# Patient Record
Sex: Male | Born: 1940
Health system: Southern US, Community
[De-identification: ages and names within clinical notes are randomized; demographics above are authoritative.]

## PROBLEM LIST (undated history)

## (undated) DIAGNOSIS — G4730 Sleep apnea, unspecified: Secondary | ICD-10-CM

## (undated) DIAGNOSIS — I509 Heart failure, unspecified: Secondary | ICD-10-CM

## (undated) DIAGNOSIS — I10 Essential (primary) hypertension: Secondary | ICD-10-CM

## (undated) DIAGNOSIS — M1990 Unspecified osteoarthritis, unspecified site: Secondary | ICD-10-CM

## (undated) DIAGNOSIS — Z5189 Encounter for other specified aftercare: Secondary | ICD-10-CM

## (undated) DIAGNOSIS — I5032 Chronic diastolic (congestive) heart failure: Secondary | ICD-10-CM

## (undated) DIAGNOSIS — I517 Cardiomegaly: Secondary | ICD-10-CM

## (undated) DIAGNOSIS — Z860100 Personal history of colon polyps, unspecified: Secondary | ICD-10-CM

## (undated) DIAGNOSIS — Z9989 Dependence on other enabling machines and devices: Secondary | ICD-10-CM

## (undated) DIAGNOSIS — K649 Unspecified hemorrhoids: Secondary | ICD-10-CM

## (undated) DIAGNOSIS — M2619 Other specified anomalies of jaw-cranial base relationship: Secondary | ICD-10-CM

## (undated) DIAGNOSIS — K219 Gastro-esophageal reflux disease without esophagitis: Secondary | ICD-10-CM

## (undated) DIAGNOSIS — N529 Male erectile dysfunction, unspecified: Secondary | ICD-10-CM

## (undated) DIAGNOSIS — M169 Osteoarthritis of hip, unspecified: Secondary | ICD-10-CM

## (undated) DIAGNOSIS — G4733 Obstructive sleep apnea (adult) (pediatric): Secondary | ICD-10-CM

## (undated) HISTORY — DX: Obstructive sleep apnea (adult) (pediatric): G47.33

## (undated) HISTORY — DX: Cardiomegaly: I51.7

## (undated) HISTORY — PX: COLONOSCOPY: SHX174

## (undated) HISTORY — DX: Chronic diastolic (congestive) heart failure: I50.32

## (undated) HISTORY — DX: Heart failure, unspecified: I50.9

## (undated) HISTORY — DX: Morbid (severe) obesity due to excess calories: E66.01

## (undated) HISTORY — DX: Encounter for other specified aftercare: Z51.89

## (undated) HISTORY — DX: Male erectile dysfunction, unspecified: N52.9

## (undated) HISTORY — DX: Dependence on other enabling machines and devices: Z99.89

## (undated) HISTORY — PX: JOINT REPLACEMENT: SHX530

## (undated) HISTORY — PX: KNEE ARTHROSCOPY: SUR90

## (undated) HISTORY — DX: Unspecified hemorrhoids: K64.9

## (undated) HISTORY — DX: Other specified anomalies of jaw-cranial base relationship: M26.19

## (undated) HISTORY — DX: Gastro-esophageal reflux disease without esophagitis: K21.9

## (undated) HISTORY — DX: Osteoarthritis of hip, unspecified: M16.9

---

## 1999-03-03 ENCOUNTER — Encounter: Admission: RE | Admit: 1999-03-03 | Discharge: 1999-03-03 | Payer: Self-pay | Admitting: Family Medicine

## 1999-03-03 ENCOUNTER — Ambulatory Visit (HOSPITAL_COMMUNITY): Admission: RE | Admit: 1999-03-03 | Discharge: 1999-03-03 | Payer: Self-pay | Admitting: Family Medicine

## 1999-03-11 ENCOUNTER — Encounter: Admission: RE | Admit: 1999-03-11 | Discharge: 1999-03-11 | Payer: Self-pay | Admitting: *Deleted

## 1999-03-25 ENCOUNTER — Ambulatory Visit (HOSPITAL_COMMUNITY): Admission: RE | Admit: 1999-03-25 | Discharge: 1999-03-25 | Payer: Self-pay | Admitting: *Deleted

## 1999-03-25 ENCOUNTER — Encounter: Admission: RE | Admit: 1999-03-25 | Discharge: 1999-03-25 | Payer: Self-pay | Admitting: Family Medicine

## 1999-06-02 ENCOUNTER — Encounter: Admission: RE | Admit: 1999-06-02 | Discharge: 1999-06-02 | Payer: Self-pay | Admitting: Family Medicine

## 2000-03-13 ENCOUNTER — Encounter: Payer: Self-pay | Admitting: Emergency Medicine

## 2000-03-13 ENCOUNTER — Emergency Department (HOSPITAL_COMMUNITY): Admission: EM | Admit: 2000-03-13 | Discharge: 2000-03-13 | Payer: Self-pay | Admitting: Emergency Medicine

## 2000-03-22 ENCOUNTER — Encounter: Payer: Self-pay | Admitting: Specialist

## 2000-03-22 ENCOUNTER — Ambulatory Visit (HOSPITAL_COMMUNITY): Admission: RE | Admit: 2000-03-22 | Discharge: 2000-03-22 | Payer: Self-pay | Admitting: Specialist

## 2000-06-16 ENCOUNTER — Encounter: Admission: RE | Admit: 2000-06-16 | Discharge: 2000-06-16 | Payer: Self-pay | Admitting: Family Medicine

## 2000-09-29 ENCOUNTER — Encounter: Admission: RE | Admit: 2000-09-29 | Discharge: 2000-09-29 | Payer: Self-pay | Admitting: Family Medicine

## 2000-10-19 ENCOUNTER — Ambulatory Visit (HOSPITAL_COMMUNITY): Admission: RE | Admit: 2000-10-19 | Discharge: 2000-10-19 | Payer: Self-pay | Admitting: *Deleted

## 2000-11-01 ENCOUNTER — Encounter: Admission: RE | Admit: 2000-11-01 | Discharge: 2000-11-01 | Payer: Self-pay | Admitting: Family Medicine

## 2000-11-16 ENCOUNTER — Encounter: Admission: RE | Admit: 2000-11-16 | Discharge: 2000-11-16 | Payer: Self-pay | Admitting: Family Medicine

## 2000-11-22 ENCOUNTER — Encounter: Admission: RE | Admit: 2000-11-22 | Discharge: 2000-11-22 | Payer: Self-pay | Admitting: Family Medicine

## 2000-12-14 ENCOUNTER — Encounter: Admission: RE | Admit: 2000-12-14 | Discharge: 2000-12-14 | Payer: Self-pay | Admitting: Family Medicine

## 2001-04-12 ENCOUNTER — Encounter: Admission: RE | Admit: 2001-04-12 | Discharge: 2001-04-12 | Payer: Self-pay | Admitting: Family Medicine

## 2003-05-08 ENCOUNTER — Encounter (INDEPENDENT_AMBULATORY_CARE_PROVIDER_SITE_OTHER): Payer: Self-pay | Admitting: Specialist

## 2003-05-08 ENCOUNTER — Ambulatory Visit (HOSPITAL_COMMUNITY): Admission: RE | Admit: 2003-05-08 | Discharge: 2003-05-08 | Payer: Self-pay | Admitting: Gastroenterology

## 2006-10-13 DIAGNOSIS — G4730 Sleep apnea, unspecified: Secondary | ICD-10-CM | POA: Insufficient documentation

## 2006-10-13 DIAGNOSIS — N529 Male erectile dysfunction, unspecified: Secondary | ICD-10-CM

## 2006-10-13 DIAGNOSIS — I5032 Chronic diastolic (congestive) heart failure: Secondary | ICD-10-CM | POA: Insufficient documentation

## 2006-10-13 DIAGNOSIS — I517 Cardiomegaly: Secondary | ICD-10-CM | POA: Insufficient documentation

## 2006-10-13 DIAGNOSIS — I10 Essential (primary) hypertension: Secondary | ICD-10-CM | POA: Insufficient documentation

## 2006-10-13 DIAGNOSIS — K219 Gastro-esophageal reflux disease without esophagitis: Secondary | ICD-10-CM

## 2006-10-13 DIAGNOSIS — E669 Obesity, unspecified: Secondary | ICD-10-CM | POA: Insufficient documentation

## 2006-10-13 DIAGNOSIS — K649 Unspecified hemorrhoids: Secondary | ICD-10-CM

## 2006-10-13 HISTORY — DX: Unspecified hemorrhoids: K64.9

## 2006-10-13 HISTORY — DX: Male erectile dysfunction, unspecified: N52.9

## 2006-10-13 HISTORY — DX: Cardiomegaly: I51.7

## 2006-10-13 HISTORY — DX: Chronic diastolic (congestive) heart failure: I50.32

## 2006-10-13 HISTORY — DX: Gastro-esophageal reflux disease without esophagitis: K21.9

## 2010-05-06 ENCOUNTER — Encounter: Admission: RE | Admit: 2010-05-06 | Discharge: 2010-05-06 | Payer: Self-pay | Admitting: Internal Medicine

## 2011-01-01 NOTE — Op Note (Signed)
NAME:  Kevin Weiss, SPADAFORE                       ACCOUNT NO.:  [id number]   MEDICAL RECORD NO.:  [id number]                   PATIENT TYPE:  AMB   LOCATION:  ENDO                                 FACILITY:  [facility]   PHYSICIAN:  Anselmo Rod, M.D.               DATE OF BIRTH:  [birth year]   DATE OF PROCEDURE:  05/08/2003  DATE OF DISCHARGE:                                 OPERATIVE REPORT   PROCEDURE PERFORMED:  Colonoscopy with snare polypectomy x1.   ENDOSCOPIST:  Anselmo Rod, M.D.   INSTRUMENT USED:  Olympus video colonoscope.   INDICATION FOR PROCEDURE:  One episode of rectal bleeding and a history of  chronic constipation in a 70 year old African-American male with a history  of hypertension.  Rule out colonic polyps, masses, etc.   PREPROCEDURE PREPARATION:  Informed consent was procured from the patient.  The patient was fasted for eight hours prior to the procedure and prepped  with a bottle of magnesium citrate and a gallon of GoLYTELY the night prior  to the procedure.   PREPROCEDURE PHYSICAL:  VITAL SIGNS:  The patient had stable vital signs.  NECK:  Supple.  CHEST:  Clear to auscultation.  S1, S2 regular.  ABDOMEN:  Soft with normal bowel sounds.   DESCRIPTION OF PROCEDURE:  The patient was placed in the left lateral  decubitus position and sedated with 75 mg of Demerol and 7.5 mg of Versed  intravenously.  Once the patient was adequately sedate and maintained on low-  flow oxygen and continuous cardiac monitoring, the Olympus video colonoscope  was advanced from the rectum to the cecum without difficulty.  The  appendiceal orifice and the ileocecal valve were clearly visualized and  photographed.  The terminal ileum appeared normal.  A small sessile polyp  was snared from 60 cm.  Retroflexion in the rectum revealed a small internal  hemorrhoid.  No other abnormalities were noted.  There was no evidence of  diverticulosis.  The patient tolerated the procedure  well without  complications.   IMPRESSION:  1. Small internal hemorrhoid.  2. Small sessile polyp snared at 60 cm.  3. Otherwise normal colonoscopy up to the terminal ileum.   RECOMMENDATIONS:  1. Await pathology results.  2. Continue a high-fiber diet with liberal fluid intake.  3. Avoid nonsteroidals, including aspirin, for the next four weeks.  4. Workup for sleep apnea syndrome as the patient had some drop in his     oxygen saturation during the procedure.  5.     Resume antihypertensive medication.  The patient hypertensive with a blood     pressure of 200/140 when we first started the procedure.  6. Outpatient follow-up in the next two weeks for further recommendations.  Anselmo Rod, M.D.    JNM/MEDQ  D:  05/08/2003  T:  05/08/2003  Job:  [id number]   cc:   Olene Craven, M.D.  [address]  [address]  [place]  [place] [address]  Fax: [phone]

## 2014-01-29 ENCOUNTER — Other Ambulatory Visit: Payer: Self-pay | Admitting: Orthopaedic Surgery

## 2014-02-27 ENCOUNTER — Encounter (HOSPITAL_COMMUNITY): Payer: Self-pay | Admitting: Pharmacy Technician

## 2014-03-01 ENCOUNTER — Ambulatory Visit (HOSPITAL_COMMUNITY)
Admission: RE | Admit: 2014-03-01 | Discharge: 2014-03-01 | Disposition: A | Discharge status: Home / Self Care | Payer: Medicare Other | Source: Ambulatory Visit | Attending: Orthopaedic Surgery | Admitting: Orthopaedic Surgery

## 2014-03-01 ENCOUNTER — Encounter (HOSPITAL_COMMUNITY): Payer: Self-pay

## 2014-03-01 ENCOUNTER — Encounter (HOSPITAL_COMMUNITY)
Admission: RE | Admit: 2014-03-01 | Discharge: 2014-03-01 | Disposition: A | Discharge status: Home / Self Care | Payer: Medicare Other | Source: Ambulatory Visit | Attending: Orthopaedic Surgery | Admitting: Orthopaedic Surgery

## 2014-03-01 DIAGNOSIS — Z01818 Encounter for other preprocedural examination: Secondary | ICD-10-CM | POA: Insufficient documentation

## 2014-03-01 HISTORY — DX: Essential (primary) hypertension: I10

## 2014-03-01 HISTORY — DX: Personal history of colon polyps, unspecified: Z86.0100

## 2014-03-01 HISTORY — DX: Sleep apnea, unspecified: G47.30

## 2014-03-01 HISTORY — DX: Unspecified osteoarthritis, unspecified site: M19.90

## 2014-03-01 HISTORY — DX: Personal history of colonic polyps: Z86.010

## 2014-03-01 LAB — URINALYSIS, ROUTINE W REFLEX MICROSCOPIC
Bilirubin Urine: NEGATIVE
Glucose, UA: NEGATIVE mg/dL
HGB URINE DIPSTICK: NEGATIVE
Ketones, ur: 15 mg/dL — AB
NITRITE: NEGATIVE
PROTEIN: NEGATIVE mg/dL
Specific Gravity, Urine: 1.029 (ref 1.005–1.030)
Urobilinogen, UA: 0.2 mg/dL (ref 0.0–1.0)
pH: 5 (ref 5.0–8.0)

## 2014-03-01 LAB — PROTIME-INR
INR: 1.02 (ref 0.00–1.49)
Prothrombin Time: 13.4 seconds (ref 11.6–15.2)

## 2014-03-01 LAB — CBC WITH DIFFERENTIAL/PLATELET
BASOS ABS: 0 10*3/uL (ref 0.0–0.1)
Basophils Relative: 0 % (ref 0–1)
EOS PCT: 6 % — AB (ref 0–5)
Eosinophils Absolute: 0.3 10*3/uL (ref 0.0–0.7)
HEMATOCRIT: 43.1 % (ref 39.0–52.0)
HEMOGLOBIN: 14.8 g/dL (ref 13.0–17.0)
LYMPHS PCT: 41 % (ref 12–46)
Lymphs Abs: 2.3 10*3/uL (ref 0.7–4.0)
MCH: 29.1 pg (ref 26.0–34.0)
MCHC: 34.3 g/dL (ref 30.0–36.0)
MCV: 84.8 fL (ref 78.0–100.0)
MONO ABS: 0.5 10*3/uL (ref 0.1–1.0)
MONOS PCT: 10 % (ref 3–12)
NEUTROS ABS: 2.4 10*3/uL (ref 1.7–7.7)
Neutrophils Relative %: 43 % (ref 43–77)
Platelets: 201 10*3/uL (ref 150–400)
RBC: 5.08 MIL/uL (ref 4.22–5.81)
RDW: 14.6 % (ref 11.5–15.5)
WBC: 5.5 10*3/uL (ref 4.0–10.5)

## 2014-03-01 LAB — BASIC METABOLIC PANEL
Anion gap: 15 (ref 5–15)
BUN: 14 mg/dL (ref 6–23)
CHLORIDE: 97 meq/L (ref 96–112)
CO2: 25 meq/L (ref 19–32)
CREATININE: 1.13 mg/dL (ref 0.50–1.35)
Calcium: 9.7 mg/dL (ref 8.4–10.5)
GFR calc Af Amer: 74 mL/min — ABNORMAL LOW (ref >90)
GFR calc non Af Amer: 64 mL/min — ABNORMAL LOW (ref >90)
Glucose, Bld: 97 mg/dL (ref 70–99)
Potassium: 4.2 mEq/L (ref 3.7–5.3)
Sodium: 137 mEq/L (ref 137–147)

## 2014-03-01 LAB — SURGICAL PCR SCREEN
MRSA, PCR: NEGATIVE
Staphylococcus aureus: NEGATIVE

## 2014-03-01 LAB — APTT: aPTT: 28 seconds (ref 24–37)

## 2014-03-01 LAB — URINE MICROSCOPIC-ADD ON

## 2014-03-01 LAB — TYPE AND SCREEN
ABO/RH(D): B POS
ANTIBODY SCREEN: NEGATIVE

## 2014-03-01 LAB — ABO/RH: ABO/RH(D): B POS

## 2014-03-01 IMAGING — CR DG CHEST 2V
2 series · 2 of 2 positions shown · non-contrast
Comparison: None.

CLINICAL DATA: Preoperative evaluation for hip replacement

EXAM:
CHEST  2 VIEW

[w chest pa]
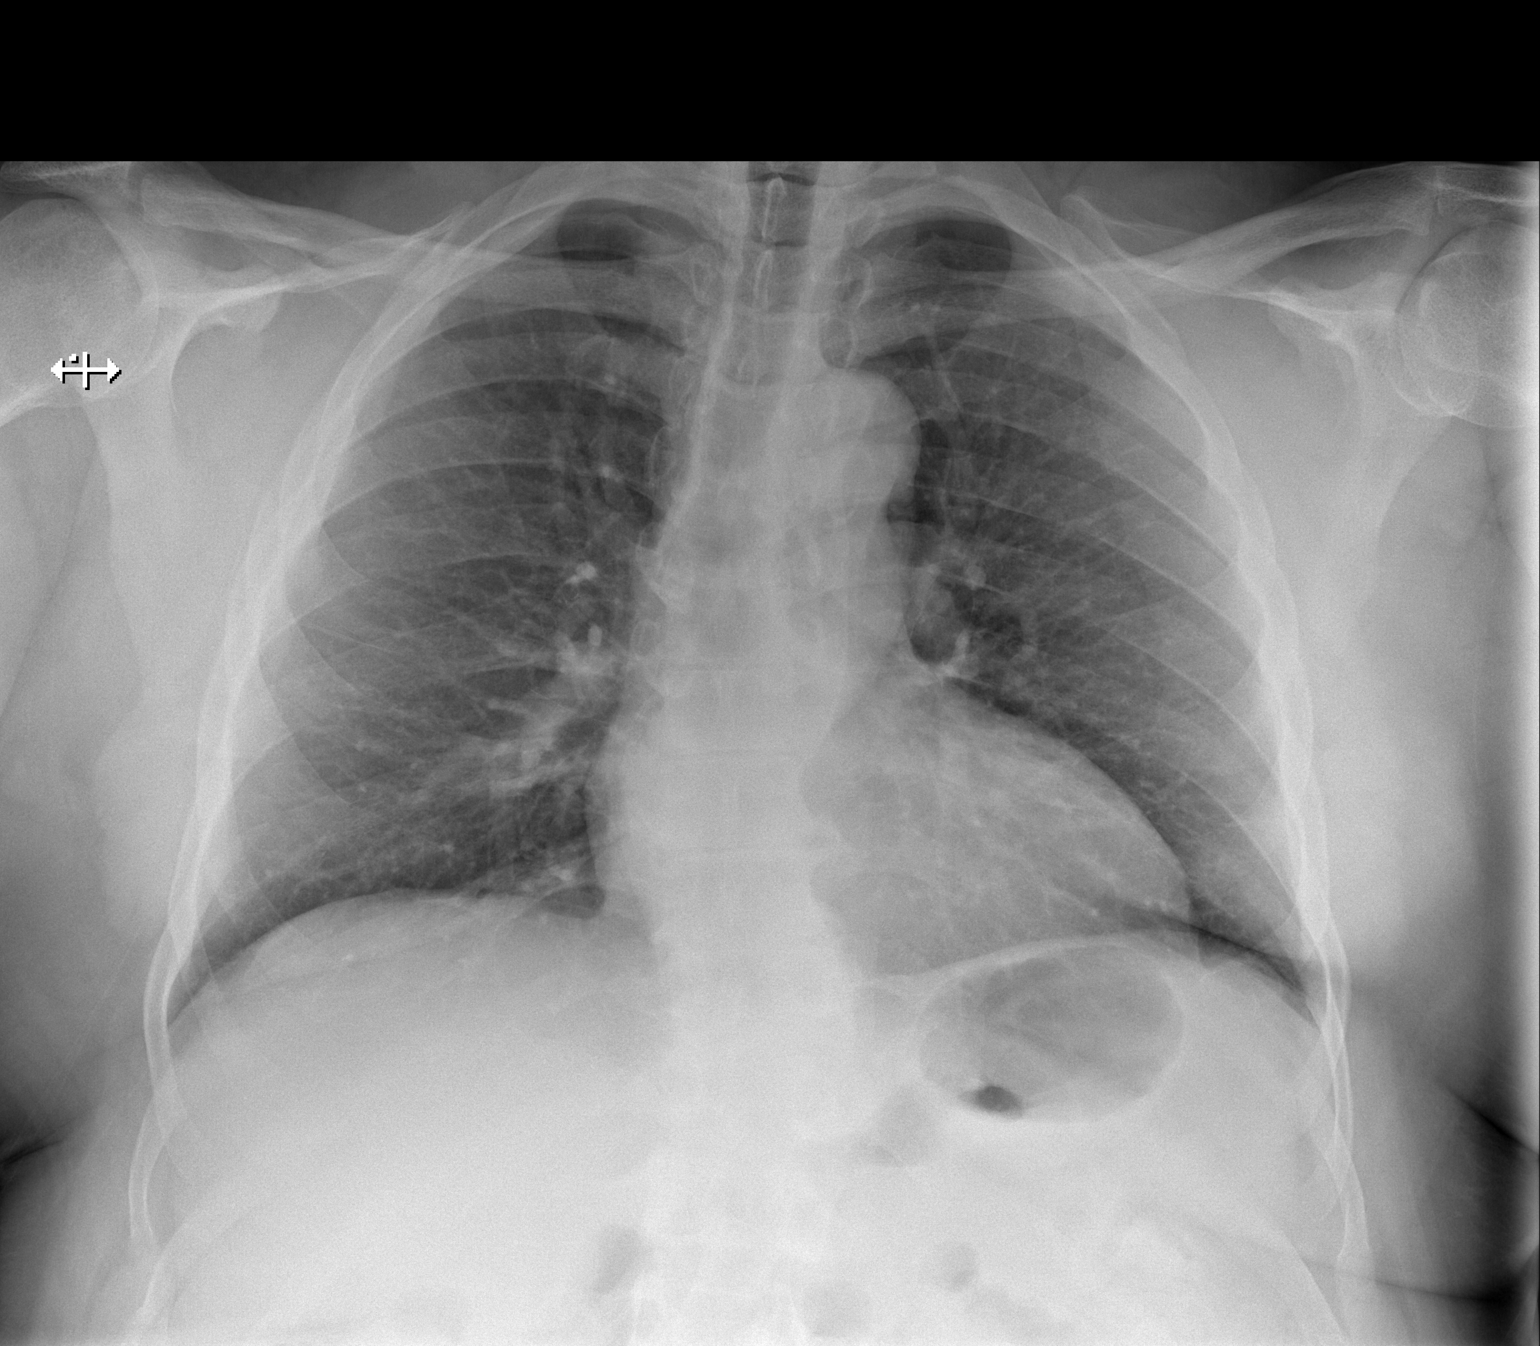

[w chest lat]
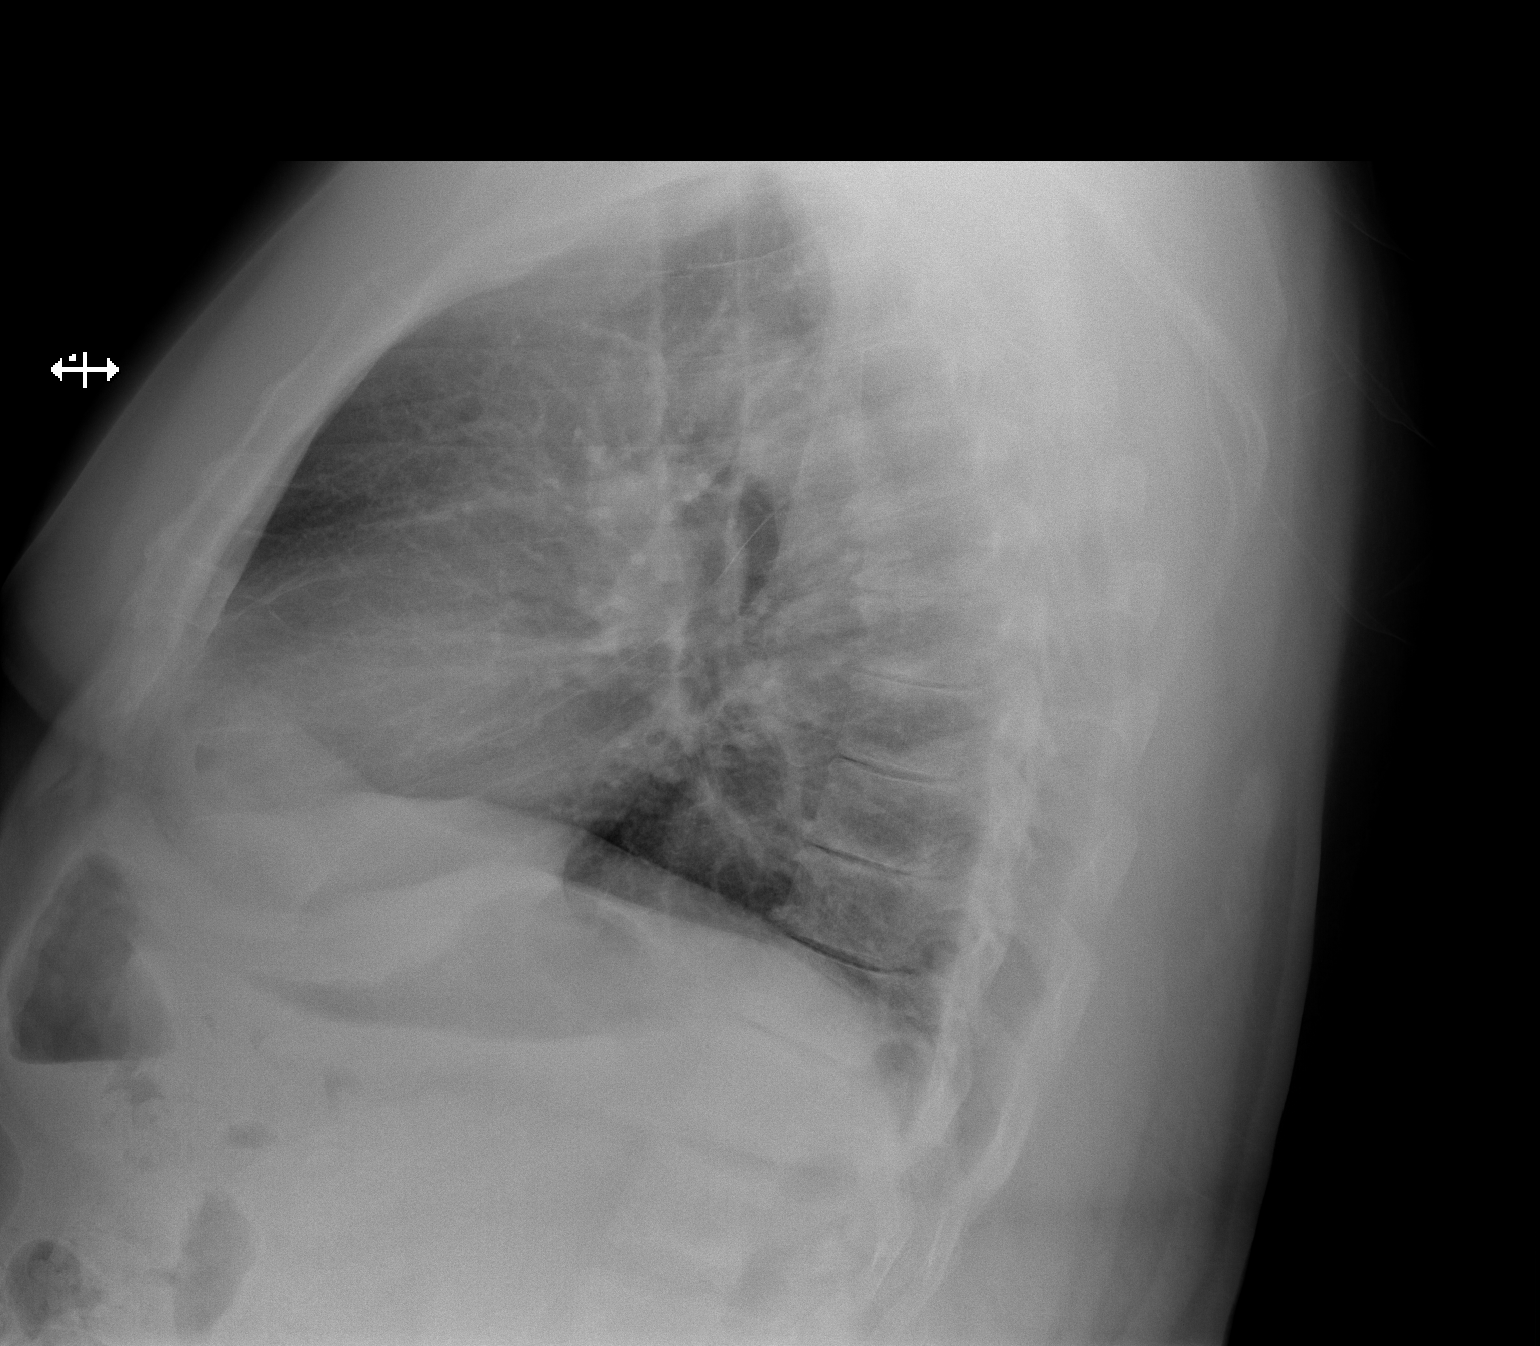

[2 of 2 positions shown; findings below may reference images not displayed]

FINDINGS: The heart size and mediastinal contours are within normal limits.
Both lungs are clear. The visualized skeletal structures are
unremarkable.
IMPRESSION: No active cardiopulmonary disease.

## 2014-03-01 MED ORDER — CHLORHEXIDINE GLUCONATE 4 % EX LIQD: 60.0000 mL | Freq: Once | CUTANEOUS | Status: DC

## 2014-03-01 NOTE — Pre-Procedure Instructions (Signed)
Kevin Weiss  03/01/2014   Your procedure is scheduled on:  Tues, July 28 @ 7:30 AM  Report to [facility]  at 5:30 AM.  Call this number if you have problems the morning of surgery: [phone]   Remember:   Do not eat food or drink liquids after midnight.   Take these medicines the morning of surgery with A SIP OF WATER: Amlodipine(Norvasc)               Stop taking your Aspirin a week before surgery. No Goody's,BC's,Aleve,Ibuprofen,Fish Oil,or any Herbal Medications   Do not wear jewelry  Do not wear lotions, powders, or colognes. You may wear deodorant.  Men may shave face and neck.  Do not bring valuables to the hospital.  [facility] is not responsible                  for any belongings or valuables.               Contacts, dentures or bridgework may not be worn into surgery.  Leave suitcase in the car. After surgery it may be brought to your room.  For patients admitted to the hospital, discharge time is determined by your                treatment team.               Special Instructions:  [place] - Preparing for Surgery  Before surgery, you can play an important role.  Because skin is not sterile, your skin needs to be as free of germs as possible.  You can reduce the number of germs on you skin by washing with CHG (chlorahexidine gluconate) soap before surgery.  CHG is an antiseptic cleaner which kills germs and bonds with the skin to continue killing germs even after washing.  Please DO NOT use if you have an allergy to CHG or antibacterial soaps.  If your skin becomes reddened/irritated stop using the CHG and inform your nurse when you arrive at Short Stay.  Do not shave (including legs and underarms) for at least 48 hours prior to the first CHG shower.  You may shave your face.  Please follow these instructions carefully:   1.  Shower with CHG Soap the night before surgery and the                                morning of Surgery.  2.  If you choose  to wash your hair, wash your hair first as usual with your       normal shampoo.  3.  After you shampoo, rinse your hair and body thoroughly to remove the                      Shampoo.  4.  Use CHG as you would any other liquid soap.  You can apply chg directly       to the skin and wash gently with scrungie or a clean washcloth.  5.  Apply the CHG Soap to your body ONLY FROM THE NECK DOWN.        Do not use on open wounds or open sores.  Avoid contact with your eyes,       ears, mouth and genitals (private parts).  Wash genitals (private parts)       with your normal soap.  6.  Wash thoroughly, paying special attention to the area where your surgery        will be performed.  7.  Thoroughly rinse your body with warm water from the neck down.  8.  DO NOT shower/wash with your normal soap after using and rinsing off       the CHG Soap.  9.  Pat yourself dry with a clean towel.            10.  Wear clean pajamas.            11.  Place clean sheets on your bed the night of your first shower and do not        sleep with pets.  Day of Surgery  Do not apply any lotions/deoderants the morning of surgery.  Please wear clean clothes to the hospital/surgery center.     Please read over the following fact sheets that you were given: Pain Booklet, Coughing and Deep Breathing, Blood Transfusion Information, MRSA Information and Surgical Site Infection Prevention

## 2014-03-01 NOTE — Progress Notes (Signed)
Sleep study done > 29yrs ago

## 2014-03-01 NOTE — Progress Notes (Signed)
Cardiologist is Dr.Gangi with last visit over a yr ago-to request report   Stress test done a little over a yr ago-to request from [facility]  Echo to also be requested from [facility]  Denies ever having heart cath  Denies ekg or cxr in past yr  Medical Md is Dr.Robyn [facility]

## 2014-03-01 NOTE — Progress Notes (Signed)
Anesthesia Chart Review:  Patient is a 73 year old male scheduled for right THA, anterior on 03/12/14 by Dr. Rhona Raider.  History includes non-smoker, OSA, HTN, murmur of infancy, colon polyps, arthritis. BMI is consistent with obesity. PCP is Dr. Glendale Chard.  He was evaluated by cardiologist Dr. Einar Gip in 2013 for chest pain. His ETT was negative.  Echo showed normal LVF, grade 1 diastolic dysfunction, mild to moderate pulmonary hypertension.  With BP control and weight loss, his symptoms resolved, and on 03/09/12 Dr. Einar Gip recommended PRN cardiology follow-up with continued primary prevention.  EKG on 03/01/14 showed: NSR, right BBB, LAFB, bifascicular block, LVH.  His EKG appears stable when compared to prior tracing on 12/15/2011 [facility] CV).    Exercise stress test on 01/04/12 [facility] CV) was negative for ischemia, normal BP response. Aggressive risk modification recommended.   Echo on 12/23/11 [facility] CV) showed: LV cavity is normal in size, diastolic filling with impaired relaxation pattern and normal to low pressure. Normal global wall motion. Normal LV systolic function, EF 16%. Grade I (impaired) diastolic dysfunction. Mild TR.  Mild to moderate pulmonary hypertension. RVSP 43.1 mmHg.  According to Dr. Irven Shelling notes, [facility] 11/15/11: [facility] CAD gene expression score of 21. Likelihood of CAD 32%.   Preoperative CXR and labs noted.   His EKG appears stable since at least 12/2011.  He had a cardiology evaluation at that time as outlined above and PRN follow-up was recommended.  If no acute changes then I would anticipate that he could proceed as planned.   [facility]/Anesthesiology Phone [phone] 03/01/2014 2:50 PM

## 2014-03-08 NOTE — H&P (Signed)
TOTAL HIP ADMISSION H&P  Patient is admitted for right total hip arthroplasty.  Subjective:  Chief Complaint: right hip pain  HPI: Kevin Weiss, 73 y.o. male, has a history of pain and functional disability in the right hip(s) due to arthritis and patient has failed non-surgical conservative treatments for greater than 12 weeks to include NSAID's and/or analgesics, flexibility and strengthening excercises, use of assistive devices, weight reduction as appropriate and activity modification.  Onset of symptoms was gradual starting 5 years ago with gradually worsening course since that time.The patient noted no past surgery on the right hip(s).  Patient currently rates pain in the right hip at 10 out of 10 with activity. Patient has night pain, worsening of pain with activity and weight bearing, trendelenberg gait, pain that interfers with activities of daily living and pain with passive range of motion. Patient has evidence of subchondral cysts, periarticular osteophytes and joint space narrowing by imaging studies. This condition presents safety issues increasing the risk of falls. This patient has had no.  There is no current active infection.  Patient Active Problem List   Diagnosis Date Noted  . OBESITY, NOS 10/13/2006  . HYPERTENSION, BENIGN SYSTEMIC 10/13/2006  . CHF, EJECTION FRACTION > OR = 50% 10/13/2006  . CARDIOMEGALY 10/13/2006  . HEMORRHOIDS, NOS 10/13/2006  . GASTROESOPHAGEAL REFLUX, NO ESOPHAGITIS 10/13/2006  . 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 APNEA, SLEEP 10/13/2006   Past Medical History  Diagnosis Date  . Hypertension     takes Amlodipine and Prinizide daily  . Heart murmur at birth   . Arthritis   . Joint pain   . History of colon polyps   . Sleep apnea     Past Surgical History  Procedure Laterality Date  . Knee arthroscopy Left 15+yrs ago  . Colonoscopy      No prescriptions prior to admission   No Known Allergies  History  Substance Use Topics  .  Smoking status: Never Smoker   . Smokeless tobacco: Not on file  . Alcohol Use: Yes     Comment: rarely    No family history on file.   Review of Systems  Constitutional: Negative.   HENT: Negative.   Eyes: Negative.   Respiratory: Negative.   Cardiovascular: Negative.   Gastrointestinal: Negative.   Genitourinary: Negative.   Musculoskeletal: Positive for joint pain.  Skin: Negative.   Neurological: Negative.   Endo/Heme/Allergies: Negative.   Psychiatric/Behavioral: Negative.     Objective:  Physical Exam  Constitutional: He appears well-developed.  HENT:  Head: Normocephalic.  Eyes: Pupils are equal, round, and reactive to light.  Neck: Normal range of motion.  Cardiovascular: Normal rate.   Respiratory: Effort normal.  GI: Soft.  Musculoskeletal:  Right hip motion extremely limited and painful.  Moderate pain and noted forward flexion.  Internal rotation very limited and painful.  Good neurovascular status otherwise negative Homans.  Neurological: He is alert.  Skin: Skin is warm.  Psychiatric: He has a normal mood and affect.    Vital signs in last 24 hours:    Labs:   There is no height or weight on file to calculate BMI.   Imaging Review Plain radiographs demonstrate severe degenerative joint disease of the right hip(s). The bone quality appears to be good for age and reported activity level.  Assessment/Plan:  End stage arthritis, right hip(s)  The patient history, physical examination, clinical judgement of the provider and imaging studies are consistent with end stage degenerative joint disease of the  right hip(s) and total hip arthroplasty is deemed medically necessary. The treatment options including medical management, injection therapy, arthroscopy and arthroplasty were discussed at length. The risks and benefits of total hip arthroplasty were presented and reviewed. The risks due to aseptic loosening, infection, stiffness,  dislocation/subluxation,  thromboembolic complications and other imponderables were discussed.  The patient acknowledged the explanation, agreed to proceed with the plan and consent was signed. Patient is being admitted for inpatient treatment for surgery, pain control, PT, OT, prophylactic antibiotics, VTE prophylaxis, progressive ambulation and ADL's and discharge planning.The patient is planning to be discharged home with home health services

## 2014-03-11 MED ORDER — CEFAZOLIN SODIUM-DEXTROSE 2-3 GM-% IV SOLR
2.0000 g | INTRAVENOUS | Status: AC
  Administered 2014-03-12: 2 g via INTRAVENOUS
  Filled 2014-03-11: qty 50

## 2014-03-12 ENCOUNTER — Inpatient Hospital Stay (HOSPITAL_COMMUNITY)
Admission: RE | Admit: 2014-03-12 | Discharge: 2014-03-14 | DRG: 470 | Disposition: A | Discharge status: Home Health | Payer: Medicare Other | Source: Ambulatory Visit | Attending: Orthopaedic Surgery | Admitting: Orthopaedic Surgery

## 2014-03-12 ENCOUNTER — Encounter (HOSPITAL_COMMUNITY): Payer: Self-pay | Admitting: *Deleted

## 2014-03-12 ENCOUNTER — Encounter (HOSPITAL_COMMUNITY): Payer: Medicare Other | Admitting: Vascular Surgery

## 2014-03-12 ENCOUNTER — Encounter (HOSPITAL_COMMUNITY)
Admission: RE | Disposition: A | Discharge status: Home Health | Payer: Self-pay | Source: Ambulatory Visit | Attending: Orthopaedic Surgery

## 2014-03-12 ENCOUNTER — Inpatient Hospital Stay (HOSPITAL_COMMUNITY): Payer: Medicare Other

## 2014-03-12 ENCOUNTER — Inpatient Hospital Stay (HOSPITAL_COMMUNITY): Payer: Medicare Other | Admitting: Anesthesiology

## 2014-03-12 DIAGNOSIS — I10 Essential (primary) hypertension: Secondary | ICD-10-CM | POA: Diagnosis present

## 2014-03-12 DIAGNOSIS — M1610 Unilateral primary osteoarthritis, unspecified hip: Principal | ICD-10-CM | POA: Diagnosis present

## 2014-03-12 DIAGNOSIS — R011 Cardiac murmur, unspecified: Secondary | ICD-10-CM | POA: Diagnosis present

## 2014-03-12 DIAGNOSIS — M169 Osteoarthritis of hip, unspecified: Secondary | ICD-10-CM

## 2014-03-12 DIAGNOSIS — M25559 Pain in unspecified hip: Secondary | ICD-10-CM | POA: Diagnosis present

## 2014-03-12 DIAGNOSIS — I509 Heart failure, unspecified: Secondary | ICD-10-CM | POA: Diagnosis present

## 2014-03-12 DIAGNOSIS — R0681 Apnea, not elsewhere classified: Secondary | ICD-10-CM | POA: Diagnosis present

## 2014-03-12 DIAGNOSIS — K219 Gastro-esophageal reflux disease without esophagitis: Secondary | ICD-10-CM | POA: Diagnosis present

## 2014-03-12 DIAGNOSIS — M1611 Unilateral primary osteoarthritis, right hip: Secondary | ICD-10-CM

## 2014-03-12 DIAGNOSIS — E669 Obesity, unspecified: Secondary | ICD-10-CM | POA: Diagnosis present

## 2014-03-12 HISTORY — PX: TOTAL HIP ARTHROPLASTY: SHX124

## 2014-03-12 HISTORY — DX: Osteoarthritis of hip, unspecified: M16.9

## 2014-03-12 IMAGING — RF DG HIP OPERATIVE*R*
1 series · 2 of 2 positions shown · non-contrast
Comparison: None.

CLINICAL DATA: Follow right anterior hip arthroplasty

EXAM:
DG OPERATIVE RIGHT HIP
TECHNIQUE: Spot fluoroscopic AP images of the pubic symphysis and right hip are
submitted.

[Series 1: run · 2 of 2 slices shown]
[im 1/2]
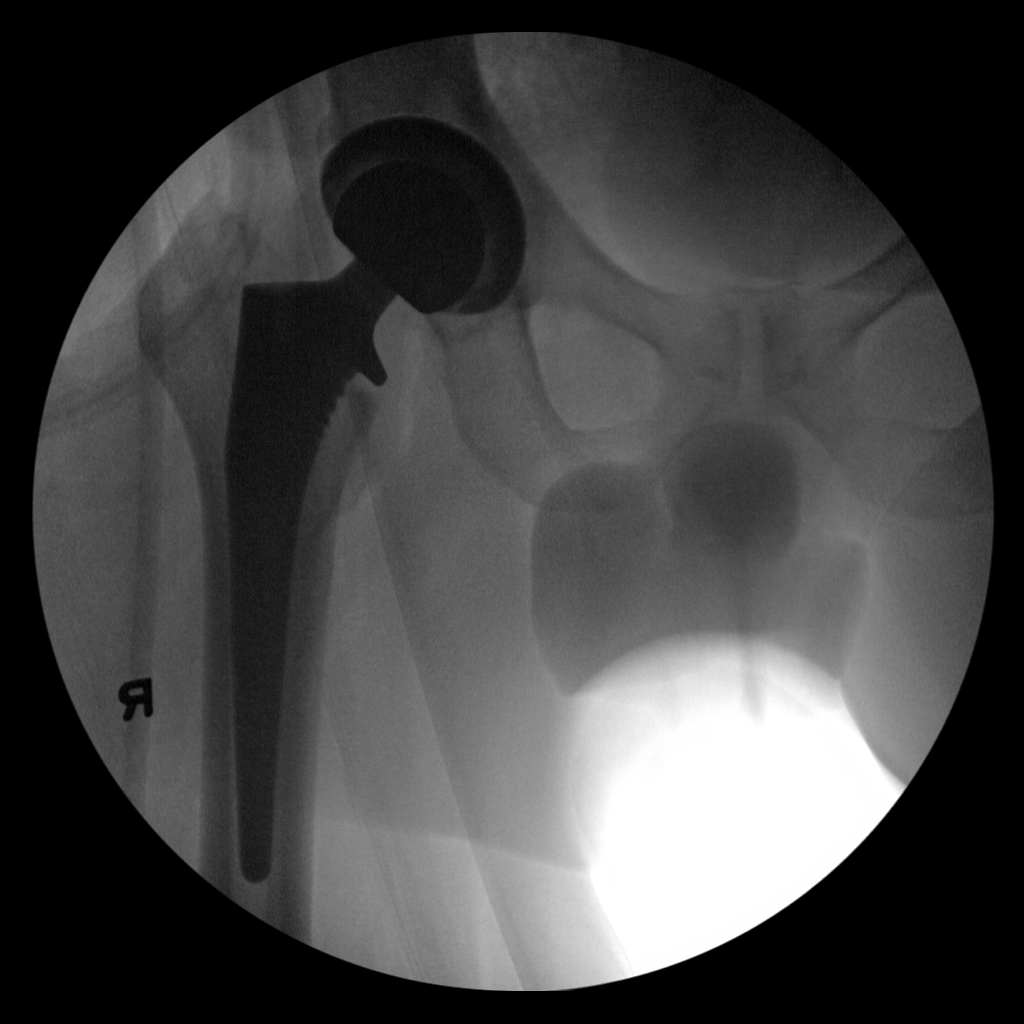
[im 2/2]
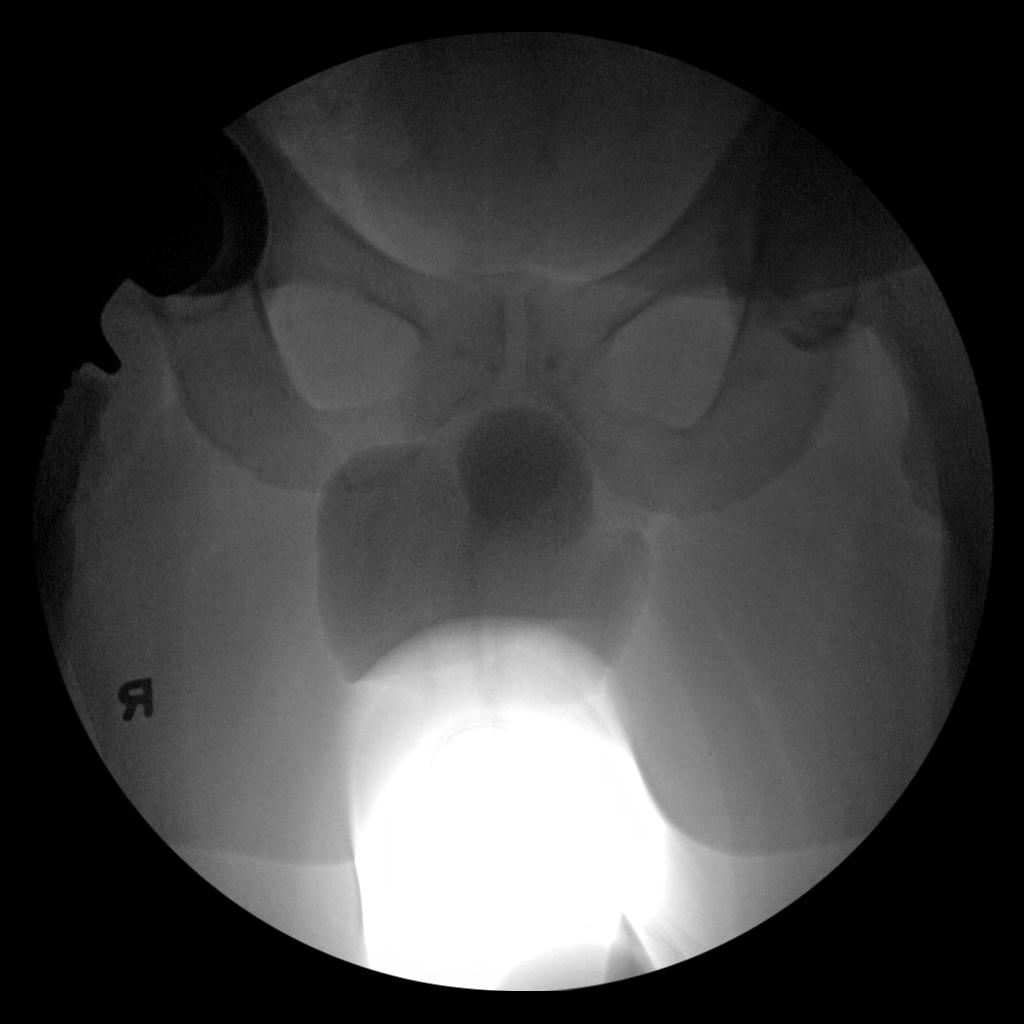

[2 of 2 positions shown; findings below may reference images not displayed]

FINDINGS: Intraoperative film of a right hip arthroplasty. No appreciable
periprosthetic lucency. Hardware projects over the expected
position. No rami fracture.
IMPRESSION: Intraoperative spot fluoroscopic images demonstrating right hip
arthroplasty as above.

## 2014-03-12 SURGERY — ARTHROPLASTY, HIP, TOTAL, ANTERIOR APPROACH
Anesthesia: General | Laterality: Right

## 2014-03-12 MED ORDER — HYDROMORPHONE HCL PF 1 MG/ML IJ SOLN
INTRAMUSCULAR | Status: AC
  Filled 2014-03-12: qty 1

## 2014-03-12 MED ORDER — LIDOCAINE HCL (CARDIAC) 20 MG/ML IV SOLN
INTRAVENOUS | Status: DC | PRN
  Administered 2014-03-12: 80 mg via INTRAVENOUS

## 2014-03-12 MED ORDER — NEOSTIGMINE METHYLSULFATE 10 MG/10ML IV SOLN
INTRAVENOUS | Status: DC | PRN
  Administered 2014-03-12: 3 mg via INTRAVENOUS

## 2014-03-12 MED ORDER — METHOCARBAMOL 1000 MG/10ML IJ SOLN
500.0000 mg | Freq: Four times a day (QID) | INTRAVENOUS | Status: DC | PRN
  Filled 2014-03-12: qty 5

## 2014-03-12 MED ORDER — LABETALOL HCL 5 MG/ML IV SOLN
INTRAVENOUS | Status: AC
  Filled 2014-03-12: qty 4

## 2014-03-12 MED ORDER — DOCUSATE SODIUM 100 MG PO CAPS
100.0000 mg | ORAL_CAPSULE | Freq: Two times a day (BID) | ORAL | Status: DC
  Administered 2014-03-12 – 2014-03-14 (×5): 100 mg via ORAL
  Filled 2014-03-12 (×6): qty 1

## 2014-03-12 MED ORDER — METHOCARBAMOL 500 MG PO TABS
ORAL_TABLET | ORAL | Status: AC
  Filled 2014-03-12: qty 1

## 2014-03-12 MED ORDER — LABETALOL HCL 5 MG/ML IV SOLN
INTRAVENOUS | Status: DC | PRN
  Administered 2014-03-12: 10 mg via INTRAVENOUS

## 2014-03-12 MED ORDER — PHENYLEPHRINE 40 MCG/ML (10ML) SYRINGE FOR IV PUSH (FOR BLOOD PRESSURE SUPPORT)
PREFILLED_SYRINGE | INTRAVENOUS | Status: AC
  Filled 2014-03-12: qty 10

## 2014-03-12 MED ORDER — PHENYLEPHRINE HCL 10 MG/ML IJ SOLN
INTRAMUSCULAR | Status: DC | PRN
  Administered 2014-03-12 (×2): 80 ug via INTRAVENOUS

## 2014-03-12 MED ORDER — GLYCOPYRROLATE 0.2 MG/ML IJ SOLN
INTRAMUSCULAR | Status: AC
  Filled 2014-03-12: qty 2

## 2014-03-12 MED ORDER — CLOMIPHENE CITRATE 50 MG PO TABS
25.0000 mg | ORAL_TABLET | Freq: Every day | ORAL | Status: DC
  Administered 2014-03-13: 25 mg via ORAL
  Filled 2014-03-12 (×4): qty 1

## 2014-03-12 MED ORDER — PROPOFOL 10 MG/ML IV BOLUS
INTRAVENOUS | Status: DC | PRN
  Administered 2014-03-12: 80 mg via INTRAVENOUS
  Administered 2014-03-12: 200 mg via INTRAVENOUS

## 2014-03-12 MED ORDER — ONDANSETRON HCL 4 MG/2ML IJ SOLN: 4.0000 mg | Freq: Four times a day (QID) | INTRAMUSCULAR | Status: DC | PRN

## 2014-03-12 MED ORDER — AMLODIPINE BESYLATE 5 MG PO TABS
5.0000 mg | ORAL_TABLET | Freq: Every day | ORAL | Status: DC
  Administered 2014-03-12 – 2014-03-14 (×3): 5 mg via ORAL
  Filled 2014-03-12 (×3): qty 1

## 2014-03-12 MED ORDER — LACTATED RINGERS IV SOLN
INTRAVENOUS | Status: DC
  Administered 2014-03-12 (×2): via INTRAVENOUS

## 2014-03-12 MED ORDER — DIPHENHYDRAMINE HCL 12.5 MG/5ML PO ELIX: 12.5000 mg | ORAL_SOLUTION | ORAL | Status: DC | PRN

## 2014-03-12 MED ORDER — ALUM & MAG HYDROXIDE-SIMETH 200-200-20 MG/5ML PO SUSP: 30.0000 mL | ORAL | Status: DC | PRN

## 2014-03-12 MED ORDER — HYDROCODONE-ACETAMINOPHEN 5-325 MG PO TABS
1.0000 | ORAL_TABLET | ORAL | Status: DC | PRN
  Administered 2014-03-12 – 2014-03-13 (×5): 2 via ORAL
  Filled 2014-03-12 (×5): qty 2

## 2014-03-12 MED ORDER — ASPIRIN EC 325 MG PO TBEC
325.0000 mg | DELAYED_RELEASE_TABLET | Freq: Two times a day (BID) | ORAL | Status: DC
  Administered 2014-03-13 – 2014-03-14 (×3): 325 mg via ORAL
  Filled 2014-03-12 (×5): qty 1

## 2014-03-12 MED ORDER — EPHEDRINE SULFATE 50 MG/ML IJ SOLN
INTRAMUSCULAR | Status: AC
  Filled 2014-03-12: qty 1

## 2014-03-12 MED ORDER — LACTATED RINGERS IV SOLN
INTRAVENOUS | Status: DC | PRN
  Administered 2014-03-12 (×3): via INTRAVENOUS

## 2014-03-12 MED ORDER — ACETAMINOPHEN 650 MG RE SUPP: 650.0000 mg | Freq: Four times a day (QID) | RECTAL | Status: DC | PRN

## 2014-03-12 MED ORDER — SUCCINYLCHOLINE CHLORIDE 20 MG/ML IJ SOLN
INTRAMUSCULAR | Status: DC | PRN
  Administered 2014-03-12: 140 mg via INTRAVENOUS

## 2014-03-12 MED ORDER — METOCLOPRAMIDE HCL 10 MG PO TABS
5.0000 mg | ORAL_TABLET | Freq: Three times a day (TID) | ORAL | Status: DC | PRN
Start: 2014-03-12 — End: 2014-03-14

## 2014-03-12 MED ORDER — FENTANYL CITRATE 0.05 MG/ML IJ SOLN
INTRAMUSCULAR | Status: AC
  Filled 2014-03-12: qty 5

## 2014-03-12 MED ORDER — BISACODYL 5 MG PO TBEC
5.0000 mg | DELAYED_RELEASE_TABLET | Freq: Every day | ORAL | Status: DC | PRN
  Administered 2014-03-13: 5 mg via ORAL
  Filled 2014-03-12: qty 1

## 2014-03-12 MED ORDER — ACETAMINOPHEN 325 MG PO TABS: 650.0000 mg | ORAL_TABLET | Freq: Four times a day (QID) | ORAL | Status: DC | PRN

## 2014-03-12 MED ORDER — TRANEXAMIC ACID 100 MG/ML IV SOLN
1000.0000 mg | INTRAVENOUS | Status: AC
  Administered 2014-03-12: 1000 mg via INTRAVENOUS
  Filled 2014-03-12: qty 10

## 2014-03-12 MED ORDER — CEFAZOLIN SODIUM-DEXTROSE 2-3 GM-% IV SOLR
2.0000 g | Freq: Four times a day (QID) | INTRAVENOUS | Status: AC
  Administered 2014-03-12 (×2): 2 g via INTRAVENOUS
  Filled 2014-03-12 (×2): qty 50

## 2014-03-12 MED ORDER — ONDANSETRON HCL 4 MG/2ML IJ SOLN
INTRAMUSCULAR | Status: AC
  Filled 2014-03-12: qty 2

## 2014-03-12 MED ORDER — PROPOFOL 10 MG/ML IV BOLUS
INTRAVENOUS | Status: AC
  Filled 2014-03-12: qty 20

## 2014-03-12 MED ORDER — OXYCODONE HCL 5 MG PO TABS
ORAL_TABLET | ORAL | Status: AC
  Filled 2014-03-12: qty 1

## 2014-03-12 MED ORDER — NEOSTIGMINE METHYLSULFATE 10 MG/10ML IV SOLN
INTRAVENOUS | Status: AC
  Filled 2014-03-12: qty 1

## 2014-03-12 MED ORDER — OXYCODONE HCL 5 MG PO TABS
5.0000 mg | ORAL_TABLET | Freq: Once | ORAL | Status: AC | PRN
  Administered 2014-03-12: 5 mg via ORAL

## 2014-03-12 MED ORDER — MIDAZOLAM HCL 5 MG/5ML IJ SOLN
INTRAMUSCULAR | Status: DC | PRN
  Administered 2014-03-12 (×2): 1 mg via INTRAVENOUS

## 2014-03-12 MED ORDER — GLYCOPYRROLATE 0.2 MG/ML IJ SOLN
INTRAMUSCULAR | Status: DC | PRN
  Administered 2014-03-12: 0.4 mg via INTRAVENOUS

## 2014-03-12 MED ORDER — HYDROCHLOROTHIAZIDE 12.5 MG PO CAPS
12.5000 mg | ORAL_CAPSULE | Freq: Every day | ORAL | Status: DC
  Administered 2014-03-12 – 2014-03-14 (×3): 12.5 mg via ORAL
  Filled 2014-03-12 (×3): qty 1

## 2014-03-12 MED ORDER — SODIUM CHLORIDE 0.9 % IJ SOLN
INTRAMUSCULAR | Status: AC
  Filled 2014-03-12: qty 10

## 2014-03-12 MED ORDER — ROCURONIUM BROMIDE 50 MG/5ML IV SOLN
INTRAVENOUS | Status: AC
  Filled 2014-03-12: qty 1

## 2014-03-12 MED ORDER — FENTANYL CITRATE 0.05 MG/ML IJ SOLN
INTRAMUSCULAR | Status: DC | PRN
  Administered 2014-03-12 (×2): 50 ug via INTRAVENOUS
  Administered 2014-03-12 (×2): 100 ug via INTRAVENOUS
  Administered 2014-03-12: 150 ug via INTRAVENOUS
  Administered 2014-03-12: 50 ug via INTRAVENOUS

## 2014-03-12 MED ORDER — 0.9 % SODIUM CHLORIDE (POUR BTL) OPTIME
TOPICAL | Status: DC | PRN
  Administered 2014-03-12: 1000 mL

## 2014-03-12 MED ORDER — ROCURONIUM BROMIDE 100 MG/10ML IV SOLN
INTRAVENOUS | Status: DC | PRN
  Administered 2014-03-12: 50 mg via INTRAVENOUS

## 2014-03-12 MED ORDER — PHENOL 1.4 % MT LIQD: 1.0000 | OROMUCOSAL | Status: DC | PRN

## 2014-03-12 MED ORDER — LISINOPRIL 10 MG PO TABS
10.0000 mg | ORAL_TABLET | Freq: Every day | ORAL | Status: DC
  Administered 2014-03-12 – 2014-03-14 (×3): 10 mg via ORAL
  Filled 2014-03-12 (×3): qty 1

## 2014-03-12 MED ORDER — LIDOCAINE HCL (CARDIAC) 20 MG/ML IV SOLN
INTRAVENOUS | Status: AC
  Filled 2014-03-12: qty 5

## 2014-03-12 MED ORDER — ONDANSETRON HCL 4 MG/2ML IJ SOLN: 4.0000 mg | Freq: Once | INTRAMUSCULAR | Status: DC | PRN

## 2014-03-12 MED ORDER — LISINOPRIL-HYDROCHLOROTHIAZIDE 10-12.5 MG PO TABS: 1.0000 | ORAL_TABLET | Freq: Every day | ORAL | Status: DC

## 2014-03-12 MED ORDER — ONDANSETRON HCL 4 MG/2ML IJ SOLN
INTRAMUSCULAR | Status: DC | PRN
  Administered 2014-03-12: 4 mg via INTRAVENOUS

## 2014-03-12 MED ORDER — VITAMIN D 1000 UNITS PO TABS
1000.0000 [IU] | ORAL_TABLET | Freq: Every day | ORAL | Status: DC
  Administered 2014-03-12 – 2014-03-14 (×3): 1000 [IU] via ORAL
  Filled 2014-03-12 (×3): qty 1

## 2014-03-12 MED ORDER — LACTATED RINGERS IV SOLN: INTRAVENOUS | Status: DC

## 2014-03-12 MED ORDER — METOCLOPRAMIDE HCL 5 MG/ML IJ SOLN: 5.0000 mg | Freq: Three times a day (TID) | INTRAMUSCULAR | Status: DC | PRN

## 2014-03-12 MED ORDER — MIDAZOLAM HCL 2 MG/2ML IJ SOLN
INTRAMUSCULAR | Status: AC
  Filled 2014-03-12: qty 2

## 2014-03-12 MED ORDER — MENTHOL 3 MG MT LOZG: 1.0000 | LOZENGE | OROMUCOSAL | Status: DC | PRN

## 2014-03-12 MED ORDER — HYDROMORPHONE HCL PF 1 MG/ML IJ SOLN
0.2500 mg | INTRAMUSCULAR | Status: DC | PRN
  Administered 2014-03-12 (×4): 0.5 mg via INTRAVENOUS

## 2014-03-12 MED ORDER — SUCCINYLCHOLINE CHLORIDE 20 MG/ML IJ SOLN
INTRAMUSCULAR | Status: AC
Start: 2014-03-12 — End: 2014-03-12
  Filled 2014-03-12: qty 1

## 2014-03-12 MED ORDER — ONDANSETRON HCL 4 MG PO TABS: 4.0000 mg | ORAL_TABLET | Freq: Four times a day (QID) | ORAL | Status: DC | PRN

## 2014-03-12 MED ORDER — METHOCARBAMOL 500 MG PO TABS
500.0000 mg | ORAL_TABLET | Freq: Four times a day (QID) | ORAL | Status: DC | PRN
  Administered 2014-03-12 – 2014-03-14 (×5): 500 mg via ORAL
  Filled 2014-03-12 (×5): qty 1

## 2014-03-12 MED ORDER — OXYCODONE HCL 5 MG/5ML PO SOLN: 5.0000 mg | Freq: Once | ORAL | Status: AC | PRN

## 2014-03-12 MED ORDER — HYDROMORPHONE HCL PF 1 MG/ML IJ SOLN
0.5000 mg | INTRAMUSCULAR | Status: DC | PRN
  Administered 2014-03-13 (×3): 1 mg via INTRAVENOUS
  Filled 2014-03-12 (×3): qty 1

## 2014-03-12 SURGICAL SUPPLY — 47 items
BLADE SAW SGTL 18X1.27X75 (BLADE) ×2 IMPLANT
BLADE SURG ROTATE 9660 (MISCELLANEOUS) IMPLANT
CAPT HIP PF MOP ×1 IMPLANT
CELLS DAT CNTRL 66122 CELL SVR (MISCELLANEOUS) ×1 IMPLANT
COVER PERINEAL POST (MISCELLANEOUS) ×2 IMPLANT
COVER SURGICAL LIGHT HANDLE (MISCELLANEOUS) ×2 IMPLANT
DRAPE C-ARM 42X72 X-RAY (DRAPES) ×2 IMPLANT
DRAPE STERI IOBAN 125X83 (DRAPES) ×2 IMPLANT
DRAPE U-SHAPE 47X51 STRL (DRAPES) ×6 IMPLANT
DRSG AQUACEL AG ADV 3.5X10 (GAUZE/BANDAGES/DRESSINGS) ×2 IMPLANT
DURAPREP 26ML APPLICATOR (WOUND CARE) ×2 IMPLANT
ELECT BLADE 4.0 EZ CLEAN MEGAD (MISCELLANEOUS)
ELECT CAUTERY BLADE 6.4 (BLADE) ×2 IMPLANT
ELECT REM PT RETURN 9FT ADLT (ELECTROSURGICAL) ×2
ELECTRODE BLDE 4.0 EZ CLN MEGD (MISCELLANEOUS) IMPLANT
ELECTRODE REM PT RTRN 9FT ADLT (ELECTROSURGICAL) ×1 IMPLANT
FACESHIELD WRAPAROUND (MASK) ×4 IMPLANT
FACESHIELD WRAPAROUND OR TEAM (MASK) ×2 IMPLANT
GLOVE BIO SURGEON STRL SZ8 (GLOVE) ×10 IMPLANT
GLOVE BIOGEL PI IND STRL 8 (GLOVE) ×1 IMPLANT
GLOVE BIOGEL PI INDICATOR 8 (GLOVE) ×1
GOWN STRL REUS W/ TWL LRG LVL3 (GOWN DISPOSABLE) ×2 IMPLANT
GOWN STRL REUS W/ TWL XL LVL3 (GOWN DISPOSABLE) ×1 IMPLANT
GOWN STRL REUS W/TWL LRG LVL3 (GOWN DISPOSABLE) ×4
GOWN STRL REUS W/TWL XL LVL3 (GOWN DISPOSABLE) ×2
KIT BASIN OR (CUSTOM PROCEDURE TRAY) ×2 IMPLANT
KIT ROOM TURNOVER OR (KITS) ×2 IMPLANT
LINER BOOT UNIVERSAL DISP (MISCELLANEOUS) ×2 IMPLANT
MANIFOLD NEPTUNE II (INSTRUMENTS) ×1 IMPLANT
NS IRRIG 1000ML POUR BTL (IV SOLUTION) ×2 IMPLANT
PACK TOTAL JOINT (CUSTOM PROCEDURE TRAY) ×2 IMPLANT
PAD ARMBOARD 7.5X6 YLW CONV (MISCELLANEOUS) ×4 IMPLANT
RETRACTOR WND ALEXIS 18 MED (MISCELLANEOUS) ×1 IMPLANT
RTRCTR WOUND ALEXIS 18CM MED (MISCELLANEOUS) ×2
STAPLER VISISTAT 35W (STAPLE) ×2 IMPLANT
SUT ETHIBOND NAB CT1 #1 30IN (SUTURE) ×4 IMPLANT
SUT VIC AB 0 CT1 27 (SUTURE)
SUT VIC AB 0 CT1 27XBRD ANBCTR (SUTURE) IMPLANT
SUT VIC AB 1 CT1 27 (SUTURE) ×2
SUT VIC AB 1 CT1 27XBRD ANBCTR (SUTURE) ×1 IMPLANT
SUT VIC AB 2-0 CT1 27 (SUTURE) ×2
SUT VIC AB 2-0 CT1 TAPERPNT 27 (SUTURE) ×1 IMPLANT
SUT VLOC 180 0 24IN GS25 (SUTURE) ×2 IMPLANT
TOWEL OR 17X24 6PK STRL BLUE (TOWEL DISPOSABLE) ×2 IMPLANT
TOWEL OR 17X26 10 PK STRL BLUE (TOWEL DISPOSABLE) ×4 IMPLANT
TRAY FOLEY CATH 14FR (SET/KITS/TRAYS/PACK) IMPLANT
WATER STERILE IRR 1000ML POUR (IV SOLUTION) ×2 IMPLANT

## 2014-03-12 NOTE — Anesthesia Postprocedure Evaluation (Signed)
  Anesthesia Post-op Note  Patient: Kevin Weiss  Procedure(s) Performed: Procedure(s): TOTAL HIP ARTHROPLASTY ANTERIOR APPROACH (Right)  Patient Location: PACU  Anesthesia Type:General  Level of Consciousness: awake, alert  and oriented  Airway and Oxygen Therapy: Patient Spontanous Breathing and Patient connected to nasal cannula oxygen  Post-op Pain: moderate  Post-op Assessment: Post-op Vital signs reviewed  Post-op Vital Signs: Reviewed  Last Vitals:  Filed Vitals:   03/12/14 1109  BP:   Pulse: 84  Temp:   Resp: 18    Complications: No apparent anesthesia complications

## 2014-03-12 NOTE — Progress Notes (Signed)
Pt usin home cpap. PLacing self on/off. Rt will continue to monitor.

## 2014-03-12 NOTE — Op Note (Signed)
PRE-OP DIAGNOSIS:  RIGHT HIP DEGENERATIVE JOINT DISEASE POST-OP DIAGNOSIS:  same PROCEDURE: RIGHT TOTAL HIP ARTHROPLASTY ANTERIOR APPROACH ANESTHESIA:  General SURGEON:  Melrose Nakayama MD ASSISTANT:  Loni Dolly PA-C   INDICATIONS FOR PROCEDURE:  The patient is a 73 y.o. male with a long history of a painful hip.  This has persisted despite multiple conservative measures.  The patient has persisted with pain and dysfunction making rest and activity difficult.  A total hip replacement is offered as surgical treatment.  Informed operative consent was obtained after discussion of possible complications including reaction to anesthesia, infection, neurovascular injury, dislocation, DVT, PE, and death.  The importance of the postoperative rehab program to optimize result was stressed with the patient.  SUMMARY OF FINDINGS AND PROCEDURE:  Under general anesthesia through a anterior approach an the Hana table a right THR was performed.  The patient had severe degenerative change and excellent bone quality.  We used DePuy components to replace the hip and these were size KA12 [place] femur capped with a 36 mm -2 metal hip ball.  On the acetabular side we used a size 52 Gription shell with a  plus 4 neutral polyethylene liner.  We did use a hole eliminator.  Loni Dolly PA-C assisted throughout and was invaluable to the completion of the case in that he helped position and retract while I performed the procedure.  He also closed simultaneously to help minimize OR time.  I used fluoroscopy throughout the case to check position of implants and leg lengths and read all of these views myself.  DESCRIPTION OF PROCEDURE:  The patient was taken to the OR suite where general anesthetic was applied.  The patient was then positioned on the Hana table supine.  All bony prominences were appropriately padded.  Prep and drape was then performed in normal sterile fashion.  The patient was given kefzol preoperative antibiotic and  an appropriate time out was performed.  We then took an anterior approach to the right hip.  Dissection was taken through adipose to the tensor fascia lata fascia.  This structure was incised longitudinally and we dissected in the intermuscular interval just medial to this muscle.  Cobra retractors were placed superior and inferior to the femoral neck superficial to the capsule.  A capsular incision was then made and the retractors were placed along the femoral neck.  Xray was brought in to get a good level for the femoral neck cut which was made with an oscillating saw and osteotome.  The femoral head was removed with a corkscrew.  The acetabulum was exposed and some labral tissues were excised. Reaming was taken to the inside wall of the pelvis and sequentially up to 1 mm smaller than the actual component.  A trial of components was done and then the aforementioned acetabular shell was placed in appropriate tilt and anteversion confirmed by fluoroscopy. The liner was placed along with the hole eliminator and attention was turned to the femur.  The leg was brought down and over into adduction and the elevator bar was used to raise the femur up gently in the wound.  The piriformis was released with care taken to preserve the obturator internus attachment and all of the posterior capsule. The femur was reamed and then broached to the appropriate size.  A trial reduction was done and the aforementioned head and neck assembly gave [place] the best stability in extension with external rotation.  Leg lengths were felt to be about equal by fluoroscopic exam.  The trial components were removed and the wound irrigated.  We then placed the femoral component in appropriate anteversion.  The head was applied to a dry stem neck and the hip again reduced.  It was again stable in the aforementioned position.  The would was irrigated again followed by re-approximation of anterior capsule with ethibond suture. Tensor fascia was repaired  with V-loc suture  followed by subcutaneous closure with #O and #2 undyed vicryl.  Skin was closed with staples followed by a sterile dressing.  EBL and IOF can be obtained from anesthesia records.  DISPOSITION:  The patient was extubated in the OR and taken to PACU in stable condition to be admitted to the Orthopedic Surgery for appropriate post-op care to include perioperative antibiotics and DVT prophylaxis.

## 2014-03-12 NOTE — Anesthesia Procedure Notes (Signed)
Procedure Name: Intubation Date/Time: 03/12/2014 7:39 AM Performed by: Rebekah Chesterfield L Pre-anesthesia Checklist: Patient identified, Emergency Drugs available, Suction available, Patient being monitored and Timeout performed Patient Re-evaluated:Patient Re-evaluated prior to inductionOxygen Delivery Method: Circle system utilized Preoxygenation: Pre-oxygenation with 100% oxygen Intubation Type: IV induction and Cricoid Pressure applied Ventilation: Oral airway inserted - appropriate to patient size and Mask ventilation without difficulty Laryngoscope Size: Mac and 4 Grade View: Grade III Tube type: Oral Tube size: 8.0 mm Number of attempts: 2 Airway Equipment and Method: Stylet and Video-laryngoscopy Placement Confirmation: ETT inserted through vocal cords under direct vision,  breath sounds checked- equal and bilateral and positive ETCO2 Secured at: 22 cm Tube secured with: Tape Dental Injury: Teeth and Oropharynx as per pre-operative assessment  Difficulty Due To: Difficulty was anticipated, Difficult Airway- due to limited oral opening, Difficult Airway- due to large tongue and Difficult Airway- due to reduced neck mobility Future Recommendations: Recommend- induction with short-acting agent, and alternative techniques readily available Comments: Pt with thick short neck ,limited mouth opening.   DL c mac #4 unable to visualize past epiglottis . Pt easily mask ventilated c oral airway in place glide scope utilized with limited view ett passed s trauma

## 2014-03-12 NOTE — Evaluation (Signed)
Physical Therapy Evaluation Patient Details Name: Kevin Weiss MRN: [id number] DOB: [birth year] Today's Date: 03/12/2014   History of Present Illness  pt presents with R THA.    Clinical Impression  Pt doing great and anticipate will continue to make great progress.  Will continue to follow.      Follow Up Recommendations Home health PT;Supervision - Intermittent    Equipment Recommendations  Rolling walker with 5" wheels;3in1 (PT)    Recommendations for Other Services       Precautions / Restrictions Precautions Precautions: Fall Restrictions Weight Bearing Restrictions: Yes RLE Weight Bearing: Weight bearing as tolerated      Mobility  Bed Mobility Overal bed mobility: Needs Assistance Bed Mobility: Supine to Sit     Supine to sit: Mod assist     General bed mobility comments: cues for sequencing and A with bringing trunk up to sitting and bringing R LE OOB.    Transfers Overall transfer level: Needs assistance Equipment used: Rolling walker (2 wheeled) Transfers: Sit to/from Stand Sit to Stand: Min assist         General transfer comment: cues for UE use and controlling descent to sitting.    Ambulation/Gait Ambulation/Gait assistance: Min guard Ambulation Distance (Feet): 150 Feet Assistive device: Rolling walker (2 wheeled) Gait Pattern/deviations: Step-to pattern;Decreased step length - left;Decreased stance time - right;Decreased stride length     General Gait Details: cues for gait sequencing, upright posture.    Stairs            Wheelchair Mobility    Modified Rankin (Stroke Patients Only)       Balance                                             Pertinent Vitals/Pain 7/10.  Premedicated.    Home Living Family/patient expects to be discharged to:: Private residence Living Arrangements: Spouse/significant other Available Help at Discharge: Family;Available PRN/intermittently Type of Home: House Home  Access: Stairs to enter Entrance Stairs-Rails: Right Entrance Stairs-Number of Steps: 5 Home Layout: One level Home Equipment: None      Prior Function Level of Independence: Independent               Hand Dominance        Extremity/Trunk Assessment   Upper Extremity Assessment: Defer to OT evaluation           Lower Extremity Assessment: RLE deficits/detail RLE Deficits / Details: Generally weak post-op.      Cervical / Trunk Assessment: Normal  Communication   Communication: No difficulties  Cognition Arousal/Alertness: Awake/alert Behavior During Therapy: WFL for tasks assessed/performed Overall Cognitive Status: Within Functional Limits for tasks assessed                      General Comments      Exercises Total Joint Exercises Ankle Circles/Pumps: AROM;Both;10 reps Quad Sets: AROM;Both;10 reps Long Arc Quad: AROM;Right;10 reps      Assessment/Plan    PT Assessment Patient needs continued PT services  PT Diagnosis Abnormality of gait;Acute pain   PT Problem List Decreased strength;Decreased activity tolerance;Decreased balance;Decreased mobility;Decreased knowledge of use of DME;Pain  PT Treatment Interventions DME instruction;Gait training;Stair training;Functional mobility training;Therapeutic activities;Therapeutic exercise;Balance training;Patient/family education   PT Goals (Current goals can be found in the Care Plan section) Acute Rehab PT Goals Patient Stated Goal:  Back doing anything.   PT Goal Formulation: With patient Time For Goal Achievement: 03/19/14 Potential to Achieve Goals: Good    Frequency 7X/week   Barriers to discharge        Co-evaluation               End of Session Equipment Utilized During Treatment: Gait belt Activity Tolerance: Patient tolerated treatment well Patient left: in chair;with call bell/phone within reach Nurse Communication: Mobility status         Time: 2202-5427 PT Time  Calculation (min): 31 min   Charges:   PT Evaluation $Initial PT Evaluation Tier I: 1 Procedure PT Treatments $Gait Training: 8-22 mins $Therapeutic Exercise: 8-22 mins   PT G CodesCatarina Hartshorn, [place] 03/12/2014, 3:10 PM

## 2014-03-12 NOTE — Progress Notes (Signed)
Utilization review completed.  

## 2014-03-12 NOTE — Anesthesia Preprocedure Evaluation (Addendum)
Anesthesia Evaluation  Patient identified by MRN, date of birth, ID band Patient awake    Reviewed: Allergy & Precautions, H&P , NPO status , Patient's Chart, lab work & pertinent test results  Airway Mallampati: I TM Distance: >3 FB Neck ROM: Full    Dental  (+) Teeth Intact, Dental Advisory Given   Pulmonary sleep apnea and Continuous Positive Airway Pressure Ventilation ,  breath sounds clear to auscultation        Cardiovascular hypertension, Pt. on medications Rhythm:Regular Rate:Normal     Neuro/Psych    GI/Hepatic GERD-  Medicated and Controlled,  Endo/Other  Morbid obesity  Renal/GU      Musculoskeletal   Abdominal   Peds  Hematology   Anesthesia Other Findings   Reproductive/Obstetrics                         Anesthesia Physical Anesthesia Plan  ASA: II  Anesthesia Plan: General   Post-op Pain Management:    Induction: Intravenous  Airway Management Planned: Oral ETT  Additional Equipment:   Intra-op Plan:   Post-operative Plan: Extubation in OR  Informed Consent: I have reviewed the patients History and Physical, chart, labs and discussed the procedure including the risks, benefits and alternatives for the proposed anesthesia with the patient or authorized representative who has indicated his/her understanding and acceptance.   Dental advisory given  Plan Discussed with: CRNA, Anesthesiologist and Surgeon  Anesthesia Plan Comments:         Anesthesia Quick Evaluation

## 2014-03-12 NOTE — Plan of Care (Signed)
Problem: Consults Goal: Diagnosis- Total Joint Replacement Primary Total Hip Right     

## 2014-03-12 NOTE — Interval H&P Note (Signed)
History and Physical Interval Note:  03/12/2014 7:22 AM  Kevin Weiss  has presented today for surgery, with the diagnosis of RIGHT HIP DEGENERATIVE JOINT DISEASE  The various methods of treatment have been discussed with the patient and family. After consideration of risks, benefits and other options for treatment, the patient has consented to  Procedure(s): TOTAL HIP ARTHROPLASTY ANTERIOR APPROACH (Right) as a surgical intervention .  The patient's history has been reviewed, patient examined, no change in status, stable for surgery.  I have reviewed the patient's chart and labs.  Questions were answered to the patient's satisfaction.     Kevin Weiss

## 2014-03-12 NOTE — Transfer of Care (Signed)
Immediate Anesthesia Transfer of Care Note  Patient: Kevin Weiss  Procedure(s) Performed: Procedure(s): TOTAL HIP ARTHROPLASTY ANTERIOR APPROACH (Right)  Patient Location: PACU  Anesthesia Type:General  Level of Consciousness: awake, alert , oriented and patient cooperative  Airway & Oxygen Therapy: Patient Spontanous Breathing and Patient connected to nasal cannula oxygen  Post-op Assessment: Report given to PACU RN, Post -op Vital signs reviewed and stable and Patient moving all extremities  Post vital signs: Reviewed and stable  Complications: No apparent anesthesia complications

## 2014-03-12 NOTE — Progress Notes (Signed)
Orthopedic Tech Progress Note Patient Details:  [name] [birth year] [id number] OHF applied to bed Patient ID: Kevin Weiss, male   DOB: 02-Aug-1941, 73 y.o.   MRN: [id number]   Fenton Foy 03/12/2014, 2:42 PM

## 2014-03-13 ENCOUNTER — Encounter (HOSPITAL_COMMUNITY): Payer: Self-pay | Admitting: Orthopaedic Surgery

## 2014-03-13 LAB — CBC
HCT: 37.6 % — ABNORMAL LOW (ref 39.0–52.0)
Hemoglobin: 12.6 g/dL — ABNORMAL LOW (ref 13.0–17.0)
MCH: 28.3 pg (ref 26.0–34.0)
MCHC: 33.5 g/dL (ref 30.0–36.0)
MCV: 84.3 fL (ref 78.0–100.0)
PLATELETS: 172 10*3/uL (ref 150–400)
RBC: 4.46 MIL/uL (ref 4.22–5.81)
RDW: 14.7 % (ref 11.5–15.5)
WBC: 9.1 10*3/uL (ref 4.0–10.5)

## 2014-03-13 LAB — BASIC METABOLIC PANEL
ANION GAP: 16 — AB (ref 5–15)
BUN: 11 mg/dL (ref 6–23)
CHLORIDE: 97 meq/L (ref 96–112)
CO2: 25 meq/L (ref 19–32)
CREATININE: 1.05 mg/dL (ref 0.50–1.35)
Calcium: 8.9 mg/dL (ref 8.4–10.5)
GFR calc Af Amer: 81 mL/min — ABNORMAL LOW (ref >90)
GFR calc non Af Amer: 70 mL/min — ABNORMAL LOW (ref >90)
Glucose, Bld: 138 mg/dL — ABNORMAL HIGH (ref 70–99)
POTASSIUM: 4.4 meq/L (ref 3.7–5.3)
Sodium: 138 mEq/L (ref 137–147)

## 2014-03-13 MED ORDER — OXYCODONE-ACETAMINOPHEN 5-325 MG PO TABS
1.0000 | ORAL_TABLET | ORAL | Status: DC | PRN
  Administered 2014-03-13 – 2014-03-14 (×6): 2 via ORAL
  Filled 2014-03-13 (×6): qty 2

## 2014-03-13 MED ORDER — PNEUMOCOCCAL VAC POLYVALENT 25 MCG/0.5ML IJ INJ
0.5000 mL | INJECTION | INTRAMUSCULAR | Status: AC
  Administered 2014-03-14: 0.5 mL via INTRAMUSCULAR
  Filled 2014-03-13: qty 0.5

## 2014-03-13 NOTE — Care Management Note (Addendum)
CARE MANAGEMENT NOTE 03/13/2014  Patient:  Kevin Weiss, Kevin Weiss   Account Number:  [id number]  Date Initiated:  03/13/2014  Documentation initiated by:  Ricki Miller  Subjective/Objective Assessment:   73 yr old male s/p right total hip arthroplasty.     Action/Plan:   Case manager spoke with patient concerning home health and DME needs. Choice offered. Patient preoperatively setup with [facility], no changes.   Anticipated DC Date:  03/14/2014   Anticipated DC Plan:  [place] Planning Services  CM consult      [facility] Choice  HOME HEALTH  DURABLE MEDICAL EQUIPMENT   Choice offered to / List presented to:  C-1 Patient   DME arranged  [place]  3-N-1      DME agency  [place] arranged  [place]   Status of service:  Completed, signed off Medicare Important Message given?  YES (If response is "NO", the following Medicare IM given date fields will be blank) Date Medicare IM given:  03/13/2014 Medicare IM given by:  Ricki Miller Date Additional Medicare IM given:   Additional Medicare IM given by:    Discharge Disposition:  [place]  Per UR Regulation:  Reviewed for med. necessity/level of care/duration of stay

## 2014-03-13 NOTE — Evaluation (Signed)
Agree with note. [place] 03/13/2014 Nestor Lewandowsky, OTR/L Pager: [phone]

## 2014-03-13 NOTE — Progress Notes (Signed)
Physical Therapy Treatment Patient Details Name: Kevin Weiss MRN: [id number] DOB: 08/14/1941 Today's Date: 03/13/2014    History of Present Illness pt presents with R THA.      PT Comments    Pt indicates pain better than this am and was able to increase amb distance this pm.  Pt ed on safety with car transfer.  Will continue to follow.    Follow Up Recommendations  Home health PT;Supervision - Intermittent     Equipment Recommendations  Rolling walker with 5" wheels;3in1 (PT)    Recommendations for Other Services       Precautions / Restrictions Precautions Precautions: Fall Restrictions Weight Bearing Restrictions: Yes RLE Weight Bearing: Weight bearing as tolerated    Mobility  Bed Mobility Overal bed mobility: Needs Assistance Bed Mobility: Supine to Sit     Supine to sit: Mod assist     General bed mobility comments: Practiced getting OOB to R side because pt stated that he had to sleep on that side of the bed due to the sleep apnea machine. Mod A required for both LEs and to assist trunk with sitting up.  Transfers Overall transfer level: Needs assistance Equipment used: Rolling walker (2 wheeled) Transfers: Sit to/from Stand Sit to Stand: Min guard         General transfer comment: pt demos good use of UEs, but difficulty controlling descent to sitting.    Ambulation/Gait Ambulation/Gait assistance: Min guard Ambulation Distance (Feet): 200 Feet Assistive device: Rolling walker (2 wheeled) Gait Pattern/deviations: Step-through pattern;Decreased step length - left;Decreased stance time - right;Decreased stride length     General Gait Details: pt able to increase amb distance this pm.     Stairs            Wheelchair Mobility    Modified Rankin (Stroke Patients Only)       Balance Overall balance assessment: Needs assistance Sitting-balance support: Feet supported Sitting balance-Leahy Scale: Fair     Standing balance  support: No upper extremity supported Standing balance-Leahy Scale: Fair Standing balance comment: able to stand at the sink for grooming without support                    Cognition Arousal/Alertness: Awake/alert Behavior During Therapy: WFL for tasks assessed/performed Overall Cognitive Status: Within Functional Limits for tasks assessed                      Exercises Total Joint Exercises Quad Sets: AROM;Both;10 reps Hip ABduction/ADduction: AAROM;Right;10 reps Long Arc Quad: AROM;Right;10 reps    General Comments        Pertinent Vitals/Pain 5/10.  Premedicated.      Home Living Family/patient expects to be discharged to:: Private residence Living Arrangements: Spouse/significant other Available Help at Discharge: Family;Available PRN/intermittently Type of Home: House Home Access: Stairs to enter Entrance Stairs-Rails: Right Home Layout: One level Home Equipment: None      Prior Function Level of Independence: Independent          PT Goals (current goals can now be found in the care plan section) Acute Rehab PT Goals Patient Stated Goal: walking good again PT Goal Formulation: With patient Time For Goal Achievement: 03/19/14 Potential to Achieve Goals: Good Progress towards PT goals: Progressing toward goals    Frequency  7X/week    PT Plan Current plan remains appropriate    Co-evaluation             End of Session  Equipment Utilized During Treatment: Gait belt Activity Tolerance: Patient tolerated treatment well Patient left: in chair;with call bell/phone within reach     Time: 1333-1350 PT Time Calculation (min): 17 min  Charges:  $Gait Training: 8-22 mins                    G CodesCatarina Hartshorn, [place] 03/13/2014, 2:34 PM

## 2014-03-13 NOTE — Evaluation (Signed)
Occupational Therapy Evaluation Patient Details Name: Kevin Weiss MRN: [id number] DOB: 17-Sep-1940 Today's Date: 03/13/2014    History of Present Illness pt presents with R THA.     Clinical Impression   Pta pt was independent with ADLs and now presents with generalized weakness, acute pain and limited hip ROM interfering with self care tasks. Educated pt on LB ADL compensatory strategies and AE to assist with those tasks, different DME options for showering and to prop the RLE out when standing/sitting. Pt would benefit from additional acute OT to practice LB ADLs with AE and a shower transfer to the appropriate DME prior to D/C home. Will continue to follow.    Follow Up Recommendations  Supervision/Assistance - 24 hour    Equipment Recommendations  3 in 1 bedside comode       Precautions / Restrictions Precautions Precautions: Fall Restrictions Weight Bearing Restrictions: Yes RLE Weight Bearing: Weight bearing as tolerated      Mobility Bed Mobility Overal bed mobility: Needs Assistance Bed Mobility: Supine to Sit     Supine to sit: Mod assist     General bed mobility comments: Practiced getting OOB to R side because pt stated that he had to sleep on that side of the bed due to the sleep apnea machine. Mod A required for both LEs and to assist trunk with sitting up.  Transfers Overall transfer level: Needs assistance Equipment used: Rolling walker (2 wheeled) Transfers: Sit to/from Stand Sit to Stand: Min guard         General transfer comment: Min A for initial sit>stand from bed due to pt pain but then pt was min guard from toilet.     Balance Overall balance assessment: Needs assistance Sitting-balance support: Feet supported Sitting balance-Leahy Scale: Fair     Standing balance support: No upper extremity supported Standing balance-Leahy Scale: Fair Standing balance comment: able to stand at the sink for grooming without support                             ADL Overall ADL's : Needs assistance/impaired Eating/Feeding: Independent;Sitting   Grooming: Supervision/safety;Standing   Upper Body Bathing: Set up;Sitting   Lower Body Bathing: Sit to/from stand;Moderate assistance   Upper Body Dressing : Set up;Sitting   Lower Body Dressing: Moderate assistance;Sit to/from stand   Toilet Transfer: Min guard;Ambulation;RW;BSC   Toileting- Water quality scientist and Hygiene: Supervision/safety;Sit to/from stand       Functional mobility during ADLs: Min guard;Rolling walker General ADL Comments: Educated pt on LB bathing/dressing sequence and technique and to prop the RLE out when sitting/standing. Pt took the joints in motion class and is interested in practicing LB ADLs using the AE. Educated pt on possible DME needs and the different shower DME options.               Pertinent Vitals/Pain C/o pain at the beginning of tx but did not rate. Pt stated that his pain decreased throughout tx and it felt good to stand.     Hand Dominance Right   Extremity/Trunk Assessment Upper Extremity Assessment Upper Extremity Assessment: Overall WFL for tasks assessed   Lower Extremity Assessment Lower Extremity Assessment: Defer to PT evaluation   Cervical / Trunk Assessment Cervical / Trunk Assessment: Normal   Communication Communication Communication: No difficulties   Cognition Arousal/Alertness: Awake/alert   Overall Cognitive Status: Within Functional Limits for tasks assessed  Home Living Family/patient expects to be discharged to:: Private residence Living Arrangements: Spouse/significant other Available Help at Discharge: Family;Available PRN/intermittently Type of Home: House Home Access: Stairs to enter [employer] of Steps: 5 Entrance Stairs-Rails: Right Home Layout: One level     Bathroom Shower/Tub: Walk-in shower;Door   Bathroom Toilet:  Handicapped height     Home Equipment: None          Prior Functioning/Environment Level of Independence: Independent             OT Diagnosis: Generalized weakness;Acute pain   OT Problem List: Decreased strength;Decreased range of motion;Decreased knowledge of use of DME or AE;Impaired balance (sitting and/or standing);Pain   OT Treatment/Interventions: Self-care/ADL training;Energy conservation;DME and/or AE instruction;Balance training;Patient/family education    OT Goals(Current goals can be found in the care plan section) Acute Rehab OT Goals Patient Stated Goal: walking good again OT Goal Formulation: With patient Time For Goal Achievement: 03/20/14 Potential to Achieve Goals: Good  OT Frequency: Min 2X/week              End of Session Equipment Utilized During Treatment: Gait belt;Rolling walker  Activity Tolerance: Patient tolerated treatment well Patient left:  (Up with PT)   Time:  -    Charges:    G-CodesLyda Perone April 08, 2014, 11:06 AM

## 2014-03-13 NOTE — Progress Notes (Signed)
Subjective: 1 Day Post-Op Procedure(s) (LRB): TOTAL HIP ARTHROPLASTY ANTERIOR APPROACH (Right)  Activity level:  wbat Diet tolerance:  Eating well Voiding:  ok Patient reports pain as moderate.    Objective: Vital signs in last 24 hours: Temp:  [98 F (36.7 C)-100.4 F (38 C)] 100 F (37.8 C) (07/29 0555) Pulse Rate:  [83-99] 97 (07/29 0555) Resp:  [15-22] 18 (07/29 0555) BP: (122-152)/(67-84) 144/84 mmHg (07/29 0555) SpO2:  [96 %-100 %] 97 % (07/29 0555)  Labs:  Recent Labs  03/13/14 0520  HGB 12.6*    Recent Labs  03/13/14 0520  WBC 9.1  RBC 4.46  HCT 37.6*  PLT 172    Recent Labs  03/13/14 0520  NA 138  K 4.4  CL 97  CO2 25  BUN 11  CREATININE 1.05  GLUCOSE 138*  CALCIUM 8.9   No results found for this basename: LABPT, INR,  in the last 72 hours  Physical Exam:  Neurologically intact ABD soft Neurovascular intact Sensation intact distally Intact pulses distally Dorsiflexion/Plantar flexion intact Incision: scant drainage No cellulitis present Compartment soft  Assessment/Plan:  1 Day Post-Op Procedure(s) (LRB): TOTAL HIP ARTHROPLASTY ANTERIOR APPROACH (Right) Advance diet Up with therapy D/C IV fluids Plan for discharge tomorrow Discharge home with home health if stable and cleared by PT Switch Vicodin 5mg  to percocet 5mg  for better pain control. Continue on ASA 325mg  BID x 4 weeks. Follow up in office 2 weeks post op.     Kevin Weiss, Larwance Sachs 03/13/2014, 7:45 AM

## 2014-03-13 NOTE — Progress Notes (Signed)
OT Cancellation Note  Patient Details Name: Kevin Weiss MRN: [id number] DOB: 1941-07-02   Cancelled Treatment:    Reason Eval/Treat Not Completed: Pain limiting ability to participate (RN administered meds.  Will reattempt.)  Malka So 03/13/2014, 9:55 AM [phone]

## 2014-03-13 NOTE — Progress Notes (Signed)
Physical Therapy Treatment Patient Details Name: Kevin Weiss MRN: [id number] DOB: 01-30-1941 Today's Date: 03/13/2014    History of Present Illness pt presents with R THA.      PT Comments    Pt with increased pain today limiting mobility, however pt very motivated to amb.  Feel with pain control pt will make great progress.  Will continue to follow.    Follow Up Recommendations  Home health PT;Supervision - Intermittent     Equipment Recommendations  Rolling walker with 5" wheels;3in1 (PT)    Recommendations for Other Services       Precautions / Restrictions Precautions Precautions: Fall Restrictions Weight Bearing Restrictions: Yes RLE Weight Bearing: Weight bearing as tolerated    Mobility  Bed Mobility Overal bed mobility: Needs Assistance Bed Mobility: Supine to Sit     Supine to sit: Mod assist     General bed mobility comments: Practiced getting OOB to R side because pt stated that he had to sleep on that side of the bed due to the sleep apnea machine. Mod A required for both LEs and to assist trunk with sitting up.  Transfers Overall transfer level: Needs assistance Equipment used: Rolling walker (2 wheeled) Transfers: Sit to/from Stand Sit to Stand: Min guard         General transfer comment: pt standing in room with OT, but required guarding for returning to sitting.    Ambulation/Gait Ambulation/Gait assistance: Min guard Ambulation Distance (Feet): 110 Feet Assistive device: Rolling walker (2 wheeled) Gait Pattern/deviations: Step-through pattern;Decreased step length - left;Decreased stance time - right;Decreased stride length     General Gait Details: pt indicates increased pain this am limiting amb distance.     Stairs            Wheelchair Mobility    Modified Rankin (Stroke Patients Only)       Balance Overall balance assessment: Needs assistance Sitting-balance support: Feet supported Sitting balance-Leahy Scale:  Fair     Standing balance support: No upper extremity supported Standing balance-Leahy Scale: Fair Standing balance comment: able to stand at the sink for grooming without support                    Cognition Arousal/Alertness: Awake/alert Behavior During Therapy: WFL for tasks assessed/performed Overall Cognitive Status: Within Functional Limits for tasks assessed                      Exercises Total Joint Exercises Quad Sets: AROM;Both;10 reps Hip ABduction/ADduction: AAROM;Right;10 reps Long Arc Quad: AROM;Right;10 reps    General Comments        Pertinent Vitals/Pain 7/10.  Premedicated.      Home Living Family/patient expects to be discharged to:: Private residence Living Arrangements: Spouse/significant other Available Help at Discharge: Family;Available PRN/intermittently Type of Home: House Home Access: Stairs to enter Entrance Stairs-Rails: Right Home Layout: One level Home Equipment: None      Prior Function Level of Independence: Independent          PT Goals (current goals can now be found in the care plan section) Acute Rehab PT Goals Patient Stated Goal: walking good again PT Goal Formulation: With patient Time For Goal Achievement: 03/19/14 Potential to Achieve Goals: Good Progress towards PT goals: Progressing toward goals    Frequency  7X/week    PT Plan Current plan remains appropriate    Co-evaluation             End of  Session Equipment Utilized During Treatment: Gait belt Activity Tolerance: Patient tolerated treatment well Patient left: in chair;with call bell/phone within reach     Time: 1041-1103 PT Time Calculation (min): 22 min  Charges:  $Gait Training: 8-22 mins                    G CodesCatarina Hartshorn, [place] 03/13/2014, 11:51 AM

## 2014-03-14 DIAGNOSIS — M1610 Unilateral primary osteoarthritis, unspecified hip: Secondary | ICD-10-CM | POA: Diagnosis not present

## 2014-03-14 DIAGNOSIS — M169 Osteoarthritis of hip, unspecified: Secondary | ICD-10-CM | POA: Diagnosis not present

## 2014-03-14 LAB — CBC
HEMATOCRIT: 33.5 % — AB (ref 39.0–52.0)
HEMOGLOBIN: 11.2 g/dL — AB (ref 13.0–17.0)
MCH: 28 pg (ref 26.0–34.0)
MCHC: 33.4 g/dL (ref 30.0–36.0)
MCV: 83.8 fL (ref 78.0–100.0)
Platelets: 160 10*3/uL (ref 150–400)
RBC: 4 MIL/uL — ABNORMAL LOW (ref 4.22–5.81)
RDW: 14.7 % (ref 11.5–15.5)
WBC: 9.4 10*3/uL (ref 4.0–10.5)

## 2014-03-14 MED ORDER — METHOCARBAMOL 500 MG PO TABS: 500.0000 mg | ORAL_TABLET | Freq: Four times a day (QID) | ORAL | Status: DC | PRN

## 2014-03-14 MED ORDER — OXYCODONE-ACETAMINOPHEN 5-325 MG PO TABS: 1.0000 | ORAL_TABLET | ORAL | Status: DC | PRN

## 2014-03-14 MED ORDER — ASPIRIN 325 MG PO TBEC: 325.0000 mg | DELAYED_RELEASE_TABLET | Freq: Two times a day (BID) | ORAL | Status: DC

## 2014-03-14 NOTE — Discharge Summary (Signed)
Patient ID: Kevin Weiss MRN: [id number] DOB/AGE: 10/18/1940 73 y.o.  Admit date: 03/12/2014 Discharge date: 03/14/2014  Admission Diagnoses:  Principal Problem:   Degenerative joint disease (DJD) of hip Active Problems:   OBESITY, NOS   Discharge Diagnoses:  Same  Past Medical History  Diagnosis Date  . Hypertension     takes Amlodipine and Prinizide daily  . Heart murmur at birth   . Arthritis   . Joint pain   . History of colon polyps   . Sleep apnea     Surgeries: Procedure(s): TOTAL HIP ARTHROPLASTY ANTERIOR APPROACH on 03/12/2014   Consultants:    Discharged Condition: Improved  Hospital Course: Kevin Weiss is an 73 y.o. male who was admitted 03/12/2014 for operative treatment ofDegenerative joint disease (DJD) of hip. Patient has severe unremitting pain that affects sleep, daily activities, and work/hobbies. After pre-op clearance the patient was taken to the operating room on 03/12/2014 and underwent  Procedure(s): TOTAL HIP ARTHROPLASTY ANTERIOR APPROACH.    Patient was given perioperative antibiotics: Anti-infectives   Start     Dose/Rate Route Frequency Ordered Stop   03/12/14 1330  ceFAZolin (ANCEF) IVPB 2 g/50 mL premix     2 g 100 mL/hr over 30 Minutes Intravenous Every 6 hours 03/12/14 1147 03/12/14 2020   03/12/14 0600  ceFAZolin (ANCEF) IVPB 2 g/50 mL premix     2 g 100 mL/hr over 30 Minutes Intravenous On call to O.R. 03/11/14 1433 03/12/14 0803       Patient was given sequential compression devices, early ambulation, and chemoprophylaxis to prevent DVT.  Patient benefited maximally from hospital stay and there were no complications.    Recent vital signs: Patient Vitals for the past 24 hrs:  BP Temp Temp src Pulse Resp SpO2  03/14/14 0641 138/63 mmHg 99.1 F (37.3 C) Oral 82 16 99 %  03/13/14 2358 - - - - 18 -  03/13/14 2200 150/72 mmHg 99.5 F (37.5 C) Oral 97 16 99 %  03/13/14 2000 - - - - 16 -  03/13/14 1600 - - - - 18 -   03/13/14 1300 128/76 mmHg 98.1 F (36.7 C) - 97 18 98 %  03/13/14 1200 - - - - 18 -  03/13/14 0800 - - - - 18 -     Recent laboratory studies:  Recent Labs  03/13/14 0520 03/14/14 0620  WBC 9.1 9.4  HGB 12.6* 11.2*  HCT 37.6* 33.5*  PLT 172 160  NA 138  --   K 4.4  --   CL 97  --   CO2 25  --   BUN 11  --   CREATININE 1.05  --   GLUCOSE 138*  --   CALCIUM 8.9  --      Discharge Medications:     Medication List         amLODipine 5 MG tablet  Commonly known as:  NORVASC  Take 5 mg by mouth daily.     aspirin 325 MG EC tablet  Take 1 tablet (325 mg total) by mouth 2 (two) times daily after a meal.     clomiPHENE 50 MG tablet  Commonly known as:  CLOMID  Take 25 mg by mouth daily.     lisinopril-hydrochlorothiazide 10-12.5 MG per tablet  Commonly known as:  PRINZIDE,ZESTORETIC  Take 1 tablet by mouth daily.     methocarbamol 500 MG tablet  Commonly known as:  ROBAXIN  Take 1 tablet (500 mg total) by  mouth every 6 (six) hours as needed for muscle spasms.     oxyCODONE-acetaminophen 5-325 MG per tablet  Commonly known as:  PERCOCET/ROXICET  Take 1-2 tablets by mouth every 4 (four) hours as needed for severe pain.     Vitamin D-3 1000 UNITS Caps  Take 1 capsule by mouth daily.        Diagnostic Studies: Dg Chest 2 View  03/01/2014   CLINICAL DATA:  Preoperative evaluation for hip replacement  EXAM: CHEST  2 VIEW  COMPARISON:  None.  FINDINGS: The heart size and mediastinal contours are within normal limits. Both lungs are clear. The visualized skeletal structures are unremarkable.  IMPRESSION: No active cardiopulmonary disease.   Electronically Signed   By: Inez Catalina M.D.   On: 03/01/2014 12:37   Dg Hip Operative Right  03/12/2014   CLINICAL DATA:  Follow right anterior hip arthroplasty  EXAM: DG OPERATIVE RIGHT HIP  TECHNIQUE: Spot fluoroscopic AP images of the pubic symphysis and right hip are submitted.  COMPARISON:  None.  FINDINGS: Intraoperative  film of a right hip arthroplasty. No appreciable periprosthetic lucency. Hardware projects over the expected position. No rami fracture.  IMPRESSION: Intraoperative spot fluoroscopic images demonstrating right hip arthroplasty as above.   Electronically Signed   By: Carlos Levering M.D.   On: 03/12/2014 09:57    Disposition: Final discharge disposition not confirmed      Discharge Instructions   Call MD / Call 911    Complete by:  As directed   If you experience chest pain or shortness of breath, CALL 911 and be transported to the hospital emergency room.  If you develope a fever above 101 F, pus (white drainage) or increased drainage or redness at the wound, or calf pain, call your surgeon's office.     Constipation Prevention    Complete by:  As directed   Drink plenty of fluids.  Prune juice may be helpful.  You may use a stool softener, such as Colace (over the counter) 100 mg twice a day.  Use MiraLax (over the counter) for constipation as needed.     Diet - low sodium heart healthy    Complete by:  As directed      Increase activity slowly as tolerated    Complete by:  As directed            Follow-up Information   Follow up with [facility]. Call in 2 weeks. (Someone from [facility] will contact you concerning start date and time for physical  therapy.)    Specialty:  [place] information:   [place] [address] [phone]        Signed: Rich Fuchs 03/14/2014, 7:44 AM

## 2014-03-14 NOTE — Progress Notes (Signed)
Physical Therapy Treatment Patient Details Name: DEVESH MONFORTE MRN: [id number] DOB: 1940/11/17 Today's Date: 03/14/2014    History of Present Illness pt presents with R THA.      PT Comments    Pt moving great this am with continued pain control.  Pt ready for D/C from PT stand point.    Follow Up Recommendations  Home health PT;Supervision - Intermittent     Equipment Recommendations  Rolling walker with 5" wheels;3in1 (PT)    Recommendations for Other Services       Precautions / Restrictions Precautions Precautions: Fall Restrictions Weight Bearing Restrictions: Yes RLE Weight Bearing: Weight bearing as tolerated    Mobility  Bed Mobility                  Transfers Overall transfer level: Needs assistance Equipment used: Rolling walker (2 wheeled) Transfers: Sit to/from Stand Sit to Stand: Supervision         General transfer comment: pt demos good use of UEs, but difficulty controlling descent to sitting.    Ambulation/Gait Ambulation/Gait assistance: Supervision Ambulation Distance (Feet): 250 Feet Assistive device: Rolling walker (2 wheeled) Gait Pattern/deviations: Step-through pattern;Decreased step length - left;Decreased stance time - right;Decreased stride length     General Gait Details: pt with increased gait speed today and demos good use of RW.     Stairs Stairs: Yes Stairs assistance: Supervision Stair Management: One rail Right;Step to pattern;Forwards Number of Stairs: 2 General stair comments: pt able to verbalize stair sequencing to PT prior to attempting stairs. Demos good safety.    Wheelchair Mobility    Modified Rankin (Stroke Patients Only)       Balance                                    Cognition Arousal/Alertness: Awake/alert Behavior During Therapy: WFL for tasks assessed/performed Overall Cognitive Status: Within Functional Limits for tasks assessed                       Exercises      General Comments        Pertinent Vitals/Pain 4-5/10.  Premedicated.      Home Living                      Prior Function            PT Goals (current goals can now be found in the care plan section) Acute Rehab PT Goals Patient Stated Goal: walking good again PT Goal Formulation: With patient Time For Goal Achievement: 03/19/14 Potential to Achieve Goals: Good Progress towards PT goals: Progressing toward goals    Frequency  7X/week    PT Plan Current plan remains appropriate    Co-evaluation             End of Session Equipment Utilized During Treatment: Gait belt Activity Tolerance: Patient tolerated treatment well Patient left: in chair;with call bell/phone within reach     Time: 1638-4665 PT Time Calculation (min): 11 min  Charges:  $Gait Training: 8-22 mins                    G CodesCatarina Hartshorn, [place] 03/14/2014, 9:45 AM

## 2014-03-14 NOTE — Progress Notes (Signed)
D/C instructions and scripts given. Pt verbalized understanding of home instructions and IV D/Cd. Pts wife is at the bedside to transport Pt home.

## 2014-03-14 NOTE — Progress Notes (Signed)
Occupational Therapy Treatment Patient Details Name: Kevin Weiss MRN: [id number] DOB: [birth year] Today's Date: 03/14/2014    History of present illness pt presents with R THA.     OT comments  Thankful for AE option  Follow Up Recommendations  Supervision/Assistance - 24 hour    Equipment Recommendations  3 in 1 bedside comode       Precautions / Restrictions Precautions Precautions: Fall Restrictions Weight Bearing Restrictions: Yes RLE Weight Bearing: Weight bearing as tolerated       Mobility Bed Mobility                  Transfers Overall transfer level: Needs assistance Equipment used: Rolling walker (2 wheeled) Transfers: Sit to/from Stand Sit to Stand: Supervision         General transfer comment: pt demos good use of UEs, but difficulty controlling descent to sitting.          ADL       Grooming: Supervision/safety;Standing           Upper Body Dressing : Set up;Sitting;Standing   Lower Body Dressing: Minimal assistance;Sit to/from stand;With adaptive equipment   Toilet Transfer: Copy Details (indicate cue type and reason): sit to stand           General ADL Comments: educated on AE. he will obtain as wife use to have to don pts LB clothes.  Pt very thankfukl for AE education                Cognition   Behavior During Therapy: WFL for tasks assessed/performed Overall Cognitive Status: Within Functional Limits for tasks assessed                               General Comments  very motivated and pleasant!    Pertinent Vitals/ Pain       Frequency Min 2X/week     Progress Toward Goals  OT Goals(current goals can now be found in the care plan section)  Progress towards OT goals: Progressing toward goals  Acute Rehab OT Goals Patient Stated Goal: walking good again  Plan         End of Session Equipment Utilized During Treatment: Gait belt;Rolling walker    Activity Tolerance Patient tolerated treatment well   Patient Left in chair;with call bell/phone within reach (Up with PT)   Nurse Communication          Time: 9826-4158 OT Time Calculation (min): 24 min  Charges: OT General Charges $OT Visit: 1 Procedure OT Treatments $Self Care/Home Management : 23-37 mins  Raziya Aveni D 03/14/2014, 11:09 AM

## 2015-06-18 DIAGNOSIS — I10 Essential (primary) hypertension: Secondary | ICD-10-CM | POA: Diagnosis not present

## 2015-06-18 DIAGNOSIS — K5900 Constipation, unspecified: Secondary | ICD-10-CM | POA: Diagnosis not present

## 2015-09-01 DIAGNOSIS — G4733 Obstructive sleep apnea (adult) (pediatric): Secondary | ICD-10-CM | POA: Diagnosis not present

## 2015-09-01 DIAGNOSIS — I10 Essential (primary) hypertension: Secondary | ICD-10-CM | POA: Diagnosis not present

## 2015-09-22 DIAGNOSIS — R3915 Urgency of urination: Secondary | ICD-10-CM | POA: Diagnosis not present

## 2015-09-22 DIAGNOSIS — Z0000 Encounter for general adult medical examination without abnormal findings: Secondary | ICD-10-CM | POA: Diagnosis not present

## 2015-09-22 DIAGNOSIS — N401 Enlarged prostate with lower urinary tract symptoms: Secondary | ICD-10-CM | POA: Diagnosis not present

## 2015-10-09 DIAGNOSIS — H52223 Regular astigmatism, bilateral: Secondary | ICD-10-CM | POA: Diagnosis not present

## 2015-11-03 ENCOUNTER — Encounter: Payer: Self-pay | Admitting: Neurology

## 2015-11-03 ENCOUNTER — Ambulatory Visit (INDEPENDENT_AMBULATORY_CARE_PROVIDER_SITE_OTHER): Payer: Medicare HMO | Admitting: Neurology

## 2015-11-03 VITALS — BP 120/80 | HR 72 | Resp 20 | Ht 67.0 in | Wt 245.0 lb

## 2015-11-03 DIAGNOSIS — G4730 Sleep apnea, unspecified: Secondary | ICD-10-CM

## 2015-11-03 DIAGNOSIS — M2619 Other specified anomalies of jaw-cranial base relationship: Secondary | ICD-10-CM

## 2015-11-03 DIAGNOSIS — I517 Cardiomegaly: Secondary | ICD-10-CM

## 2015-11-03 DIAGNOSIS — G4733 Obstructive sleep apnea (adult) (pediatric): Secondary | ICD-10-CM

## 2015-11-03 DIAGNOSIS — Z9989 Dependence on other enabling machines and devices: Secondary | ICD-10-CM

## 2015-11-03 HISTORY — DX: Morbid (severe) obesity due to excess calories: E66.01

## 2015-11-03 HISTORY — DX: Obstructive sleep apnea (adult) (pediatric): Z99.89

## 2015-11-03 HISTORY — DX: Other specified anomalies of jaw-cranial base relationship: M26.19

## 2015-11-03 HISTORY — DX: Sleep apnea, unspecified: G47.30

## 2015-11-03 HISTORY — DX: Obstructive sleep apnea (adult) (pediatric): G47.33

## 2015-11-03 HISTORY — DX: Cardiomegaly: I51.7

## 2015-11-03 NOTE — Progress Notes (Signed)
SLEEP MEDICINE CLINIC   Provider:  Larey Weiss, M D  Referring Provider: Glendale Chard, MD Primary Care Physician:  Kevin Greenland, MD  Chief Complaint  Patient presents with  . New Patient (Initial Visit)    needs new cpap, uses [facility], cpap is too old to be downloaded, rm 11    HPI:  Kevin Weiss is a 75 y.o. male , seen here as a referral from Kevin. Baird Weiss / Kevin Brine, NP for a sleep consultation.    Chief complaint according to patient :Kevin Weiss has used a CPAP machine for the last 10 years that is no longer supported and cannot get parts for it.  He was seen for a colonoscopy at Kevin Weiss office and she found him apneic while the anesthesia wore off He underwent a sleep study well over a decade ago, 2004,  at [facility] with [facility]. The studies are not available for me here today to review. He also reported that he was initially placed on a interface that started to irritate the nasal labial fold and the skin of the upper lip. He would also like a new interface per se. He does have a larger neck circumference and retrognathia. His comorbidities are hypertension controlled on Norvasc, amlodipine and lisinopril-hydrochlorothiazide.  He also takes vitamin D3 and aspirin last sleep study according to his primary care records was in October 2004 he was using [facility] for supplies I was able to see his last laboratory results his hemoglobin A1c was 5.9, total cholesterol 175, BUN 14 calcium 9.8 creatinine 1.1 potassium 4.1 sodium 135 with a CBC and differential were normal.   Sleep habits are as follows:  The patient retired 5 years ago, he now is still up at 1 AM and sleeps 7-8 hours per night. He can sleep in as desired. He watches Tv and his computer before going to bed. He has neither screen in his bedroom.  His bedroom is quiet and cool, he sleeps usually through the night, has rare bathroom breaks. His wife goes several times and this sometimes  wakes him. He sleeps supine because of the CPAP interface. He used to be a side sleeper. He is keen on changing to a nasal pillow. He is not longer a mouth breather. He rises spontaneously at about 8.30. He does not nap in daytime. When he rises he feels restored and refreshed. He has neither dry mouth nor headaches in the morning. In the morning he will have a cup of coffee with breakfast and plans the day as he likes. His spouse is now retired as well. He drives long distances without feeling tired. Six-hour drives aren't  problem for him.   " CPAP changed my life"  Sleep medical history and family sleep history:  Nobody diagnosed with apnea. He was  sleepy after age 46, following weight gain. .   Social history:  Married, retired from [employer], Estate agent. Former shift work.   Review of Systems: Out of a complete 14 system review, the patient complains of only the following symptoms, and all other reviewed systems are negative. Snoring, shift Insurance underwriter.   Epworth score 2 , Fatigue severity score 32  , depression score 4   Social History   Social History  . Marital Status: Married    Spouse Name: N/A  . Number of Children: N/A  . Years of Education: N/A   Occupational History  . Not on file.   Social History Main Topics  .  Smoking status: Never Smoker   . Smokeless tobacco: Never Used  . Alcohol Use: Yes     Comment: rarely  . Drug Use: Yes    Special: Marijuana     Comment: 3 days ago  . Sexual Activity: Yes   Other Topics Concern  . Not on file   Social History Narrative    History reviewed. No pertinent family history.  Past Medical History  Diagnosis Date  . Hypertension     takes Amlodipine and Prinizide daily  . Heart murmur at birth   . Arthritis   . Joint pain   . History of colon polyps   . Sleep apnea     Past Surgical History  Procedure Laterality Date  . Knee arthroscopy Left 15+yrs ago  . Colonoscopy    . Total hip arthroplasty  Right 03/12/2014    Procedure: TOTAL HIP ARTHROPLASTY ANTERIOR APPROACH;  Surgeon: Hessie Dibble, MD;  Location: [facility];  Service: Orthopedics;  Laterality: Right;    Current Outpatient Prescriptions  Medication Sig Dispense Refill  . amLODipine (NORVASC) 5 MG tablet Take 5 mg by mouth daily.    Marland Kitchen aspirin EC 325 MG EC tablet Take 1 tablet (325 mg total) by mouth 2 (two) times daily after a meal. 60 tablet 0  . Cholecalciferol (VITAMIN D-3) 1000 UNITS CAPS Take 1 capsule by mouth daily.    Marland Kitchen lisinopril-hydrochlorothiazide (PRINZIDE,ZESTORETIC) 10-12.5 MG per tablet Take 1 tablet by mouth daily.     No current facility-administered medications for this visit.    Allergies as of 11/03/2015  . (No Known Allergies)    Vitals: BP 120/80 mmHg  Pulse 72  Resp 20  Ht 5\' 7"  (1.702 m)  Wt 245 lb (111.131 kg)  BMI 38.36 kg/m2 Last Weight:  Wt Readings from Last 1 Encounters:  11/03/15 245 lb (111.131 kg)   [id number] mass index is 38.36 kg/(m^2).     Last Height:   Ht Readings from Last 1 Encounters:  11/03/15 5\' 7"  (1.702 m)    Physical exam:  General: The patient is awake, alert and appears not in acute distress. The patient is well groomed. Head: Normocephalic, atraumatic. Neck is supple. Mallampati  4 /  Bruxism - uses mouth guard 18.75 inches neck circumference:. Nasal airflow open, Retrognathia is seen. Chin beard.  Cardiovascular:  Regular rate and rhythm, without  murmurs or carotid bruit, and without distended neck veins. Respiratory: Lungs are clear to auscultation. Skin:  Without evidence of edema, or rash Trunk: BMI is elevated  The patient's posture is erect   Neurologic exam : The patient is awake and alert, oriented to place and time.   Memory subjective  described as intact. Attention span & concentration ability appears normal.  Speech is fluent,  without dysarthria, dysphonia or aphasia.  Mood and affect are appropriate.  Cranial nerves: Pupils are equal and  briskly reactive to light. Funduscopic exam without evidence of pallor or edema.  Extraocular movements  in vertical and horizontal planes intact and without nystagmus. Visual fields by finger perimetry are intact. Hearing to finger rub intact.   Facial sensation intact to fine touch.  Facial motor strength is symmetric and tongue and uvula move midline. Shoulder shrug was symmetrical.   Motor exam:Normal tone, muscle bulk and symmetric strength in all extremities.  Sensory:  Fine touch, pinprick and vibration were tested in all extremities. Proprioception tested in the upper extremities was normal.  Coordination: Rapid alternating movements in the fingers/hands was  normal. Finger-to-nose maneuver  normal without evidence of ataxia, dysmetria or tremor.  Gait and station: Patient walks without assistive device and is able unassisted to climb up to the exam table. Strength within normal limits.  Stance is stable and normal.   Deep tendon reflexes: in the  upper and lower extremities are symmetric and intact. Babinski maneuver response is downgoing.  The patient was advised of the nature of the diagnosed sleep disorder , the treatment options and risks for general a health and wellness arising from not treating the condition.  I spent more than 40 minutes of face to face time with the patient. Greater than 50% of time was spent in counseling and coordination of care. We have discussed the diagnosis and differential and I answered the patient's questions.     Assessment:  After physical and neurologic examination, review of laboratory studies,  Personal review of imaging studies, reports of other /same  Imaging studies ,  Results of polysomnography/ neurophysiology testing and pre-existing records as far as provided in visit., my assessment is   1) Mr. Cortes has used CPAP for about 13 years and needs a new machine. His last baseline was adjusted even earlier. I will therefore invite him for a  split-night re-titration, with the goal of seeing only 1 hour of baseline sleep and then re-titrating him to CPAP we will also use the opportunity to fit him for a new mask and I'm thinking of an Eason nasal mask or dream wear nasal pillow. The exact A H I, or apnea hypopnea index, is not known to me and needs to be established. I would like to fit the patient was an interface that allows him to sleep on his side without dislodging the mask. In addition we can use oxygen should hypoxemia be found but is not responding to CPAP or is present independent of the presence of apnea.   2) Mr. Melde nose that his risk factors are his weight, his body mass index, but he also has a high-grade Mallampati and a large neck circumference which are partially related to his inherited building structure. He has retrognathia which also is a risk factor for obstructive sleep apnea. Should we not find apnea to be present or as an AHI of less than 10 I would certainly consider treating this patient with a dental advancement device. I will also attached to today's visit information some dietary information about obesity and about an exercise program.  3) Mr. Overgaard does not have nocturia but he has been on 3 blood pressure medications, one a combination pill of 2, to control his blood pressure. I hope that an adjustment in CPAP may also help him to control blood pressure peaks better.    Plan:  Treatment plan and additional workup :  Noticed to sleep lab technologist; please observe this patient for only 1 hour before re-titration of CPAP will be initiated. Uses time to fit the patient with different interfaces to his comfort. CO2 measures are not needed.  Asencion Partridge Shynia Daleo MD  11/03/2015   CC: Kevin Weiss, [place] [address] [place], [place] [address]

## 2015-11-03 NOTE — Patient Instructions (Signed)

## 2015-11-11 ENCOUNTER — Encounter: Payer: Self-pay | Admitting: *Deleted

## 2016-01-14 ENCOUNTER — Telehealth: Payer: Self-pay | Admitting: Neurology

## 2016-01-14 NOTE — Telephone Encounter (Signed)
Message For: [facility]                  Taken 31-MAY-17 at  4:00PM by TNA ------------------------------------------------------------  Kevin Weiss                CID  [id number]   Patient  SAME                  Pt's Dr  [facility]       Area Code  336  Phone#  [phone].Marland KitchenHAS ANY PROGRESS BEEN        MADE                                                  Disp:Y/N  N  If Y = C/B If No Response In 65minutes  ===========================================

## 2016-01-14 NOTE — Telephone Encounter (Signed)
Pt needs to be scheduled for his cpap titration study before we can order him a new cpap. Dawn, can you please investigate this?

## 2016-01-21 DIAGNOSIS — Z0000 Encounter for general adult medical examination without abnormal findings: Secondary | ICD-10-CM | POA: Diagnosis not present

## 2016-01-21 DIAGNOSIS — E669 Obesity, unspecified: Secondary | ICD-10-CM | POA: Diagnosis not present

## 2016-01-21 DIAGNOSIS — G4733 Obstructive sleep apnea (adult) (pediatric): Secondary | ICD-10-CM | POA: Diagnosis not present

## 2016-01-21 DIAGNOSIS — I10 Essential (primary) hypertension: Secondary | ICD-10-CM | POA: Diagnosis not present

## 2016-01-21 DIAGNOSIS — N529 Male erectile dysfunction, unspecified: Secondary | ICD-10-CM | POA: Diagnosis not present

## 2016-01-21 DIAGNOSIS — R7309 Other abnormal glucose: Secondary | ICD-10-CM | POA: Diagnosis not present

## 2016-02-18 ENCOUNTER — Ambulatory Visit (INDEPENDENT_AMBULATORY_CARE_PROVIDER_SITE_OTHER): Payer: Medicare HMO | Admitting: Neurology

## 2016-02-18 DIAGNOSIS — G4733 Obstructive sleep apnea (adult) (pediatric): Secondary | ICD-10-CM | POA: Diagnosis not present

## 2016-02-18 DIAGNOSIS — M2619 Other specified anomalies of jaw-cranial base relationship: Secondary | ICD-10-CM

## 2016-02-18 DIAGNOSIS — Z9989 Dependence on other enabling machines and devices: Secondary | ICD-10-CM

## 2016-02-18 DIAGNOSIS — I517 Cardiomegaly: Secondary | ICD-10-CM

## 2016-03-08 ENCOUNTER — Telehealth: Payer: Self-pay

## 2016-03-08 NOTE — Telephone Encounter (Signed)
I called pt to discuss sleep study results. No answer, left a message asking him to call me back.    

## 2016-03-09 ENCOUNTER — Telehealth: Payer: Self-pay

## 2016-03-09 DIAGNOSIS — G4733 Obstructive sleep apnea (adult) (pediatric): Secondary | ICD-10-CM

## 2016-03-09 NOTE — Telephone Encounter (Signed)
I spoke to pt regarding his sleep study results. I advised him that his study revealed moderate-severe osa to be present and Dr. Brett Fairy recommend starting cpap betwee 7-15 cm H2O. Pt is agreeable to starting a new cpap. Pt uses [facility] and asked that the order be sent to [facility]. An appt was made for 05/17/16 at 2:20. Pt verbalized understanding of results. Pt had no questions at this time but was encouraged to call back if questions arise.

## 2016-03-09 NOTE — Telephone Encounter (Signed)
Please call Sai Radcliffe at [phone], or [phone]

## 2016-05-13 ENCOUNTER — Telehealth: Payer: Self-pay | Admitting: Neurology

## 2016-05-13 NOTE — Telephone Encounter (Signed)
Pt called said he does not have CPAP yet, so his appt for 10/2 was c/a. He sts he "is having problems with [facility]- He is very concerned, he said they are acting like they don't care". pls call

## 2016-05-13 NOTE — Telephone Encounter (Signed)
I spoke to pt. He says that [facility] has not been able to get him a cpap. They "act like they don't know what is going on". I asked him if he would prefer that I send a message to [facility] to try and figure out what is going on or if he would rather switch DMEs. He said he wants me to contact [facility] first and see what they say, and then if they still don't get him a cpap, then he wants to switch DMEs. I will contact [facility] and see what is going on. Pt knows to call me when he gets his cpap so we can make a follow up appt.

## 2016-05-17 ENCOUNTER — Ambulatory Visit: Payer: Self-pay | Admitting: Neurology

## 2016-05-17 NOTE — Telephone Encounter (Signed)
Received this notice from [facility]: I have emailed the [facility] to get status  and to see what the issue is. I do apologize and  I will get resolution to the problem to get the  patient his new machine.   I am very sorry about this.

## 2016-05-21 DIAGNOSIS — G4733 Obstructive sleep apnea (adult) (pediatric): Secondary | ICD-10-CM | POA: Diagnosis not present

## 2016-06-21 DIAGNOSIS — G4733 Obstructive sleep apnea (adult) (pediatric): Secondary | ICD-10-CM | POA: Diagnosis not present

## 2016-07-21 DIAGNOSIS — G4733 Obstructive sleep apnea (adult) (pediatric): Secondary | ICD-10-CM | POA: Diagnosis not present

## 2016-07-22 ENCOUNTER — Ambulatory Visit (INDEPENDENT_AMBULATORY_CARE_PROVIDER_SITE_OTHER): Payer: Medicare HMO | Admitting: Neurology

## 2016-07-22 ENCOUNTER — Encounter: Payer: Self-pay | Admitting: Neurology

## 2016-07-22 VITALS — BP 130/82 | HR 78 | Resp 16 | Ht 67.0 in | Wt 236.0 lb

## 2016-07-22 DIAGNOSIS — I10 Essential (primary) hypertension: Secondary | ICD-10-CM | POA: Diagnosis not present

## 2016-07-22 DIAGNOSIS — Z9989 Dependence on other enabling machines and devices: Secondary | ICD-10-CM | POA: Diagnosis not present

## 2016-07-22 DIAGNOSIS — E65 Localized adiposity: Secondary | ICD-10-CM

## 2016-07-22 DIAGNOSIS — G4733 Obstructive sleep apnea (adult) (pediatric): Secondary | ICD-10-CM

## 2016-07-22 HISTORY — DX: Localized adiposity: E65

## 2016-07-22 NOTE — Progress Notes (Signed)
SLEEP MEDICINE CLINIC   Provider:  Larey Weiss, M D  Referring Provider: Glendale Chard, MD Primary Care Physician:  Kevin Greenland, MD  Chief Complaint  Patient presents with  . Sleep Apnea    Rm 10. F/U. Patient states that he is doing well with CPAP. No new concerns.     HPI:  Kevin Weiss is a 75 y.o. male , seen here as a referral from Dr. Baird Cancer / [name], NP for a sleep consultation.    Chief complaint according to patient :Kevin Weiss has used a CPAP machine for the last 10 years that is no longer supported and cannot get parts for it.  He was seen for a colonoscopy at Dr [facility] office and she found him apneic while the anesthesia wore off He underwent a sleep study well over a decade ago, 2004,  at [facility] with [facility]. The studies are not available for me here today to review. He also reported that he was initially placed on a interface that started to irritate the nasal labial fold and the skin of the upper lip. He would also like a new interface per se. He does have a larger neck circumference and retrognathia. His comorbidities are hypertension controlled on Norvasc, amlodipine and lisinopril-hydrochlorothiazide.  He also takes vitamin D3 and aspirin last sleep study according to his primary care records was in October 2004 he was using [facility] for supplies I was able to see his last laboratory results his hemoglobin A1c was 5.9, total cholesterol 175, BUN 14 calcium 9.8 creatinine 1.1 potassium 4.1 sodium 135 with a CBC and differential were normal.  Sleep habits are as follows: The patient retired 5 years ago, he now is still up at 1 AM and sleeps 7-8 hours per night. He can sleep in as desired. He watches Tv and his computer before going to bed. He has neither screen in his bedroom.  His bedroom is quiet and cool, he sleeps usually through the night, has rare bathroom breaks. His wife goes several times and this sometimes wakes him. He  sleeps supine because of the CPAP interface. He used to be a side sleeper. He is keen on changing to a nasal pillow. He is not longer a mouth breather. He rises spontaneously at about 8.30. He does not nap in daytime. When he rises he feels restored and refreshed. He has neither dry mouth nor headaches in the morning. In the morning he will have a cup of coffee with breakfast and plans the day as he likes. His spouse is now retired as well. He drives long distances without feeling tired. Six-hour drives aren't  problem for him.   " CPAP changed my life"  Sleep medical history and family sleep history:  Nobody diagnosed with apnea. He was  sleepy after age 63, following weight gain. .  Social history:  Married, retired from [employer], Estate agent. Former shift work.   Interval history from 07/22/2016, [name] had unfortunately a little oddysee before receiving his new CPAP machine. He underwent a sleep study on 02/18/2016 which confirmed that he still has apnea with an AHI of 25.7 and an RDI of the same degree. He was not able to enter REM sleep in the first 2 hours of the study and there was no significant accentuation in supine sleep position. He was titrated beginning at 5 cm water pressure to a final pressure of 10 cm but he seemed to have done the  best at 11 cm setting his AHI was reduced to 2.0 and we have ordered an auto titration for him. He finally received a new machine through [facility] November 2017. He has noted that he is able to dream more since using CPAP, he has an 87% compliance for 6 hours and 13 minutes of daily use on average. The AutoSet allows a range between 7 and 15 cm water with 3 cm EPR, and the residual AHI is reduced to 2.1 He was a CPAP user prior and has not had any difficulties with using this new machine. He also likes his current interface, he has changed to a nasal pillow.  His sleep habits have not changed but on average he sleeps 7 hours at night,  remembers that he dreams more now, and feels refreshed and restored in the morning. He does not suffer from excessive daytime sleepiness. He is no longer snoring.  Review of Systems: Out of a complete 14 system review, the patient complains of only the following symptoms, and all other reviewed systems are negative. Snoring, shift Insurance underwriter. Every once in a while he will have a bathroom break interrupting his sleep but not nightly.  Epworth score 2 , Fatigue severity score 41  , depression score 2/ 15   Social History   Social History  . Marital status: Married    Spouse name: N/A  . Number of children: N/A  . Years of education: N/A   Occupational History  . Not on file.   Social History Main Topics  . Smoking status: Never Smoker  . Smokeless tobacco: Never Used  . Alcohol use Yes     Comment: rarely  . Drug use:     Types: Marijuana     Comment: 3 days ago  . Sexual activity: Yes   Other Topics Concern  . Not on file   Social History Narrative  . No narrative on file    No family history on file.  Past Medical History:  Diagnosis Date  . Arthritis   . Heart murmur at birth   . History of colon polyps   . Hypertension    takes Amlodipine and Prinizide daily  . Joint pain   . Sleep apnea     Past Surgical History:  Procedure Laterality Date  . COLONOSCOPY    . KNEE ARTHROSCOPY Left 15+yrs ago  . TOTAL HIP ARTHROPLASTY Right 03/12/2014   Procedure: TOTAL HIP ARTHROPLASTY ANTERIOR APPROACH;  Surgeon: Hessie Dibble, MD;  Location: [facility];  Service: Orthopedics;  Laterality: Right;    Current Outpatient Prescriptions  Medication Sig Dispense Refill  . amLODipine (NORVASC) 5 MG tablet Take 5 mg by mouth daily.    Marland Kitchen aspirin EC 325 MG EC tablet Take 1 tablet (325 mg total) by mouth 2 (two) times daily after a meal. 60 tablet 0  . Cholecalciferol (VITAMIN D-3) 1000 UNITS CAPS Take 1 capsule by mouth daily.    Marland Kitchen lisinopril-hydrochlorothiazide (PRINZIDE,ZESTORETIC)  10-12.5 MG per tablet Take 1 tablet by mouth daily.     No current facility-administered medications for this visit.     Allergies as of 07/22/2016  . (No Known Allergies)    Vitals: BP 130/82   Pulse 78   Resp 16   Ht 5\' 7"  (1.702 m)   Wt 236 lb (107 kg)   BMI 36.96 kg/m  Last Weight:  Wt Readings from Last 1 Encounters:  07/22/16 236 lb (107 kg)   [id number] mass index is 36.96  kg/m.     Last Height:   Ht Readings from Last 1 Encounters:  07/22/16 5\' 7"  (1.702 m)    Physical exam:  General: The patient is awake, alert and appears not in acute distress. The patient is well groomed. Head: Normocephalic, atraumatic. Neck is supple. Mallampati  4 /  Bruxism - uses mouth guard 18.75 inches neck circumference:. Nasal airflow open, Retrognathia is seen. Chin beard.  Cardiovascular:  Regular rate and rhythm, without  murmurs or carotid bruit, and without distended neck veins. Respiratory: Lungs are clear to auscultation. Skin:  Without evidence of edema, or rash Trunk: BMI is elevated  The patient's posture is erect   Neurologic exam : The patient is awake and alert, oriented to place and time.   Memory subjective  described as intact. Attention span & concentration ability appears normal.  Speech is fluent,  without dysarthria, dysphonia or aphasia.  Mood and affect are appropriate.  Cranial nerves: Pupils are equal and briskly reactive to light. Funduscopic exam without evidence of pallor or edema.  Extraocular movements  in vertical and horizontal planes intact and without nystagmus. Visual fields by finger perimetry are intact. Hearing to finger rub intact. Facial sensation intact to fine touch.Facial motor strength is symmetric and tongue and uvula move midline. Shoulder shrug was symmetrical.   Motor exam:Normal tone, muscle bulk and symmetric strength in all extremities. Deep tendon reflexes: in the  upper and lower extremities are symmetric and intact. Babinski  maneuver response is downgoing.  The patient was advised of the nature of the diagnosed sleep disorder , the treatment options and risks for general a health and wellness arising from not treating the condition.  I spent more than 25 minutes of face to face time with the patient. Greater than 50% of time was spent in counseling and coordination of care. We have discussed the diagnosis and differential and I answered the patient's questions.     Assessment:  After physical and neurologic examination, review of laboratory studies,  Personal review of imaging studies, reports of other /same  Imaging studies ,  Results of polysomnography/ neurophysiology testing and pre-existing records as far as provided in visit., my assessment is   1) Mr. Berney has used CPAP for about 13 years and needed a new machine.  He has been happy with his new autotitrator, a ResMed air since machine, and his residual AHI has been 2.1. The pressure window is between 7 and 15 cm water and his 95th percentile pressure at night is 14.3, very close to the upper range. I will offer to lift the maximum pressure from 15-16 cm to allow a little bit more room.  Rv in one year. Kevin Weiss will get an order to adjust pressure.       Asencion Partridge Celines Femia MD  07/22/2016   CC: Kevin [facility], [address] [place], [place] [address]

## 2016-08-20 DIAGNOSIS — G4733 Obstructive sleep apnea (adult) (pediatric): Secondary | ICD-10-CM | POA: Diagnosis not present

## 2016-08-21 DIAGNOSIS — G4733 Obstructive sleep apnea (adult) (pediatric): Secondary | ICD-10-CM | POA: Diagnosis not present

## 2016-09-07 DIAGNOSIS — G4733 Obstructive sleep apnea (adult) (pediatric): Secondary | ICD-10-CM | POA: Diagnosis not present

## 2016-09-21 DIAGNOSIS — G4733 Obstructive sleep apnea (adult) (pediatric): Secondary | ICD-10-CM | POA: Diagnosis not present

## 2016-10-08 DIAGNOSIS — G4733 Obstructive sleep apnea (adult) (pediatric): Secondary | ICD-10-CM | POA: Diagnosis not present

## 2016-10-19 DIAGNOSIS — G4733 Obstructive sleep apnea (adult) (pediatric): Secondary | ICD-10-CM | POA: Diagnosis not present

## 2016-11-09 DIAGNOSIS — G4733 Obstructive sleep apnea (adult) (pediatric): Secondary | ICD-10-CM | POA: Diagnosis not present

## 2016-11-19 DIAGNOSIS — G4733 Obstructive sleep apnea (adult) (pediatric): Secondary | ICD-10-CM | POA: Diagnosis not present

## 2016-12-07 DIAGNOSIS — N5201 Erectile dysfunction due to arterial insufficiency: Secondary | ICD-10-CM | POA: Diagnosis not present

## 2016-12-10 DIAGNOSIS — G4733 Obstructive sleep apnea (adult) (pediatric): Secondary | ICD-10-CM | POA: Diagnosis not present

## 2016-12-19 DIAGNOSIS — G4733 Obstructive sleep apnea (adult) (pediatric): Secondary | ICD-10-CM | POA: Diagnosis not present

## 2017-01-12 DIAGNOSIS — G4733 Obstructive sleep apnea (adult) (pediatric): Secondary | ICD-10-CM | POA: Diagnosis not present

## 2017-01-19 DIAGNOSIS — G4733 Obstructive sleep apnea (adult) (pediatric): Secondary | ICD-10-CM | POA: Diagnosis not present

## 2017-01-24 DIAGNOSIS — E669 Obesity, unspecified: Secondary | ICD-10-CM | POA: Diagnosis not present

## 2017-01-24 DIAGNOSIS — Z0000 Encounter for general adult medical examination without abnormal findings: Secondary | ICD-10-CM | POA: Diagnosis not present

## 2017-01-24 DIAGNOSIS — I10 Essential (primary) hypertension: Secondary | ICD-10-CM | POA: Diagnosis not present

## 2017-01-24 DIAGNOSIS — Z23 Encounter for immunization: Secondary | ICD-10-CM | POA: Diagnosis not present

## 2017-01-24 DIAGNOSIS — G4730 Sleep apnea, unspecified: Secondary | ICD-10-CM | POA: Diagnosis not present

## 2017-01-24 DIAGNOSIS — Z6836 Body mass index (BMI) 36.0-36.9, adult: Secondary | ICD-10-CM | POA: Diagnosis not present

## 2017-01-24 DIAGNOSIS — Z79899 Other long term (current) drug therapy: Secondary | ICD-10-CM | POA: Diagnosis not present

## 2017-02-12 DIAGNOSIS — G4733 Obstructive sleep apnea (adult) (pediatric): Secondary | ICD-10-CM | POA: Diagnosis not present

## 2017-02-18 DIAGNOSIS — G4733 Obstructive sleep apnea (adult) (pediatric): Secondary | ICD-10-CM | POA: Diagnosis not present

## 2017-03-15 DIAGNOSIS — G4733 Obstructive sleep apnea (adult) (pediatric): Secondary | ICD-10-CM | POA: Diagnosis not present

## 2017-04-15 DIAGNOSIS — G4733 Obstructive sleep apnea (adult) (pediatric): Secondary | ICD-10-CM | POA: Diagnosis not present

## 2017-06-14 DIAGNOSIS — G4733 Obstructive sleep apnea (adult) (pediatric): Secondary | ICD-10-CM | POA: Diagnosis not present

## 2017-07-15 DIAGNOSIS — G4733 Obstructive sleep apnea (adult) (pediatric): Secondary | ICD-10-CM | POA: Diagnosis not present

## 2017-07-25 ENCOUNTER — Ambulatory Visit: Payer: Medicare HMO | Admitting: Neurology

## 2017-07-27 ENCOUNTER — Encounter: Payer: Self-pay | Admitting: Neurology

## 2017-08-01 ENCOUNTER — Ambulatory Visit (INDEPENDENT_AMBULATORY_CARE_PROVIDER_SITE_OTHER): Payer: Medicare HMO | Admitting: Neurology

## 2017-08-01 ENCOUNTER — Encounter: Payer: Self-pay | Admitting: Neurology

## 2017-08-01 VITALS — BP 122/76 | HR 76 | Ht 68.0 in | Wt 247.0 lb

## 2017-08-01 DIAGNOSIS — G4733 Obstructive sleep apnea (adult) (pediatric): Secondary | ICD-10-CM | POA: Diagnosis not present

## 2017-08-01 DIAGNOSIS — Z9989 Dependence on other enabling machines and devices: Secondary | ICD-10-CM | POA: Diagnosis not present

## 2017-08-01 DIAGNOSIS — E669 Obesity, unspecified: Secondary | ICD-10-CM | POA: Diagnosis not present

## 2017-08-01 NOTE — Patient Instructions (Signed)

## 2017-08-01 NOTE — Progress Notes (Signed)
SLEEP MEDICINE CLINIC   Provider:  Larey Seat, M D  Referring Provider: Glendale Chard, MD Primary Care Physician:  Glendale Chard, MD  Chief Complaint  Patient presents with  . Follow-up    pt alone, rm 11. DME: [facility].     HPI:  Kevin Weiss is a 76 y.o. male , seen originally as a referral from Dr. Baird Cancer / [name], NP for a sleep consultation.    Chief complaint according to patient :Mr. Whittlesey has used a CPAP machine for the last 10 years that is no longer supported and cannot get parts for it.  He was seen for a colonoscopy at Dr [facility] office and she found him apneic while the anesthesia wore off He underwent a sleep study well over a decade ago, 2004,  at [facility] with [facility]. The studies are not available for me here today to review. He also reported that he was initially placed on a interface that started to irritate the nasal labial fold and the skin of the upper lip. He would also like a new interface per se. He does have a larger neck circumference and retrognathia. His comorbidities are hypertension controlled on Norvasc, amlodipine and lisinopril-hydrochlorothiazide.  He also takes vitamin D3 and aspirin last sleep study according to his primary care records was in October 2004 he was using [facility] for supplies I was able to see his last laboratory results his hemoglobin A1c was 5.9, total cholesterol 175, BUN 14 calcium 9.8 creatinine 1.1 potassium 4.1 sodium 135 with a CBC and differential were normal.  Sleep habits are as follows: The patient retired 5 years ago, he now is still up at 1 AM and sleeps 7-8 hours per night. He can sleep in as desired. He watches Tv and his computer before going to bed. He has neither screen in his bedroom.  His bedroom is quiet and cool, he sleeps usually through the night, has rare bathroom breaks. His wife goes several times and this sometimes wakes him. He sleeps supine because of the CPAP interface.  He used to be a side sleeper. He is keen on changing to a nasal pillow. He is not longer a mouth breather. He rises spontaneously at about 8.30. He does not nap in daytime. When he rises he feels restored and refreshed. He has neither dry mouth nor headaches in the morning. In the morning he will have a cup of coffee with breakfast and plans the day as he likes. His spouse is now retired as well. He drives long distances without feeling tired. Six-hour drives aren't  problem for him.   " CPAP changed my life"  Sleep medical history and family sleep history:  Nobody diagnosed with apnea. He was  sleepy after age 69, following weight gain. .  Social history:  Married, retired from [employer], Estate agent. Former shift work.   Interval history from 07/22/2016, Mr. Hasler had unfortunately a little oddysee before receiving his new CPAP machine. He underwent a sleep study on 02/18/2016 which confirmed that he still has  apnea with an AHI of 25.7 and an RDI of the same degree. He was not able to enter REM sleep in the first 2 hours of the study and there was no significant accentuation in supine sleep position. He was titrated beginning at 5 cm water pressure to a final pressure of 10 cm but he seemed to have done the best at 11 cm setting his AHI was reduced  to 2.0 and we have ordered an auto titration for him. He finally received a new machine through [facility] November 2017. He has noted that he is able to dream more since using CPAP, he has an 87% compliance for 6 hours and 13 minutes of daily use on average. The AutoSet allows a range between 7 and 15 cm water with 3 cm EPR, and the residual AHI is reduced to 2.1 He was a CPAP user prior and has not had any difficulties with using this new machine. He also likes his current interface, he has changed to a nasal pillow. His sleep habits have not changed but on average he sleeps 7 hours at night, remembers that he dreams more now, and feels  refreshed and restored in the morning. He does not suffer from excessive daytime sleepiness. He is no longer snoring.  Interval history from 01 August 2017, I follow Mr. Summons today who has been a compliant CPAP user for the last 12 months 100% compliance for the last 30 days has been confirmed he is using an AutoSet CPAP between 7 and 16 cm water pressure with 3 cm expiratory pressure relief.  This is a standard response machine percent residual AHI of 1.9.  Average use of time is 7 hours and 4 minutes each night, he does not have central apneas emerging.  No adjustments have to be made he has minimal air leaks, his 95th percentile pressure is 15.1 cm within the limits of his current settings he continues to endorse a high level of fatigue at 44 points, but not excessively daytime sleepy at one point on Epworth.  He is also not endorsing a significant level of depression. He feels fatigue is related to his obesity- he walks now 30 minutes 5 times a week. He wants to go back to the gym. His wife would join him.   Review of Systems: Out of a complete 14 system review, the patient complains of only the following symptoms, and all other reviewed systems are negative. Snoring, shift Insurance underwriter. Every once in a while he will have a bathroom break interrupting his sleep but not nightly.  Epworth score 1, Fatigue severity score 44  , depression score 2/ 15   Social History   Socioeconomic History  . Marital status: Married    Spouse name: Not on file  . Number of children: Not on file  . Years of education: Not on file  . Highest education level: Not on file  Social Needs  . Financial resource strain: Not on file  . Food insecurity - worry: Not on file  . Food insecurity - inability: Not on file  . Transportation needs - medical: Not on file  . Transportation needs - non-medical: Not on file  Occupational History  . Not on file  Tobacco Use  . Smoking status: Never Smoker  . Smokeless tobacco:  Never Used  Substance and Sexual Activity  . Alcohol use: Yes    Comment: rarely  . Drug use: Yes    Types: Marijuana    Comment: 3 days ago  . Sexual activity: Yes  Other Topics Concern  . Not on file  Social History Narrative  . Not on file    No family history on file.  Past Medical History:  Diagnosis Date  . Arthritis   . Heart murmur at birth   . History of colon polyps   . Hypertension    takes Amlodipine and Prinizide daily  . Joint pain   .  Sleep apnea     Past Surgical History:  Procedure Laterality Date  . COLONOSCOPY    . KNEE ARTHROSCOPY Left 15+yrs ago  . TOTAL HIP ARTHROPLASTY Right 03/12/2014   Procedure: TOTAL HIP ARTHROPLASTY ANTERIOR APPROACH;  Surgeon: Hessie Dibble, MD;  Location: [place];  Service: Orthopedics;  Laterality: Right;    Current Outpatient Medications  Medication Sig Dispense Refill  . amLODipine (NORVASC) 5 MG tablet Take 5 mg by mouth daily.    Marland Kitchen aspirin EC 325 MG EC tablet Take 1 tablet (325 mg total) by mouth 2 (two) times daily after a meal. 60 tablet 0  . Cholecalciferol (VITAMIN D-3) 1000 UNITS CAPS Take 1 capsule by mouth daily.    Marland Kitchen lisinopril-hydrochlorothiazide (PRINZIDE,ZESTORETIC) 10-12.5 MG per tablet Take 1 tablet by mouth daily.     No current facility-administered medications for this visit.     Allergies as of 08/01/2017  . (No Known Allergies)    Vitals: BP 122/76   Pulse 76   Ht 5\' 8"  (1.727 m)   Wt 247 lb (112 kg)   BMI 37.56 kg/m  Last Weight:  Wt Readings from Last 1 Encounters:  08/01/17 247 lb (112 kg)   NIO:EVOJ mass index is 37.56 kg/m.     Last Height:   Ht Readings from Last 1 Encounters:  08/01/17 5\' 8"  (1.727 m)    Physical exam:  General: The patient is awake, alert and appears not in acute distress. Head: Normocephalic, atraumatic. Neck is supple. Mallampati 4 / Bruxism - uses mouth guard 18.75 inches neck circumference:. Nasal airflow open, Retrognathia is seen. Chin beard.    Cardiovascular:  Regular rate and rhythm, without  murmurs or carotid bruit, and without distended neck veins. Respiratory: Lungs are clear to auscultation. Skin:  Without evidence of edema, or rash Trunk: BMI is elevated= 38.  The patient's posture is mildly stooped.  Neurologic exam : The patient is awake and alert, oriented to place and time.   Cranial nerves: Pupils are equal and briskly reactive to light. Visual fields by finger perimetry are intact. Hearing to finger rub intact. Facial sensation intact to fine touch. Facial motor strength is symmetric and tongue and uvula move midline. Shoulder shrug was symmetrical.  Motor exam: status post hip surgery - right side. Normal tone, muscle bulk and symmetric strength in all extremities. Deep tendon reflexes: in the  upper and lower extremities are symmetric and intact- DTR 2.  The patient was advised of the nature of the diagnosed sleep disorder , the treatment options and risks for general a health and wellness arising from not treating the condition.  I spent more than 15 minutes of face to face time with the patient. Greater than 50% of time was spent in counseling and coordination of care. We have discussed the diagnosis and differential and I answered the patient's questions.    Assessment:  After physical and neurologic examination, review of laboratory studies,  Personal review of imaging studies, reports of other /same  Imaging studies ,  Results of polysomnography/ neurophysiology testing and pre-existing records as far as provided in visit., my assessment is   1) Mr. Jain has used CPAP for about 13 years and needed a new machine, received pone in 2017 and has been highly compliant since. .  He has been happy with his new autotitrator, a ResMed air since machine, and his residual AHI has been 1.9.   Rv in one year.  [facility] is his DME- he  is OK with their services.   Asencion Partridge Paolo Okane MD  08/01/2017   CC: Glendale Chard,  [address] [place], [place] [address]

## 2017-08-04 DIAGNOSIS — R7303 Prediabetes: Secondary | ICD-10-CM | POA: Diagnosis not present

## 2017-08-04 DIAGNOSIS — E669 Obesity, unspecified: Secondary | ICD-10-CM | POA: Diagnosis not present

## 2017-08-04 DIAGNOSIS — Z6838 Body mass index (BMI) 38.0-38.9, adult: Secondary | ICD-10-CM | POA: Diagnosis not present

## 2017-08-04 DIAGNOSIS — I10 Essential (primary) hypertension: Secondary | ICD-10-CM | POA: Diagnosis not present

## 2017-08-04 DIAGNOSIS — G4730 Sleep apnea, unspecified: Secondary | ICD-10-CM | POA: Diagnosis not present

## 2017-08-15 DIAGNOSIS — G4733 Obstructive sleep apnea (adult) (pediatric): Secondary | ICD-10-CM | POA: Diagnosis not present

## 2017-09-09 DIAGNOSIS — L218 Other seborrheic dermatitis: Secondary | ICD-10-CM | POA: Diagnosis not present

## 2017-09-15 DIAGNOSIS — G4733 Obstructive sleep apnea (adult) (pediatric): Secondary | ICD-10-CM | POA: Diagnosis not present

## 2017-10-05 DIAGNOSIS — I10 Essential (primary) hypertension: Secondary | ICD-10-CM | POA: Diagnosis not present

## 2017-10-05 DIAGNOSIS — M79671 Pain in right foot: Secondary | ICD-10-CM | POA: Diagnosis not present

## 2017-10-17 ENCOUNTER — Ambulatory Visit (INDEPENDENT_AMBULATORY_CARE_PROVIDER_SITE_OTHER): Payer: Medicare HMO | Admitting: Podiatry

## 2017-10-17 ENCOUNTER — Ambulatory Visit: Payer: Self-pay

## 2017-10-17 DIAGNOSIS — G4733 Obstructive sleep apnea (adult) (pediatric): Secondary | ICD-10-CM | POA: Diagnosis not present

## 2017-10-17 DIAGNOSIS — M7661 Achilles tendinitis, right leg: Secondary | ICD-10-CM | POA: Diagnosis not present

## 2017-10-17 DIAGNOSIS — M79671 Pain in right foot: Secondary | ICD-10-CM

## 2017-10-17 MED ORDER — MELOXICAM 15 MG PO TABS: 15.0000 mg | ORAL_TABLET | Freq: Every day | ORAL | 1 refills | Status: AC

## 2017-10-17 NOTE — Progress Notes (Signed)
   Subjective:    Patient ID: Kevin Weiss, male    DOB: [birth year], 77 y.o.   MRN: [id number]  HPI    Review of Systems  All other systems reviewed and are negative.      Objective:   Physical Exam        Assessment & Plan:

## 2017-10-18 NOTE — Progress Notes (Signed)
   HPI: 77 year old male presenting today as a new patient with a chief complaint of a sharp, burning sensation to the posterior right heel that began 2 months ago. He reports an associated lesion to the area. Wearing shoes increases the pain. He has been applying OTC hydrocortisone cream to the area with no significant relief. He denies any trauma or injury. Patient is here for further evaluation and treatment.   Past Medical History:  Diagnosis Date  . Arthritis   . Heart murmur at birth   . History of colon polyps   . Hypertension    takes Amlodipine and Prinizide daily  . Joint pain   . Sleep apnea       Physical Exam: General: The patient is alert and oriented x3 in no acute distress.  Dermatology: Skin is warm, dry and supple bilateral lower extremities. Negative for open lesions or macerations.  Vascular: Palpable pedal pulses bilaterally. No edema or erythema noted. Capillary refill within normal limits.  Neurological: Epicritic and protective threshold grossly intact bilaterally.   Musculoskeletal Exam: Pain on palpation noted to the posterior tubercle of the right calcaneus at the insertion of the Achilles tendon consistent with retrocalcaneal bursitis. Range of motion within normal limits. Muscle strength 5/5 in all muscle groups bilateral lower extremities.  Assessment: 1. Insertional Achilles tendinitis right 2. Retrocalcaneal bursitis   Plan of Care:  1. Patient was evaluated.  2. Prescription for Mobic 15 mg provided to patient.  3. Silicone heel sleeve dispensed.  4. Recommended good shoe gear.  5. Return to clinic as needed.    Edrick Kins, DPM [facility]  Dr. Edrick Kins, [address] [place]                                        [place], [place] [address]                Office [phone]  Fax [phone]

## 2017-11-17 DIAGNOSIS — G4733 Obstructive sleep apnea (adult) (pediatric): Secondary | ICD-10-CM | POA: Diagnosis not present

## 2017-12-19 DIAGNOSIS — G4733 Obstructive sleep apnea (adult) (pediatric): Secondary | ICD-10-CM | POA: Diagnosis not present

## 2018-01-19 DIAGNOSIS — G4733 Obstructive sleep apnea (adult) (pediatric): Secondary | ICD-10-CM | POA: Diagnosis not present

## 2018-02-01 DIAGNOSIS — I10 Essential (primary) hypertension: Secondary | ICD-10-CM | POA: Diagnosis not present

## 2018-02-01 DIAGNOSIS — Z8719 Personal history of other diseases of the digestive system: Secondary | ICD-10-CM | POA: Diagnosis not present

## 2018-02-01 DIAGNOSIS — R945 Abnormal results of liver function studies: Secondary | ICD-10-CM | POA: Diagnosis not present

## 2018-02-01 DIAGNOSIS — G4730 Sleep apnea, unspecified: Secondary | ICD-10-CM | POA: Diagnosis not present

## 2018-02-01 DIAGNOSIS — E785 Hyperlipidemia, unspecified: Secondary | ICD-10-CM | POA: Diagnosis not present

## 2018-02-01 DIAGNOSIS — Z1389 Encounter for screening for other disorder: Secondary | ICD-10-CM | POA: Diagnosis not present

## 2018-02-01 DIAGNOSIS — E669 Obesity, unspecified: Secondary | ICD-10-CM | POA: Diagnosis not present

## 2018-02-13 DIAGNOSIS — R945 Abnormal results of liver function studies: Secondary | ICD-10-CM | POA: Diagnosis not present

## 2018-02-20 DIAGNOSIS — G4733 Obstructive sleep apnea (adult) (pediatric): Secondary | ICD-10-CM | POA: Diagnosis not present

## 2018-02-23 ENCOUNTER — Telehealth: Payer: Self-pay | Admitting: *Deleted

## 2018-02-23 DIAGNOSIS — I441 Atrioventricular block, second degree: Secondary | ICD-10-CM | POA: Diagnosis not present

## 2018-02-23 DIAGNOSIS — R7309 Other abnormal glucose: Secondary | ICD-10-CM | POA: Diagnosis not present

## 2018-02-23 DIAGNOSIS — Z0001 Encounter for general adult medical examination with abnormal findings: Secondary | ICD-10-CM | POA: Diagnosis not present

## 2018-02-23 DIAGNOSIS — G4730 Sleep apnea, unspecified: Secondary | ICD-10-CM | POA: Diagnosis not present

## 2018-02-23 DIAGNOSIS — I739 Peripheral vascular disease, unspecified: Secondary | ICD-10-CM | POA: Diagnosis not present

## 2018-02-23 DIAGNOSIS — N529 Male erectile dysfunction, unspecified: Secondary | ICD-10-CM | POA: Diagnosis not present

## 2018-02-23 DIAGNOSIS — Z1212 Encounter for screening for malignant neoplasm of rectum: Secondary | ICD-10-CM | POA: Diagnosis not present

## 2018-02-23 DIAGNOSIS — I10 Essential (primary) hypertension: Secondary | ICD-10-CM | POA: Diagnosis not present

## 2018-02-23 DIAGNOSIS — R3915 Urgency of urination: Secondary | ICD-10-CM | POA: Diagnosis not present

## 2018-02-23 DIAGNOSIS — Z125 Encounter for screening for malignant neoplasm of prostate: Secondary | ICD-10-CM | POA: Diagnosis not present

## 2018-02-23 DIAGNOSIS — M79652 Pain in left thigh: Secondary | ICD-10-CM | POA: Diagnosis not present

## 2018-02-23 MED ORDER — MELOXICAM 15 MG PO TABS: 15.0000 mg | ORAL_TABLET | Freq: Every day | ORAL | 2 refills | Status: DC

## 2018-02-23 NOTE — Telephone Encounter (Signed)
Refill request for meloxicam. Dr. Amalia Hailey states refill for #[phone] +2additional and pt needs to be seen if pain continues.

## 2018-02-27 ENCOUNTER — Telehealth: Payer: Self-pay | Admitting: Podiatry

## 2018-02-27 NOTE — Telephone Encounter (Signed)
Pt called because he began taking Meloxicam in March and when he went in to see his primary, they noticed his Liver enzymes were up. Pt called to see if he should continue taking the medication. I spoke with Val and she advised that pt. Should not continue with the Meloxicam and to see his Primary Doctor.

## 2018-02-28 ENCOUNTER — Ambulatory Visit
Admission: RE | Admit: 2018-02-28 | Discharge: 2018-02-28 | Disposition: A | Discharge status: Home / Self Care | Payer: Medicare Other | Source: Ambulatory Visit | Attending: Internal Medicine | Admitting: Internal Medicine

## 2018-02-28 ENCOUNTER — Other Ambulatory Visit: Payer: Self-pay | Admitting: Internal Medicine

## 2018-02-28 DIAGNOSIS — M898X5 Other specified disorders of bone, thigh: Secondary | ICD-10-CM

## 2018-02-28 DIAGNOSIS — M1612 Unilateral primary osteoarthritis, left hip: Secondary | ICD-10-CM | POA: Diagnosis not present

## 2018-02-28 DIAGNOSIS — R52 Pain, unspecified: Secondary | ICD-10-CM

## 2018-02-28 IMAGING — CR DG FEMUR 2+V*L*
4 series · 4 of 4 positions shown · non-contrast
Comparison: None.

CLINICAL DATA: Left femur pain.  No reported injury.

EXAM:
LEFT FEMUR 2 VIEWS

[t femur with hip  ap left]
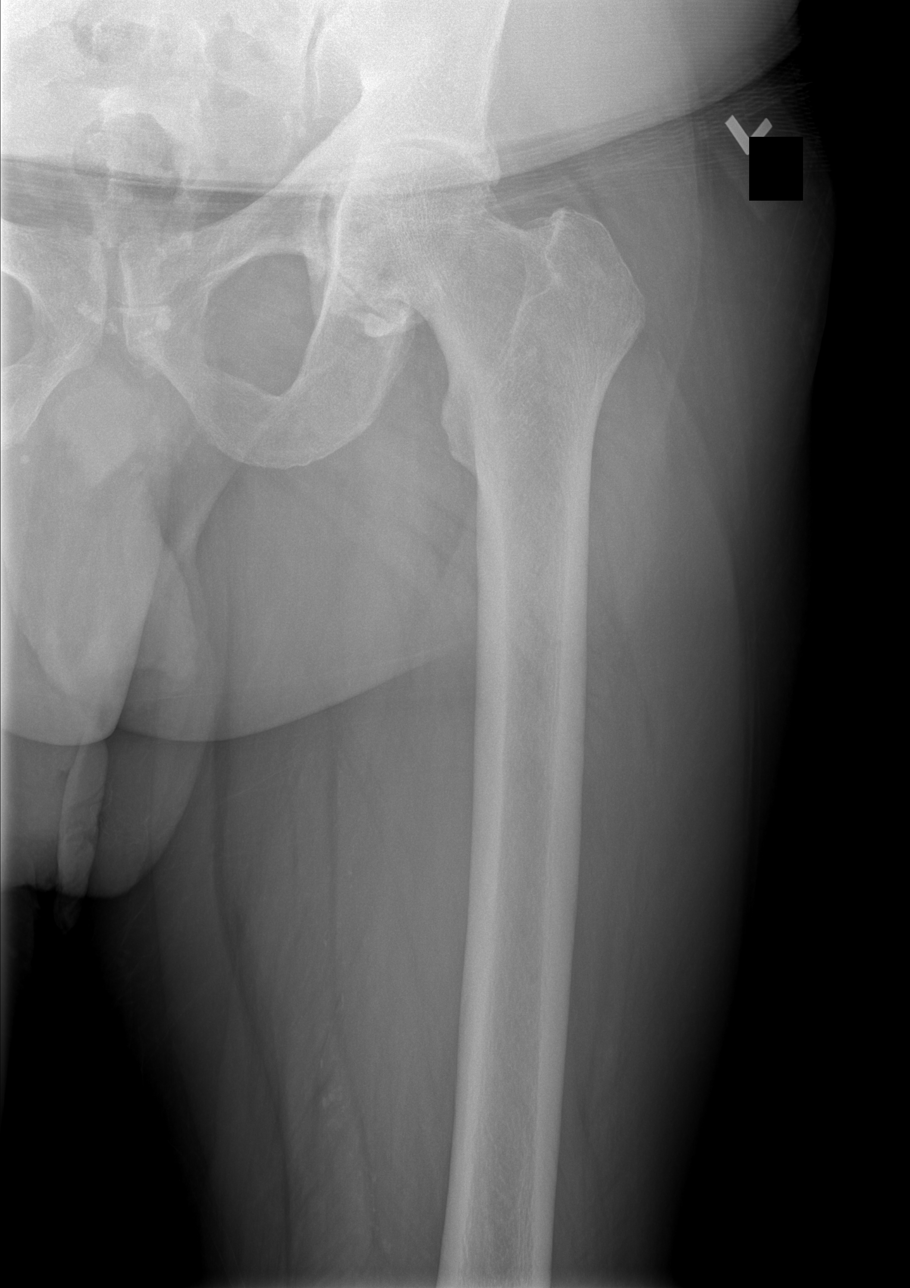

[t femur with knee ap left]
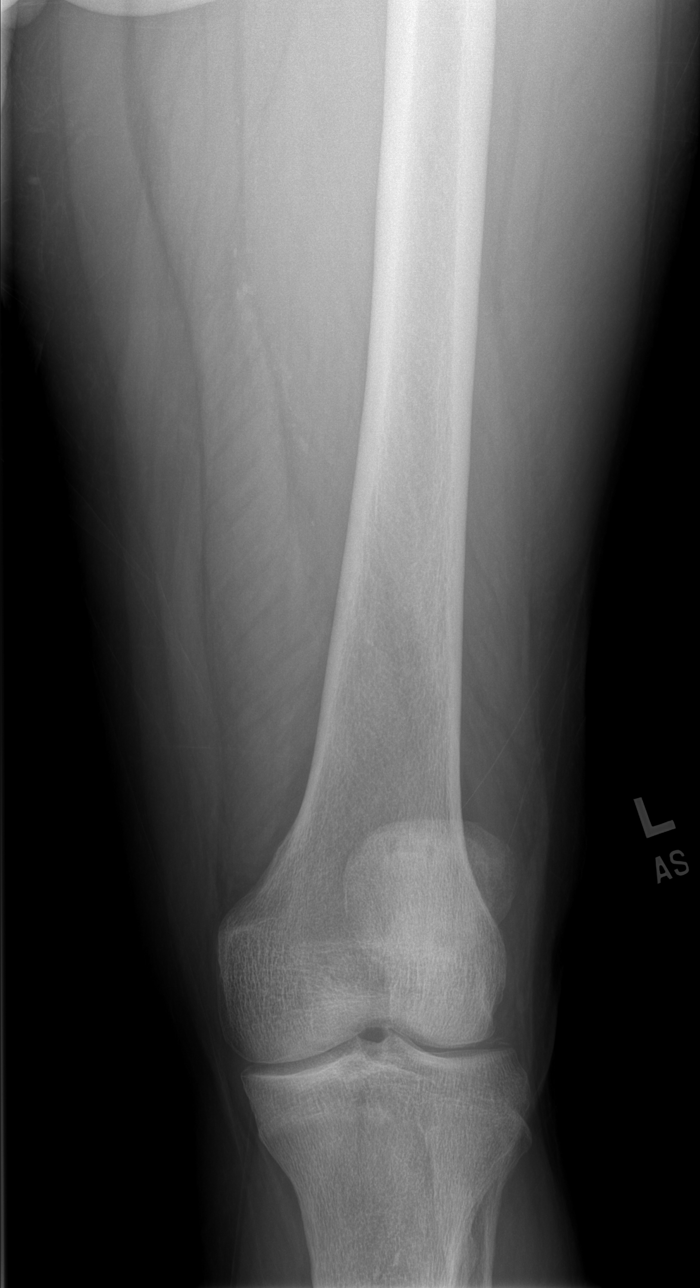

[t femur with hip lat left *]
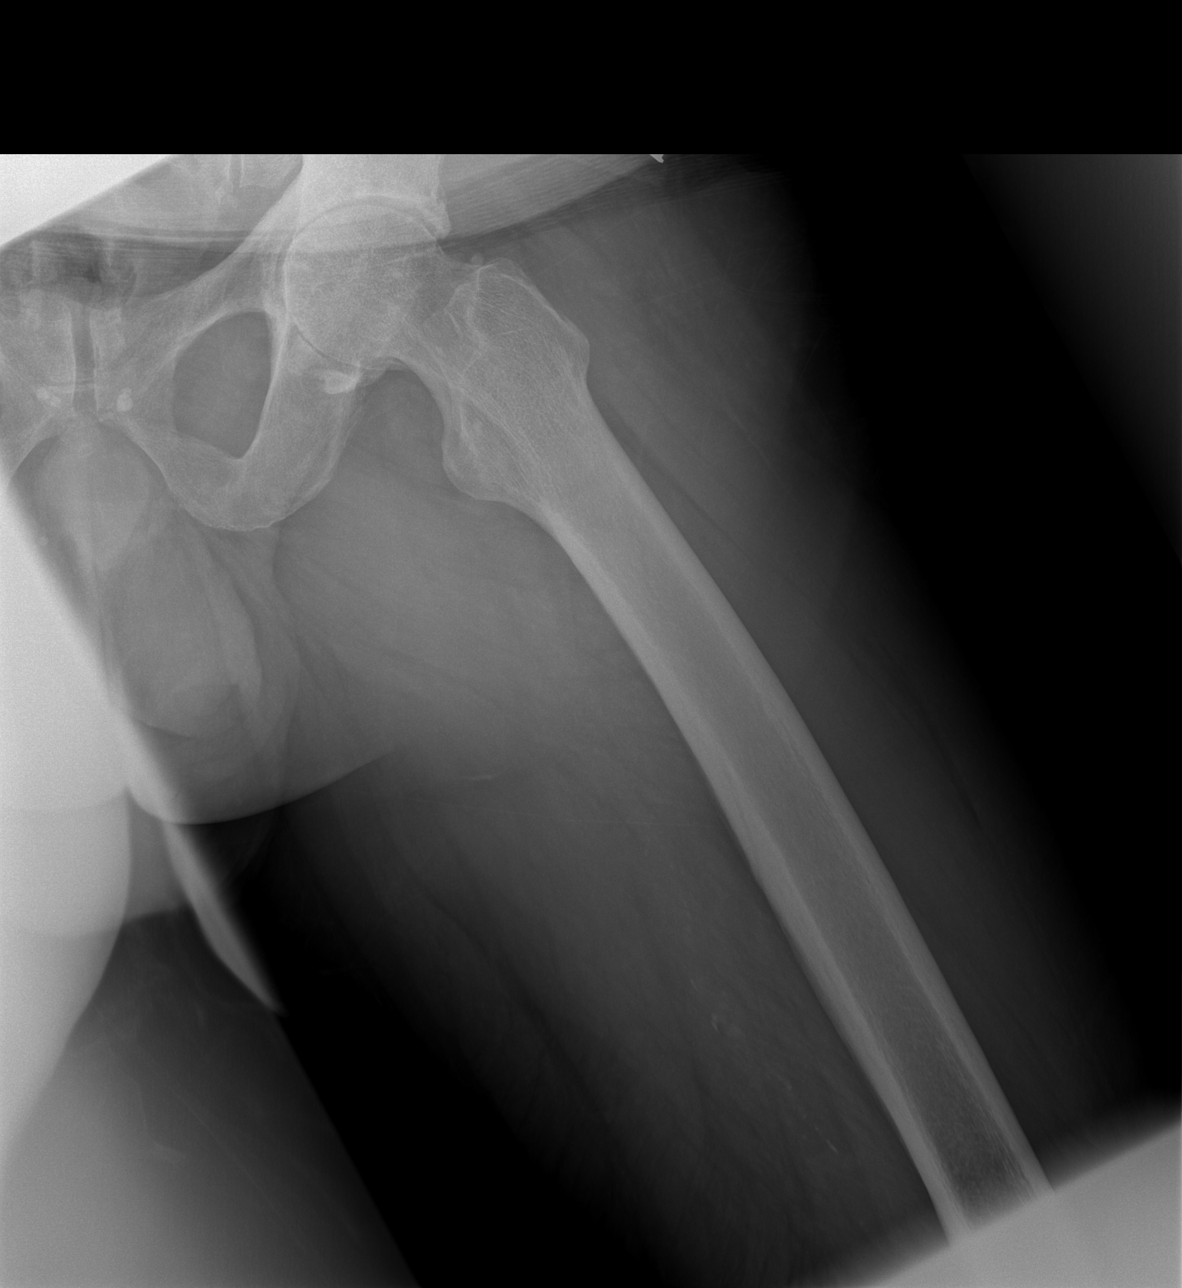

[t femur with knee lat left *]
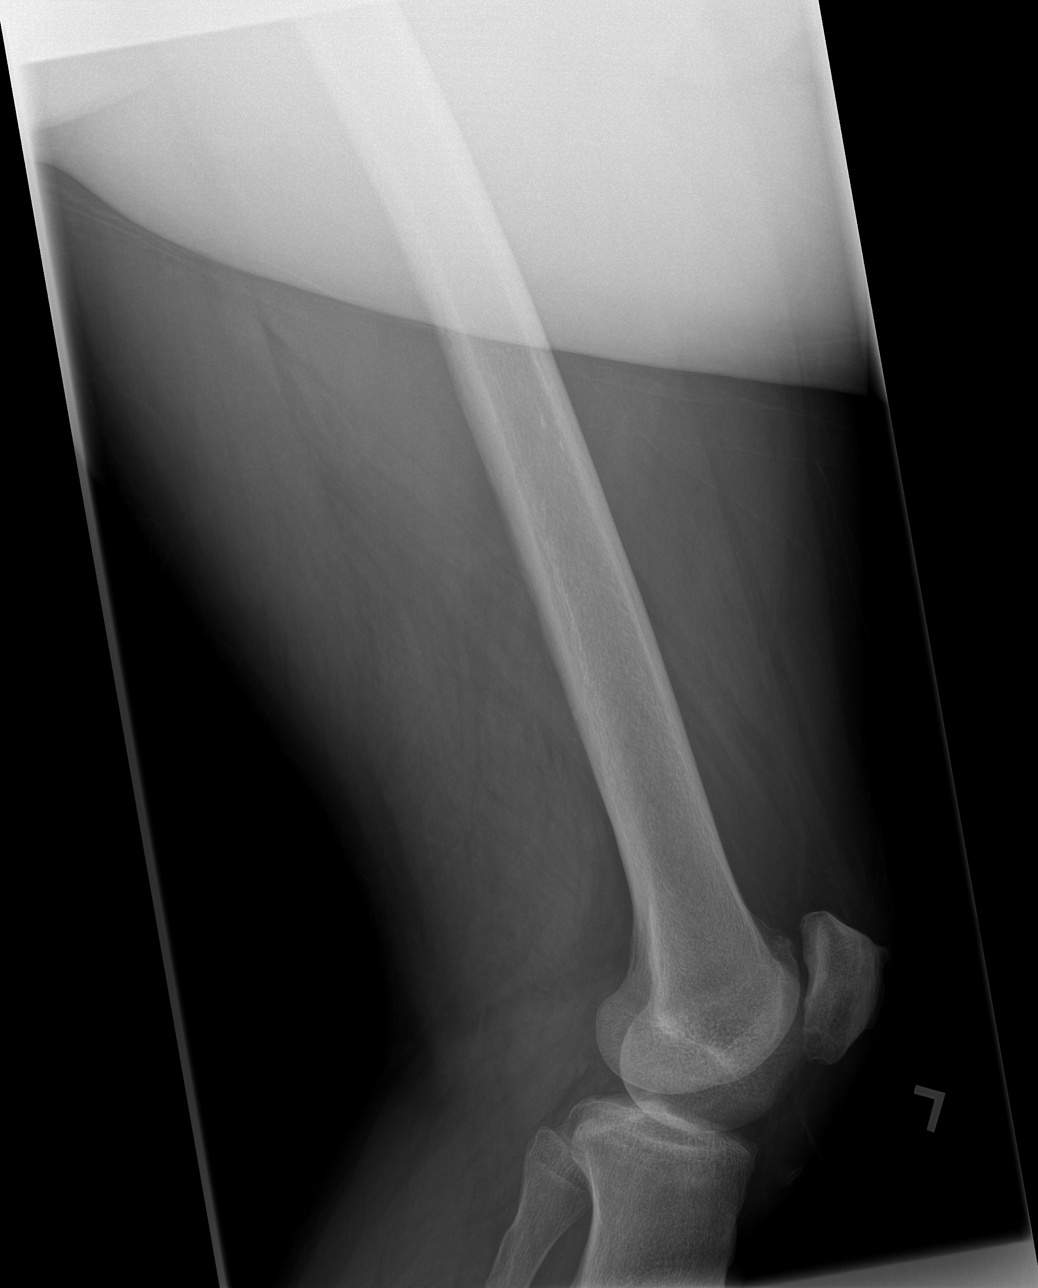

[4 of 4 positions shown; findings below may reference images not displayed]

FINDINGS: No fracture. No suspicious focal osseous lesion. Chondrocalcinosis
in the lateral meniscus of the left knee. No radiopaque foreign
body. Mild osteoarthritis in the left hip joint.
IMPRESSION: 1. No fracture or focal osseous lesion.
2. Lateral meniscal chondrocalcinosis in the left knee.
3. Mild left hip osteoarthritis.

## 2018-03-03 DIAGNOSIS — M79652 Pain in left thigh: Secondary | ICD-10-CM | POA: Diagnosis not present

## 2018-03-06 ENCOUNTER — Other Ambulatory Visit (HOSPITAL_COMMUNITY): Payer: Self-pay | Admitting: Internal Medicine

## 2018-03-06 DIAGNOSIS — I10 Essential (primary) hypertension: Secondary | ICD-10-CM

## 2018-03-09 ENCOUNTER — Other Ambulatory Visit: Payer: Self-pay

## 2018-03-09 ENCOUNTER — Ambulatory Visit (HOSPITAL_COMMUNITY): Payer: Medicare HMO | Attending: Cardiology

## 2018-03-09 DIAGNOSIS — E785 Hyperlipidemia, unspecified: Secondary | ICD-10-CM | POA: Diagnosis not present

## 2018-03-09 DIAGNOSIS — Z8249 Family history of ischemic heart disease and other diseases of the circulatory system: Secondary | ICD-10-CM | POA: Diagnosis not present

## 2018-03-09 DIAGNOSIS — I080 Rheumatic disorders of both mitral and aortic valves: Secondary | ICD-10-CM | POA: Insufficient documentation

## 2018-03-09 DIAGNOSIS — I10 Essential (primary) hypertension: Secondary | ICD-10-CM | POA: Diagnosis not present

## 2018-03-13 ENCOUNTER — Other Ambulatory Visit: Payer: Self-pay

## 2018-03-13 DIAGNOSIS — M79605 Pain in left leg: Secondary | ICD-10-CM

## 2018-03-15 DIAGNOSIS — M1612 Unilateral primary osteoarthritis, left hip: Secondary | ICD-10-CM | POA: Diagnosis not present

## 2018-03-20 ENCOUNTER — Other Ambulatory Visit: Payer: Self-pay | Admitting: Orthopaedic Surgery

## 2018-03-23 DIAGNOSIS — G4733 Obstructive sleep apnea (adult) (pediatric): Secondary | ICD-10-CM | POA: Diagnosis not present

## 2018-03-28 ENCOUNTER — Telehealth: Payer: Self-pay

## 2018-03-28 NOTE — Telephone Encounter (Signed)
Referral sent to scheduling. Notes on file.

## 2018-03-29 ENCOUNTER — Telehealth: Payer: Self-pay

## 2018-03-29 NOTE — Telephone Encounter (Signed)
NOTES FAXED TO [facility] °

## 2018-03-29 NOTE — Progress Notes (Signed)
Cardiology Office Note   Date:  03/30/2018   ID:  Kevin Weiss, DOB 1941/07/07, MRN [id number]  PCP:  Glendale Chard, MD  Cardiologist:   No primary care provider on file. Referring: Rodriguez-Southworth, S*   Chief Complaint  Patient presents with  . Pre-op Exam  . Abnormal ECG      History of Present Illness: Kevin Weiss is a 77 y.o. male who is referred by Rodriguez-Southworth, S* for preop clearance prior to hip surgery.  Only complains of intermittent left sided chest discomfort that mostly occurs at rest for the past couple of months. States it feels more like heartburn and is sometimes relieved with TUMS, otherwise does not last long and dissipates by itself. Denies any associated new changes in shortness of breath, PND, orthopnea, or leg swelling. No heart palpitations. He has sleep apnea and wears a cpap.    He did have an echo in July with a well preserved EF of 50 - 55%  with mild LVH.  There was hypokinesis of the inferolateral wall.    Past Medical History:  Diagnosis Date  . Arthritis   . Cardiomegaly 10/13/2006   Qualifier: Diagnosis of  By: Eusebio Friendly    . CHF, EJECTION FRACTION > OR = 50% 10/13/2006   Qualifier: Diagnosis of  By: Eusebio Friendly    . Degenerative joint disease (DJD) of hip 03/12/2014  . GASTROESOPHAGEAL REFLUX, NO ESOPHAGITIS 10/13/2006   Qualifier: Diagnosis of  By: Eusebio Friendly    . HEMORRHOIDS, NOS 10/13/2006   Qualifier: Diagnosis of  By: Eusebio Friendly    . History of colon polyps   . Hypertension    takes Amlodipine and Prinizide daily  . 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 Morbid obesity ([place]) 11/03/2015  . OSA on CPAP 11/03/2015  . Retrognathia 11/03/2015    Past Surgical History:  Procedure Laterality Date  . COLONOSCOPY    . KNEE ARTHROSCOPY Left 15+yrs ago  . TOTAL HIP ARTHROPLASTY Right 03/12/2014   Procedure: TOTAL HIP ARTHROPLASTY ANTERIOR APPROACH;   Surgeon: Hessie Dibble, MD;  Location: [facility];  Service: Orthopedics;  Laterality: Right;     Current Outpatient Medications  Medication Sig Dispense Refill  . aspirin EC 81 MG tablet Take 81 mg by mouth daily.    . Cholecalciferol (VITAMIN D-3) 1000 UNITS CAPS Take 1 capsule by mouth daily.    . hydrochlorothiazide (MICROZIDE) 12.5 MG capsule Take 12.5 mg by mouth daily.    Marland Kitchen lisinopril (PRINIVIL,ZESTRIL) 10 MG tablet Take 10 mg by mouth daily.     No current facility-administered medications for this visit.     Allergies:   Patient has no known allergies.    Social History:  The patient  reports that he has quit smoking. He has a 2.50 pack-year smoking history. He has never used smokeless tobacco. He reports that he drinks alcohol. He reports that he has current or past drug history. Drug: Marijuana.   Family History:  The patient's family history includes Alzheimer's disease in his mother; Heart attack in his maternal grandmother; Heart disease in his father; Hyperlipidemia in his father and mother; Hypertension in his brother, father, mother, and sister.    ROS:  Please see the history of present illness.   Otherwise, review of systems are positive for none.   All other systems are reviewed and negative.    PHYSICAL EXAM: VS:  BP 118/83 (BP  Location: Left Arm)   Pulse 75   Ht 5\' 8"  (1.727 m)   Wt 250 lb 12.8 oz (113.8 kg)   BMI 38.13 kg/m  , BMI Body mass index is 38.13 kg/m. GENERAL:  Well appearing NECK:  No jugular venous distention, waveform within normal limits, carotid upstroke brisk and symmetric, no bruits, no thyromegaly LYMPHATICS:  No cervical, inguinal adenopathy LUNGS:  Clear to auscultation bilaterally CHEST:  Unremarkable HEART:  PMI not displaced or sustained, S1 within normal limits, paradoxical split S2, no S3, no S4, no clicks, no rubs, no murmurs ABD:  Flat, positive bowel sounds normal in frequency in pitch, no bruits, no rebound, no guarding, no  midline pulsatile mass, no hepatomegaly, no splenomegaly EXT:  2 plus pulses radial bilaterally. 1 plus pulses DP/PT bilaterally, no edema, no cyanosis no clubbing   EKG:  EKG is ordered today. The ekg ordered today demonstrates normal sinus rhythm ,rate 73, left axis, left anterior fascicular block, right bundle branch block with LVH.    Recent Labs: No results found for requested labs within last 8760 hours.    Lipid Panel No results found for: CHOL, TRIG, HDL, CHOLHDL, VLDL, LDLCALC, LDLDIRECT    Wt Readings from Last 3 Encounters:  03/30/18 250 lb 12.8 oz (113.8 kg)  08/01/17 247 lb (112 kg)  07/22/16 236 lb (107 kg)      Other studies Reviewed: Additional studies/ records that were reviewed today include: echocardiogram Review of the above records demonstrates: EF 50-55% with mild LVH, hypokinesis of inferolateral wall. Please see elsewhere in the note.    ASSESSMENT AND PLAN:  PREOP:  The patient is  going for a low risk surgery.   Per modified Lee criteria the estimated rate of myocardial infarction, pulmonary embolism, ventricular fibrillation, cardiac arrest or complete heart block for this patient is 0.4%.  Therefore, based on ACC/AHA guidelines, the patient would be at acceptable risk for the planned procedure without further cardiovascular testing.Cleared for hip surgery. EKG is unchanged from prior in 2015.    CHEST TIGHTNESS: Will not need any further evaluation at this time.   HYPERTENSION: On current medical therapy at target 118/83. No changes.   OBESITY: Discussed lifestyle and diet changes.   Current medicines are reviewed at length with the patient today.  The patient does not have concerns regarding medicines.  The following changes have been made:  no change  Labs/ tests ordered today include: EKG  Orders Placed This Encounter  Procedures  . EKG 12-Lead     Disposition:   FU with me in one year.    Signed, Minus Breeding, MD  03/30/2018 1:22  PM    [facility]

## 2018-03-30 ENCOUNTER — Other Ambulatory Visit: Payer: Self-pay | Admitting: Orthopaedic Surgery

## 2018-03-30 ENCOUNTER — Ambulatory Visit (INDEPENDENT_AMBULATORY_CARE_PROVIDER_SITE_OTHER): Payer: Medicare HMO | Admitting: Cardiology

## 2018-03-30 ENCOUNTER — Encounter: Payer: Self-pay | Admitting: Cardiology

## 2018-03-30 VITALS — BP 118/83 | HR 75 | Ht 68.0 in | Wt 250.8 lb

## 2018-03-30 DIAGNOSIS — I10 Essential (primary) hypertension: Secondary | ICD-10-CM | POA: Diagnosis not present

## 2018-03-30 DIAGNOSIS — R931 Abnormal findings on diagnostic imaging of heart and coronary circulation: Secondary | ICD-10-CM

## 2018-03-30 DIAGNOSIS — R9431 Abnormal electrocardiogram [ECG] [EKG]: Secondary | ICD-10-CM

## 2018-03-30 DIAGNOSIS — Z01810 Encounter for preprocedural cardiovascular examination: Secondary | ICD-10-CM | POA: Insufficient documentation

## 2018-03-30 HISTORY — DX: Abnormal findings on diagnostic imaging of heart and coronary circulation: R93.1

## 2018-03-30 NOTE — Pre-Procedure Instructions (Signed)
BUZZ AXEL  03/30/2018      [id number] - [place], [place] [address] Phone: [phone] Fax: [phone]    Your procedure is scheduled on August 27  Report to [facility] [place] at 1100 A.M.  Call this number if you have problems the morning of surgery:  [phone]   Remember:  Do not eat or drink after midnight.     Take these medicines the morning of surgery with A SIP OF WATER NONE  7 days prior to surgery STOP taking any Aspirin(unless otherwise instructed by your surgeon), Aleve, Naproxen, Ibuprofen, Motrin, Advil, Goody's, BC's, all herbal medications, fish oil, and all vitamins  Follow your surgeon's instructions on when to stop Asprin.  If no instructions were given by your surgeon then you will need to call the office to get those instructions.       Do not wear jewelry  Do not wear lotions, powders, or cologne, or deodorant.   Men may shave face and neck.  Do not bring valuables to the hospital.  [facility] is not responsible for any belongings or valuables.  Contacts, dentures or bridgework may not be worn into surgery.  Leave your suitcase in the car.  After surgery it may be brought to your room.  For patients admitted to the hospital, discharge time will be determined by your treatment team.  Patients discharged the day of surgery will not be allowed to drive home.    Special instructions:   [place]- Preparing For Surgery  Before surgery, you can play an important role. Because skin is not sterile, your skin needs to be as free of germs as possible. You can reduce the number of germs on your skin by washing with CHG (chlorahexidine gluconate) Soap before surgery.  CHG is an antiseptic cleaner which kills germs and bonds with the skin to continue killing germs even after washing.    Oral Hygiene is also important to reduce your risk of infection.  Remember - BRUSH YOUR  TEETH THE MORNING OF SURGERY WITH YOUR REGULAR TOOTHPASTE  Please do not use if you have an allergy to CHG or antibacterial soaps. If your skin becomes reddened/irritated stop using the CHG.  Do not shave (including legs and underarms) for at least 48 hours prior to first CHG shower. It is OK to shave your face.  Please follow these instructions carefully.   1. Shower the NIGHT BEFORE SURGERY and the MORNING OF SURGERY with CHG.   2. If you chose to wash your hair, wash your hair first as usual with your normal shampoo.  3. After you shampoo, rinse your hair and body thoroughly to remove the shampoo.  4. Use CHG as you would any other liquid soap. You can apply CHG directly to the skin and wash gently with a scrungie or a clean washcloth.   5. Apply the CHG Soap to your body ONLY FROM THE NECK DOWN.  Do not use on open wounds or open sores. Avoid contact with your eyes, ears, mouth and genitals (private parts). Wash Face and genitals (private parts)  with your normal soap.  6. Wash thoroughly, paying special attention to the area where your surgery will be performed.  7. Thoroughly rinse your body with warm water from the neck down.  8. DO NOT shower/wash with your normal soap after using and rinsing off the CHG Soap.  9. Pat yourself dry with a  CLEAN TOWEL.  10. Wear CLEAN PAJAMAS to bed the night before surgery, wear comfortable clothes the morning of surgery  11. Place CLEAN SHEETS on your bed the night of your first shower and DO NOT SLEEP WITH PETS.    Day of Surgery:  Do not apply any deodorants/lotions.  Please wear clean clothes to the hospital/surgery center.   Remember to brush your teeth WITH YOUR REGULAR TOOTHPASTE.    Please read over the following fact sheets that you were given.

## 2018-03-30 NOTE — Patient Instructions (Signed)

## 2018-03-31 ENCOUNTER — Ambulatory Visit (HOSPITAL_COMMUNITY)
Admission: RE | Admit: 2018-03-31 | Discharge: 2018-03-31 | Disposition: A | Discharge status: Home / Self Care | Payer: Medicare HMO | Source: Ambulatory Visit | Attending: Orthopaedic Surgery | Admitting: Orthopaedic Surgery

## 2018-03-31 ENCOUNTER — Encounter (HOSPITAL_COMMUNITY): Payer: Self-pay

## 2018-03-31 ENCOUNTER — Other Ambulatory Visit: Payer: Self-pay

## 2018-03-31 ENCOUNTER — Encounter (HOSPITAL_COMMUNITY)
Admission: RE | Admit: 2018-03-31 | Discharge: 2018-03-31 | Disposition: A | Discharge status: Home / Self Care | Payer: Medicare HMO | Source: Ambulatory Visit | Attending: Orthopaedic Surgery | Admitting: Orthopaedic Surgery

## 2018-03-31 DIAGNOSIS — Z01818 Encounter for other preprocedural examination: Secondary | ICD-10-CM | POA: Diagnosis not present

## 2018-03-31 DIAGNOSIS — Z01812 Encounter for preprocedural laboratory examination: Secondary | ICD-10-CM | POA: Diagnosis present

## 2018-03-31 DIAGNOSIS — R0789 Other chest pain: Secondary | ICD-10-CM | POA: Diagnosis not present

## 2018-03-31 LAB — URINALYSIS, ROUTINE W REFLEX MICROSCOPIC
Bacteria, UA: NONE SEEN
Bilirubin Urine: NEGATIVE
Glucose, UA: NEGATIVE mg/dL
KETONES UR: NEGATIVE mg/dL
Leukocytes, UA: NEGATIVE
Nitrite: NEGATIVE
PH: 5 (ref 5.0–8.0)
Protein, ur: NEGATIVE mg/dL
SPECIFIC GRAVITY, URINE: 1.013 (ref 1.005–1.030)

## 2018-03-31 LAB — BASIC METABOLIC PANEL
Anion gap: 10 (ref 5–15)
BUN: 16 mg/dL (ref 8–23)
CHLORIDE: 102 mmol/L (ref 98–111)
CO2: 26 mmol/L (ref 22–32)
Calcium: 9.8 mg/dL (ref 8.9–10.3)
Creatinine, Ser: 1.18 mg/dL (ref 0.61–1.24)
GFR calc Af Amer: >60 mL/min (ref >60)
GFR calc non Af Amer: 59 mL/min — ABNORMAL LOW (ref >60)
Glucose, Bld: 134 mg/dL — ABNORMAL HIGH (ref 70–99)
POTASSIUM: 3.7 mmol/L (ref 3.5–5.1)
Sodium: 138 mmol/L (ref 135–145)

## 2018-03-31 LAB — CBC WITH DIFFERENTIAL/PLATELET
Abs Immature Granulocytes: 0 10*3/uL (ref 0.0–0.1)
Basophils Absolute: 0.1 10*3/uL (ref 0.0–0.1)
Basophils Relative: 1 %
EOS PCT: 6 %
Eosinophils Absolute: 0.3 10*3/uL (ref 0.0–0.7)
HEMATOCRIT: 49.1 % (ref 39.0–52.0)
HEMOGLOBIN: 15.8 g/dL (ref 13.0–17.0)
IMMATURE GRANULOCYTES: 0 %
LYMPHS ABS: 2.1 10*3/uL (ref 0.7–4.0)
LYMPHS PCT: 40 %
MCH: 28.2 pg (ref 26.0–34.0)
MCHC: 32.2 g/dL (ref 30.0–36.0)
MCV: 87.5 fL (ref 78.0–100.0)
MONOS PCT: 10 %
Monocytes Absolute: 0.5 10*3/uL (ref 0.1–1.0)
NEUTROS PCT: 43 %
Neutro Abs: 2.2 10*3/uL (ref 1.7–7.7)
Platelets: 233 10*3/uL (ref 150–400)
RBC: 5.61 MIL/uL (ref 4.22–5.81)
RDW: 14.1 % (ref 11.5–15.5)
WBC: 5.2 10*3/uL (ref 4.0–10.5)

## 2018-03-31 LAB — SURGICAL PCR SCREEN
MRSA, PCR: NEGATIVE
STAPHYLOCOCCUS AUREUS: NEGATIVE

## 2018-03-31 LAB — APTT: aPTT: 28 seconds (ref 24–36)

## 2018-03-31 LAB — PROTIME-INR
INR: 0.97
Prothrombin Time: 12.8 seconds (ref 11.4–15.2)

## 2018-03-31 IMAGING — CR DG CHEST 2V
2 series · 2 of 2 positions shown · non-contrast
Comparison: Chest radiograph [DATE]

CLINICAL DATA: Preoperative evaluation

EXAM:
CHEST - 2 VIEW

[w chest pa]
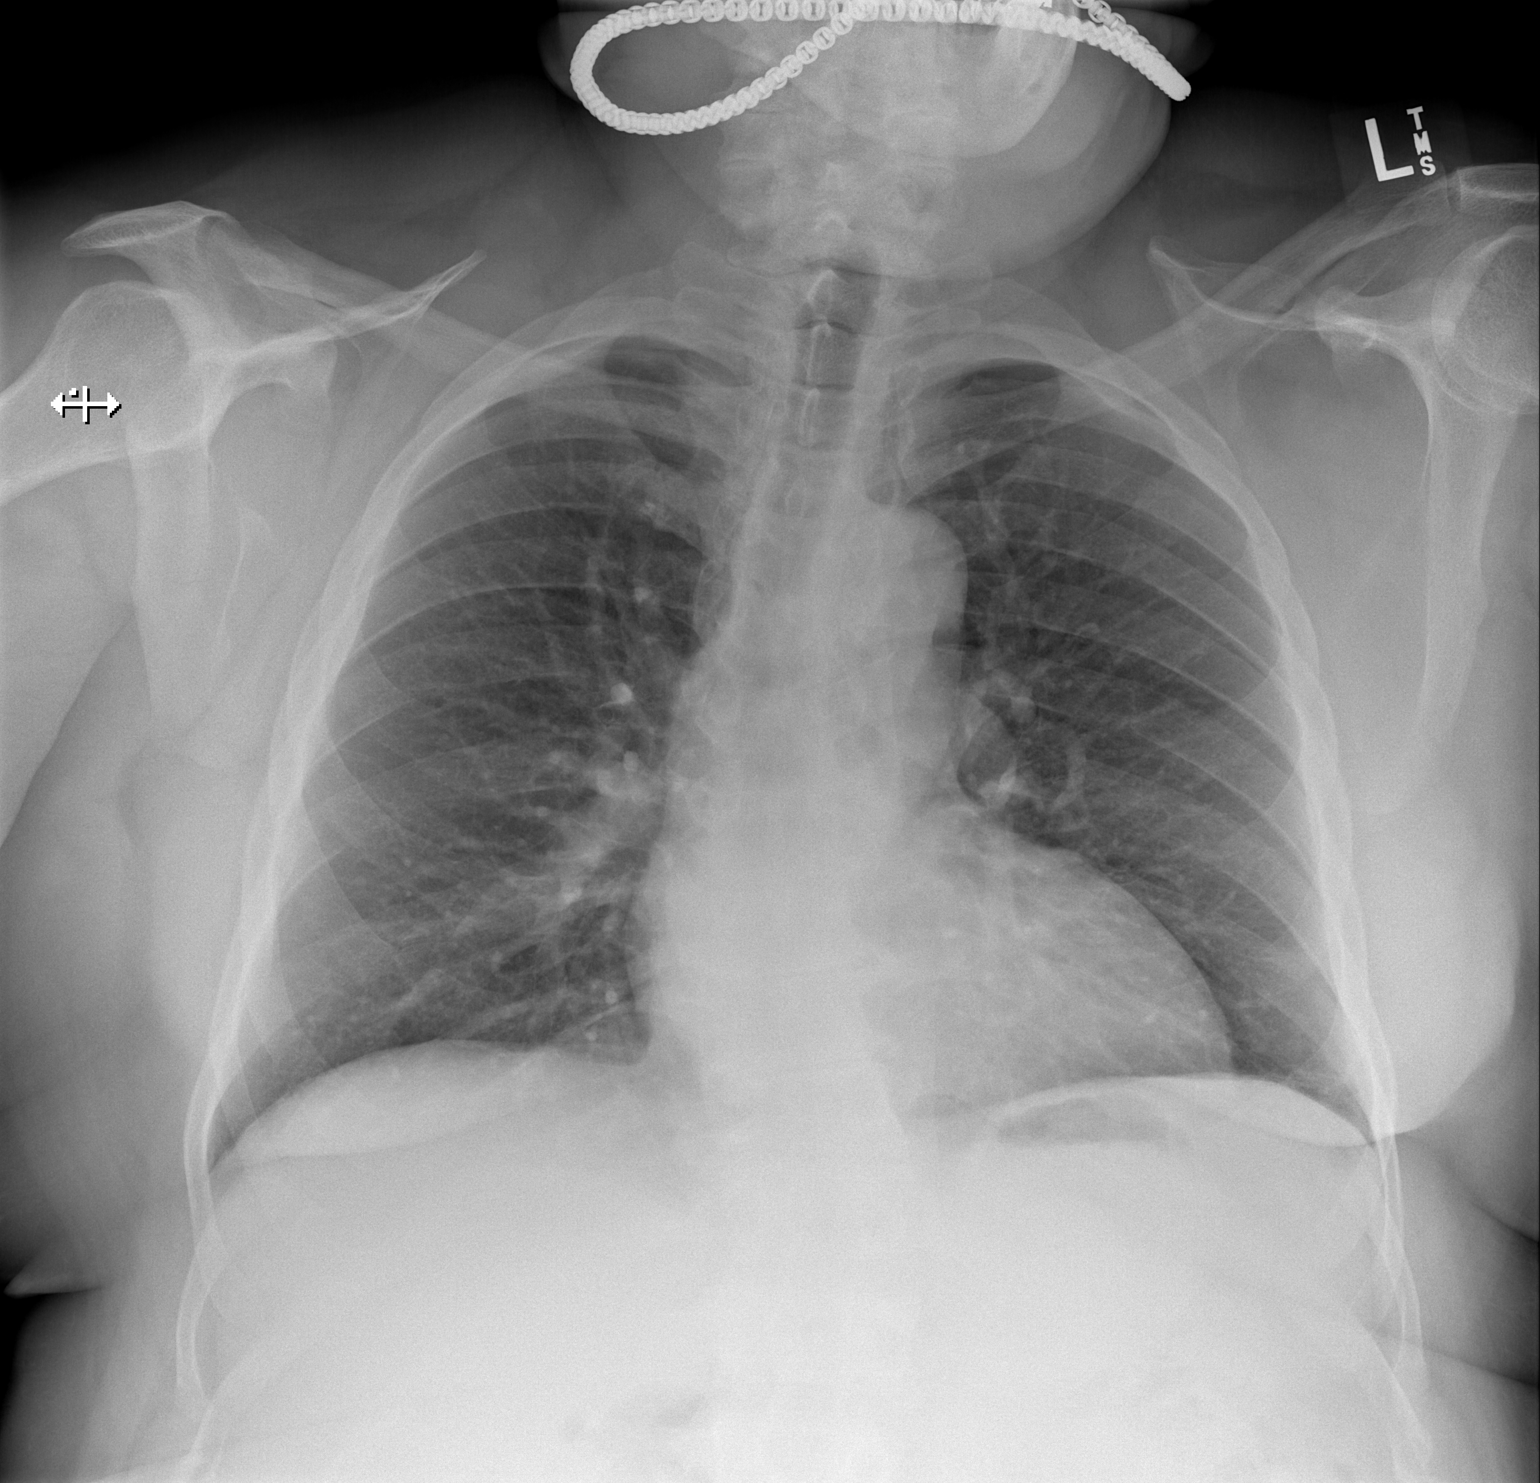

[w chest lat]
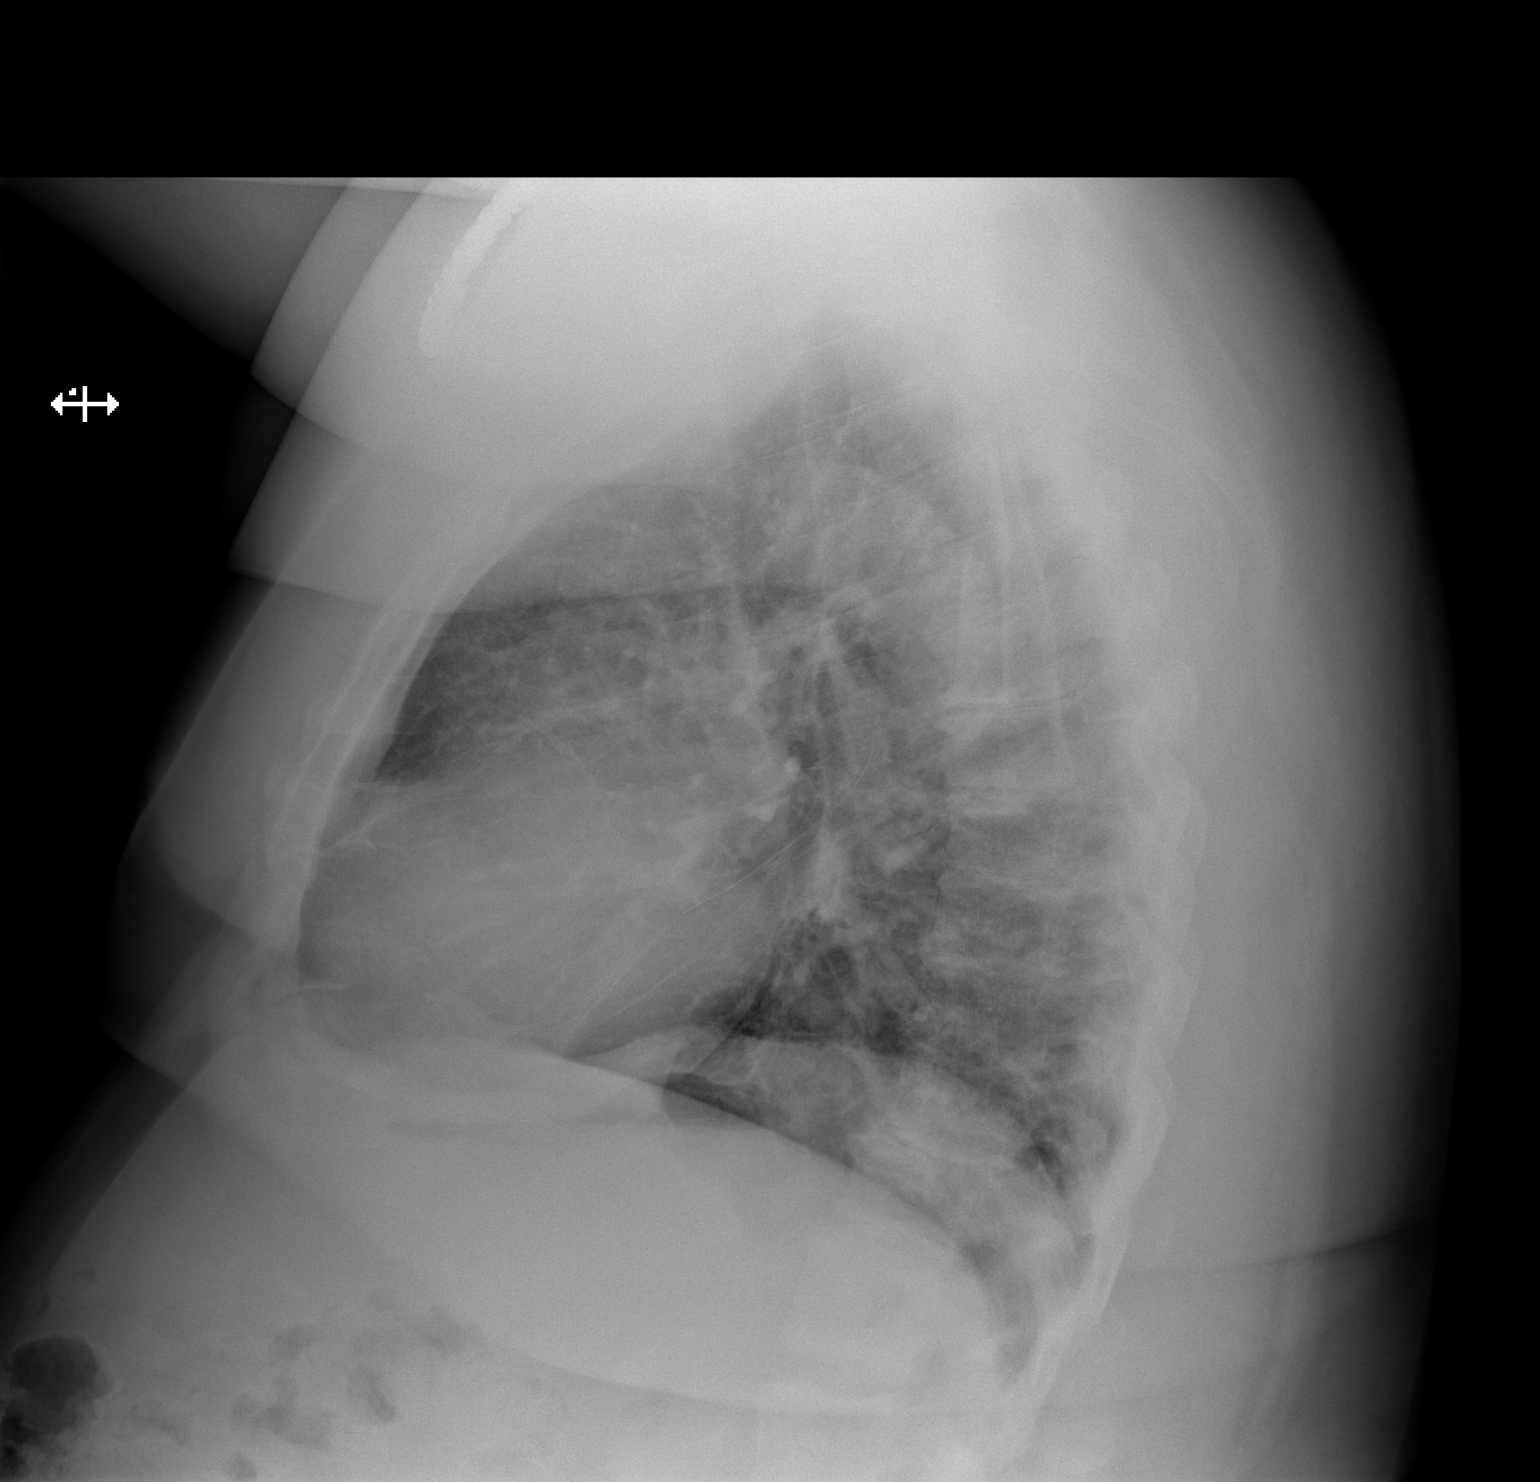

[2 of 2 positions shown; findings below may reference images not displayed]

FINDINGS: Stable cardiac and mediastinal contours. No consolidative pulmonary
opacities. No pleural effusion or pneumothorax. Thoracic spine
degenerative changes.
IMPRESSION: No acute cardiopulmonary process.

## 2018-03-31 NOTE — Progress Notes (Addendum)
PCP - Glendale Chard Cardiologist - [place]  Chest x-ray - 03/31/18 EKG - 03/30/18 Stress Test - denies ECHO - 2019 Cardiac Cathdenies -   Wears CPAP at night settings at 5 - instructed to bring mask and tubing the day of surgery   Aspirin Instructions: stop 7 days  Anesthesia review: yes, EKG  Patient denies shortness of breath, fever, cough and chest pain at PAT appointment   Patient verbalized understanding of instructions that were given to them at the PAT appointment. Patient was also instructed that they will need to review over the PAT instructions again at home before surgery.

## 2018-04-03 NOTE — Progress Notes (Addendum)
Anesthesia Chart Review:  Case:  [id number] Date/Time:  04/11/18 1245   Procedure:  TOTAL HIP ARTHROPLASTY ANTERIOR APPROACH (Left )   Anesthesia type:  Spinal   Pre-op diagnosis:  LEFT HIP DEGENERATIVE JOINT DISEASE   Location:  [facility] OR ROOM 05 / [facility]   Surgeon:  Melrose Nakayama, MD      DISCUSSION: Patient is a 77 year old male scheduled for the above procedure.   History includes former smoker, OSA (CPAP), HTN, cardiomegaly, retrognathia,  BMI is consistent with obesity.   He had a preoperative cardiology evaluation due to abnormal EKG and inferolateral hypokinesis (EF 50-55%) on recent echo. Cardiologist Dr. Percival Spanish did not recommend additional testing and cleared patient for surgery at "acceptable risk."   He denied SOB, cough, fever, and chest pain at his PAT visit. He has cardiac clearance following recent evaluation. If no acute changes then I would anticipate that he can proceed as planned.   VS: BP 137/71   Pulse 88   Temp 37 C   Resp 20   Ht 5\' 8"  (1.727 m)   Wt 114.5 kg   SpO2 98%   BMI 38.39 kg/m   PROVIDERS: Glendale Chard, MD is PCP. Seen by Audery Amel, PA-C in July, and an echo was ordered following an abnormal EKG. Patient was later referred to cardiology. Vascular surgery referral also made for PVD (scheduled to see Servando Snare, MD on 05/05/18). Minus Breeding, MD is cardiologist. He saw patient on 03/30/18 for preoperative evaluation for abnormal EKG with recent echo showing EF 50-55% with hypokinesis of the inferolateral wall.  Patient had intermittent brief left chest discomfort primarily at rest and felt more like heartburn.  EKG was felt to be stable Following evaluation, Dr. Percival Spanish wrote: "PREOP:  The patient is  going for a low risk surgery.   Per modified Lee criteria the estimated rate of myocardial infarction, pulmonary embolism, ventricular fibrillation, cardiac arrest or complete heart block for this patient is 0.4%.  Therefore, based on ACC/AHA  guidelines, the patient would be at acceptable risk for the planned procedure without further cardiovascular testing.Cleared for hip surgery. EKG is unchanged from prior in 2015.   CHEST TIGHTNESS: Will not need any further evaluation at this time."  (In the more distant past, he was seen by cardiologist Adrian Prows, MD with [facility] for chest pain and had a negative ETT.)  LABS: Labs reviewed: Acceptable for surgery. (all labs ordered are listed, but only abnormal results are displayed)  Labs Reviewed  BASIC METABOLIC PANEL - Abnormal; Notable for the following components:      Result Value   Glucose, Bld 134 (*)    GFR calc non Af Amer 59 (*)    All other components within normal limits  URINALYSIS, ROUTINE W REFLEX MICROSCOPIC - Abnormal; Notable for the following components:   Color, Urine STRAW (*)    Hgb urine dipstick SMALL (*)    All other components within normal limits  SURGICAL PCR SCREEN  APTT  CBC WITH DIFFERENTIAL/PLATELET  PROTIME-INR  TYPE AND SCREEN    IMAGES: CXR 03/31/18:  IMPRESSION: No acute cardiopulmonary process.   EKG: 03/30/18 ([facility]): NSR, left anterior fascicular block, right bundle branch block with LVH. Cannot rule out anterior infarct, age undetermined.   CV: Echo 03/09/18: Study Conclusions - Left ventricle: The cavity size was normal. Wall thickness was   increased in a pattern of mild LVH. There was mild focal basal   hypertrophy of the  septum. Systolic function was normal. The   estimated ejection fraction was in the range of 50% to 55%. There   is hypokinesis of the inferolateral myocardium. Doppler   parameters are consistent with abnormal left ventricular   relaxation (grade 1 diastolic dysfunction). - Aortic valve: There was mild regurgitation. - Mitral valve: There was mild regurgitation. Impressions: - Hypokinesis of the inferolateral wall with overall preserved LV   systolic function; mild diastolic  dysfunction; mild LVH; mild AI   and MR.  Exercise stress test 01/04/12 [facility]; scanned under Media tab, Correspondence 03/12/14): Negative for ischemia, normal BP response. Aggressive risk modification recommended.    Past Medical History:  Diagnosis Date  . Arthritis   . Cardiomegaly 10/13/2006   Qualifier: Diagnosis of  By: Eusebio Friendly    . CHF, EJECTION FRACTION > OR = 50% 10/13/2006   Qualifier: Diagnosis of  By: Eusebio Friendly    . Degenerative joint disease (DJD) of hip 03/12/2014  . GASTROESOPHAGEAL REFLUX, NO ESOPHAGITIS 10/13/2006   patient denies  . HEMORRHOIDS, NOS 10/13/2006   Qualifier: Diagnosis of  By: Eusebio Friendly    . History of colon polyps   . Hypertension    takes Amlodipine and Prinizide daily  . 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 Morbid obesity ([place]) 11/03/2015  . OSA on CPAP 11/03/2015  . Retrognathia 11/03/2015    Past Surgical History:  Procedure Laterality Date  . COLONOSCOPY    . KNEE ARTHROSCOPY Left 15+yrs ago  . TOTAL HIP ARTHROPLASTY Right 03/12/2014   Procedure: TOTAL HIP ARTHROPLASTY ANTERIOR APPROACH;  Surgeon: Hessie Dibble, MD;  Location: [place];  Service: Orthopedics;  Laterality: Right;    MEDICATIONS: . aspirin EC 81 MG tablet  . Cholecalciferol (VITAMIN D-3) 1000 UNITS CAPS  . hydrochlorothiazide (MICROZIDE) 12.5 MG capsule  . lisinopril (PRINIVIL,ZESTRIL) 10 MG tablet   No current facility-administered medications for this encounter.   Holding ASA 7 days prior to surgery.  [facility]Anesthesiology Phone [phone] 04/04/2018 9:11 AM

## 2018-04-10 MED ORDER — TRANEXAMIC ACID 1000 MG/10ML IV SOLN
1000.0000 mg | INTRAVENOUS | Status: AC
  Administered 2018-04-11: 1000 mg via INTRAVENOUS
  Filled 2018-04-10: qty 1000

## 2018-04-10 MED ORDER — CEFAZOLIN SODIUM-DEXTROSE 2-4 GM/100ML-% IV SOLN
2.0000 g | INTRAVENOUS | Status: AC
  Administered 2018-04-11: 2 g via INTRAVENOUS
  Filled 2018-04-10: qty 100

## 2018-04-10 MED ORDER — LACTATED RINGERS IV SOLN
INTRAVENOUS | Status: DC
  Administered 2018-04-11 (×2): via INTRAVENOUS

## 2018-04-10 MED ORDER — BUPIVACAINE LIPOSOME 1.3 % IJ SUSP
10.0000 mL | INTRAMUSCULAR | Status: DC
  Filled 2018-04-10: qty 10

## 2018-04-10 MED ORDER — TRANEXAMIC ACID 1000 MG/10ML IV SOLN
2000.0000 mg | INTRAVENOUS | Status: DC
  Filled 2018-04-10: qty 20

## 2018-04-10 NOTE — H&P (Signed)
TOTAL HIP ADMISSION H&P  Patient is admitted for left total hip arthroplasty.  Subjective:  Chief Complaint: left hip pain  HPI: Kevin Weiss, 77 y.o. male, has a history of pain and functional disability in the left hip(s) due to arthritis and patient has failed non-surgical conservative treatments for greater than 12 weeks to include NSAID's and/or analgesics, flexibility and strengthening excercises, supervised PT with diminished ADL's post treatment, use of assistive devices, weight reduction as appropriate and activity modification.  Onset of symptoms was gradual starting 5 years ago with gradually worsening course since that time.The patient noted no past surgery on the left hip(s).  Patient currently rates pain in the left hip at 10 out of 10 with activity. Patient has night pain, worsening of pain with activity and weight bearing, trendelenberg gait, pain that interfers with activities of daily living and crepitus. Patient has evidence of subchondral cysts, subchondral sclerosis, periarticular osteophytes and joint space narrowing by imaging studies. This condition presents safety issues increasing the risk of falls.  There is no current active infection.  Patient Active Problem List   Diagnosis Date Noted  . Preop cardiovascular exam 03/30/2018  . Abnormal echocardiogram 03/30/2018  . Obese abdomen 07/22/2016  . Retrognathia 11/03/2015  . Morbid obesity ([place]) 11/03/2015  . Cardiac hypertrophy 11/03/2015  . OSA on CPAP 11/03/2015  . Degenerative joint disease (DJD) of hip 03/12/2014  . OBESITY, NOS 10/13/2006  . HYPERTENSION, BENIGN SYSTEMIC 10/13/2006  . CHF, EJECTION FRACTION > OR = 50% 10/13/2006  . CARDIOMEGALY 10/13/2006  . HEMORRHOIDS, NOS 10/13/2006  . GASTROESOPHAGEAL REFLUX, NO ESOPHAGITIS 10/13/2006  . 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 APNEA, SLEEP 10/13/2006   Past Medical History:  Diagnosis Date  . Arthritis   . Cardiomegaly 10/13/2006   Qualifier: Diagnosis  of  By: Eusebio Friendly    . CHF, EJECTION FRACTION > OR = 50% 10/13/2006   Qualifier: Diagnosis of  By: Eusebio Friendly    . Degenerative joint disease (DJD) of hip 03/12/2014  . GASTROESOPHAGEAL REFLUX, NO ESOPHAGITIS 10/13/2006   patient denies  . HEMORRHOIDS, NOS 10/13/2006   Qualifier: Diagnosis of  By: Eusebio Friendly    . History of colon polyps   . Hypertension    takes Amlodipine and Prinizide daily  . 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 Morbid obesity ([place]) 11/03/2015  . OSA on CPAP 11/03/2015  . Retrognathia 11/03/2015    Past Surgical History:  Procedure Laterality Date  . COLONOSCOPY    . KNEE ARTHROSCOPY Left 15+yrs ago  . TOTAL HIP ARTHROPLASTY Right 03/12/2014   Procedure: TOTAL HIP ARTHROPLASTY ANTERIOR APPROACH;  Surgeon: Hessie Dibble, MD;  Location: [facility];  Service: Orthopedics;  Laterality: Right;    Current Facility-Administered Medications  Medication Dose Route Frequency Provider Last Rate Last Dose  . [START ON 04/11/2018] bupivacaine liposome (EXPAREL) 1.3 % injection 133 mg  10 mL Infiltration To OR Melrose Nakayama, MD      . Derrill Memo ON 04/11/2018] ceFAZolin (ANCEF) IVPB 2g/100 mL premix  2 g Intravenous To Anson Fret, MD      . Derrill Memo ON 04/11/2018] lactated ringers infusion   Intravenous Continuous Melrose Nakayama, MD      . Derrill Memo ON 04/11/2018] tranexamic acid (CYKLOKAPRON) 1,000 mg in sodium chloride 0.9 % 100 mL IVPB  1,000 mg Intravenous To OR Melrose Nakayama, MD      . Derrill Memo ON 04/11/2018] tranexamic acid (CYKLOKAPRON) 2,000 mg  in sodium chloride 0.9 % 50 mL Topical Application  8,527 mg Topical To OR Melrose Nakayama, MD       Current Outpatient Medications  Medication Sig Dispense Refill Last Dose  . aspirin EC 81 MG tablet Take 81 mg by mouth daily.   Taking  . Cholecalciferol (VITAMIN D-3) 1000 UNITS CAPS Take 1 capsule by mouth daily.   Taking  . hydrochlorothiazide (MICROZIDE) 12.5 MG  capsule Take 12.5 mg by mouth daily.   Taking  . lisinopril (PRINIVIL,ZESTRIL) 10 MG tablet Take 10 mg by mouth daily.   Taking   No Known Allergies  Social History   Tobacco Use  . Smoking status: Former Smoker    Packs/day: 0.25    Years: 10.00    Pack years: 2.50  . Smokeless tobacco: Never Used  . Tobacco comment: 40 + years  Substance Use Topics  . Alcohol use: Never    Frequency: Never    Comment: rarely    Family History  Problem Relation Age of Onset  . Hypertension Mother   . Hyperlipidemia Mother   . Alzheimer's disease Mother   . Heart disease Father   . Hyperlipidemia Father   . Hypertension Father   . Hypertension Sister   . Hypertension Brother   . Heart attack Maternal Grandmother      Review of Systems  Musculoskeletal: Positive for joint pain.       Left hip  All other systems reviewed and are negative.   Objective:  Physical Exam  Constitutional: He is oriented to person, place, and time. He appears well-developed and well-nourished.  HENT:  Head: Normocephalic and atraumatic.  Eyes: Pupils are equal, round, and reactive to light.  Neck: Normal range of motion.  Cardiovascular: Normal rate and regular rhythm.  Respiratory: Effort normal.  GI: Soft.  Musculoskeletal:  Left hip motion is limited and very painful especially in internal rotation.  This causes the pain he experiences in his leg to the knee.  Opposite hip moves well and is painless.  His leg lengths are roughly equal.  Sensation and motor function are intact distally with palpable pulses in his feet.    Neurological: He is alert and oriented to person, place, and time.  Skin: Skin is warm and dry.  Psychiatric: He has a normal mood and affect. His behavior is normal. Judgment and thought content normal.    Vital signs in last 24 hours:    Labs:   Estimated body mass index is 38.39 kg/m as calculated from the following:   Height as of 03/31/18: 5\' 8"  (1.727 m).   Weight as of  03/31/18: 114.5 kg.   Imaging Review Plain radiographs demonstrate severe degenerative joint disease of the left hip(s). The bone quality appears to be good for age and reported activity level.    Preoperative templating of the joint replacement has been completed, documented, and submitted to the Operating Room personnel in order to optimize intra-operative equipment management.     Assessment/Plan:  End stage primary arthritis, left hip(s)  The patient history, physical examination, clinical judgement of the provider and imaging studies are consistent with end stage degenerative joint disease of the left hip(s) and total hip arthroplasty is deemed medically necessary. The treatment options including medical management, injection therapy, arthroscopy and arthroplasty were discussed at length. The risks and benefits of total hip arthroplasty were presented and reviewed. The risks due to aseptic loosening, infection, stiffness, dislocation/subluxation,  thromboembolic complications and other imponderables were discussed.  The patient acknowledged the explanation, agreed to proceed with the plan and consent was signed. Patient is being admitted for inpatient treatment for surgery, pain control, PT, OT, prophylactic antibiotics, VTE prophylaxis, progressive ambulation and ADL's and discharge planning.The patient is planning to be discharged home with home health services

## 2018-04-11 ENCOUNTER — Inpatient Hospital Stay (HOSPITAL_COMMUNITY): Payer: Medicare HMO | Admitting: Certified Registered"

## 2018-04-11 ENCOUNTER — Encounter (HOSPITAL_COMMUNITY): Payer: Self-pay | Admitting: Certified Registered"

## 2018-04-11 ENCOUNTER — Encounter (HOSPITAL_COMMUNITY)
Admission: RE | Disposition: A | Discharge status: Home Health | Payer: Self-pay | Source: Ambulatory Visit | Attending: Orthopaedic Surgery

## 2018-04-11 ENCOUNTER — Inpatient Hospital Stay (HOSPITAL_COMMUNITY)
Admission: RE | Admit: 2018-04-11 | Discharge: 2018-04-13 | DRG: 470 | Disposition: A | Discharge status: Home Health | Payer: Medicare HMO | Source: Ambulatory Visit | Attending: Orthopaedic Surgery | Admitting: Orthopaedic Surgery

## 2018-04-11 ENCOUNTER — Inpatient Hospital Stay (HOSPITAL_COMMUNITY): Payer: Medicare HMO

## 2018-04-11 ENCOUNTER — Inpatient Hospital Stay (HOSPITAL_COMMUNITY): Payer: Medicare HMO | Admitting: Vascular Surgery

## 2018-04-11 ENCOUNTER — Other Ambulatory Visit: Payer: Self-pay

## 2018-04-11 DIAGNOSIS — Z96642 Presence of left artificial hip joint: Secondary | ICD-10-CM | POA: Diagnosis not present

## 2018-04-11 DIAGNOSIS — Z419 Encounter for procedure for purposes other than remedying health state, unspecified: Secondary | ICD-10-CM

## 2018-04-11 DIAGNOSIS — E669 Obesity, unspecified: Secondary | ICD-10-CM | POA: Diagnosis not present

## 2018-04-11 DIAGNOSIS — G4733 Obstructive sleep apnea (adult) (pediatric): Secondary | ICD-10-CM | POA: Diagnosis not present

## 2018-04-11 DIAGNOSIS — K219 Gastro-esophageal reflux disease without esophagitis: Secondary | ICD-10-CM | POA: Diagnosis not present

## 2018-04-11 DIAGNOSIS — K649 Unspecified hemorrhoids: Secondary | ICD-10-CM | POA: Diagnosis not present

## 2018-04-11 DIAGNOSIS — Z6838 Body mass index (BMI) 38.0-38.9, adult: Secondary | ICD-10-CM | POA: Diagnosis not present

## 2018-04-11 DIAGNOSIS — Z7982 Long term (current) use of aspirin: Secondary | ICD-10-CM

## 2018-04-11 DIAGNOSIS — Z8349 Family history of other endocrine, nutritional and metabolic diseases: Secondary | ICD-10-CM | POA: Diagnosis not present

## 2018-04-11 DIAGNOSIS — Z79899 Other long term (current) drug therapy: Secondary | ICD-10-CM | POA: Diagnosis not present

## 2018-04-11 DIAGNOSIS — Z86010 Personal history of colonic polyps: Secondary | ICD-10-CM | POA: Diagnosis not present

## 2018-04-11 DIAGNOSIS — Z23 Encounter for immunization: Secondary | ICD-10-CM

## 2018-04-11 DIAGNOSIS — Z87891 Personal history of nicotine dependence: Secondary | ICD-10-CM

## 2018-04-11 DIAGNOSIS — I10 Essential (primary) hypertension: Secondary | ICD-10-CM | POA: Diagnosis present

## 2018-04-11 DIAGNOSIS — M1612 Unilateral primary osteoarthritis, left hip: Principal | ICD-10-CM | POA: Diagnosis present

## 2018-04-11 DIAGNOSIS — Z8249 Family history of ischemic heart disease and other diseases of the circulatory system: Secondary | ICD-10-CM

## 2018-04-11 HISTORY — DX: Unilateral primary osteoarthritis, left hip: M16.12

## 2018-04-11 HISTORY — PX: TOTAL HIP ARTHROPLASTY: SHX124

## 2018-04-11 IMAGING — RF DG C-ARM 61-120 MIN
1 series · 2 of 2 positions shown · non-contrast
Comparison: None.

CLINICAL DATA: Left total hip arthroplasty

EXAM:
OPERATIVE left HIP (WITH PELVIS IF PERFORMED) 2 VIEWS
TECHNIQUE: Fluoroscopic spot image(s) were submitted for interpretation
post-operatively.

[Series 1: run · 2 of 2 slices shown]
[im 1/2]
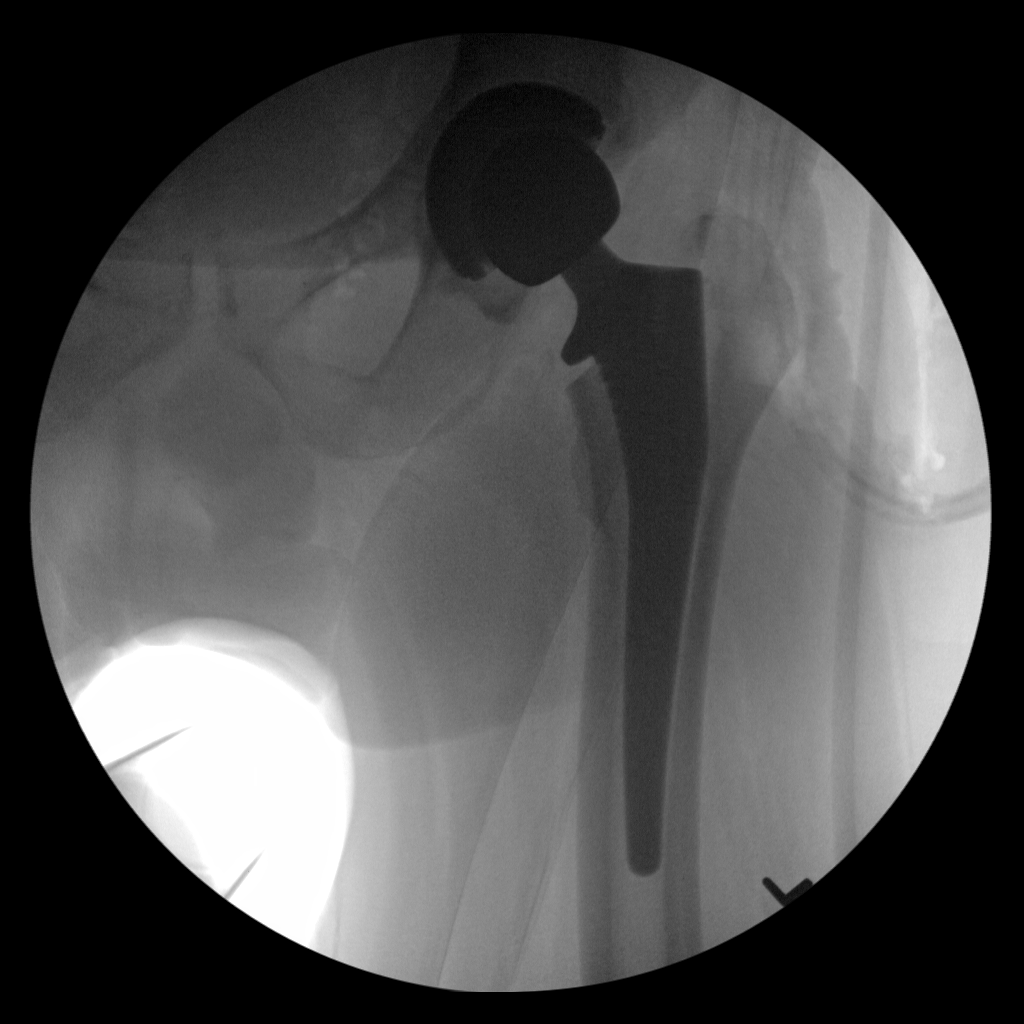
[im 2/2]
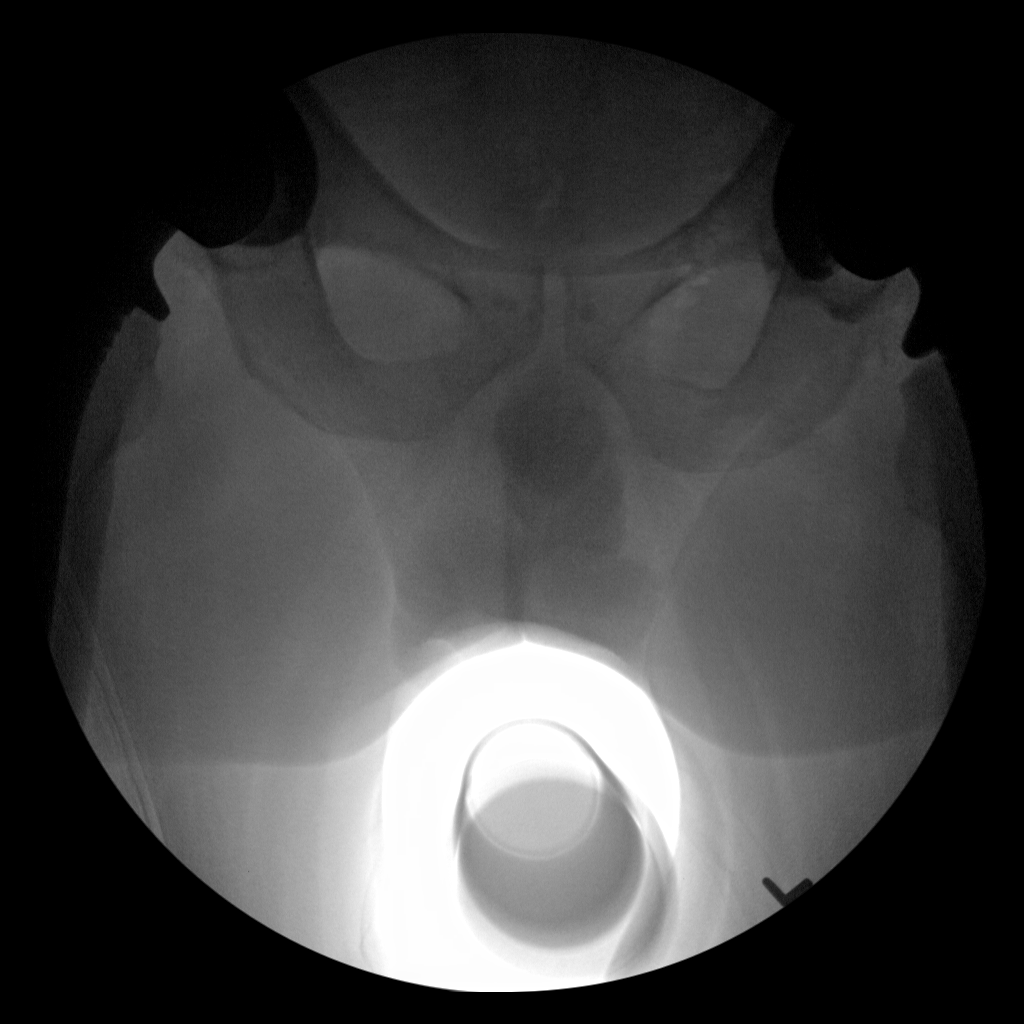

[2 of 2 positions shown; findings below may reference images not displayed]

FINDINGS: Fluoroscopic images were obtained during left total hip
arthroplasty. No immediate abnormality is evident.
IMPRESSION: Intraoperative fluoroscopy.

## 2018-04-11 SURGERY — ARTHROPLASTY, HIP, TOTAL, ANTERIOR APPROACH
Anesthesia: Monitor Anesthesia Care | Site: Hip | Laterality: Left

## 2018-04-11 MED ORDER — DOCUSATE SODIUM 100 MG PO CAPS
100.0000 mg | ORAL_CAPSULE | Freq: Two times a day (BID) | ORAL | Status: DC
  Administered 2018-04-11 – 2018-04-13 (×4): 100 mg via ORAL
  Filled 2018-04-11 (×4): qty 1

## 2018-04-11 MED ORDER — HYDROCHLOROTHIAZIDE 12.5 MG PO CAPS
12.5000 mg | ORAL_CAPSULE | Freq: Every day | ORAL | Status: DC
  Administered 2018-04-12 – 2018-04-13 (×2): 12.5 mg via ORAL
  Filled 2018-04-11 (×2): qty 1

## 2018-04-11 MED ORDER — PROPOFOL 500 MG/50ML IV EMUL
INTRAVENOUS | Status: DC | PRN
  Administered 2018-04-11: 100 ug/kg/min via INTRAVENOUS

## 2018-04-11 MED ORDER — MENTHOL 3 MG MT LOZG: 1.0000 | LOZENGE | OROMUCOSAL | Status: DC | PRN

## 2018-04-11 MED ORDER — METHOCARBAMOL 500 MG PO TABS
500.0000 mg | ORAL_TABLET | Freq: Four times a day (QID) | ORAL | Status: DC | PRN
  Administered 2018-04-11 (×2): 500 mg via ORAL
  Filled 2018-04-11 (×3): qty 1

## 2018-04-11 MED ORDER — PHENOL 1.4 % MT LIQD: 1.0000 | OROMUCOSAL | Status: DC | PRN

## 2018-04-11 MED ORDER — ONDANSETRON HCL 4 MG/2ML IJ SOLN
4.0000 mg | Freq: Four times a day (QID) | INTRAMUSCULAR | Status: DC | PRN
  Administered 2018-04-11: 4 mg via INTRAVENOUS
  Filled 2018-04-11: qty 2

## 2018-04-11 MED ORDER — BUPIVACAINE-EPINEPHRINE (PF) 0.25% -1:200000 IJ SOLN
INTRAMUSCULAR | Status: AC
  Filled 2018-04-11: qty 30

## 2018-04-11 MED ORDER — ACETAMINOPHEN 500 MG PO TABS
500.0000 mg | ORAL_TABLET | Freq: Four times a day (QID) | ORAL | Status: AC
  Administered 2018-04-12 (×3): 500 mg via ORAL
  Filled 2018-04-11 (×4): qty 1

## 2018-04-11 MED ORDER — HYDROCODONE-ACETAMINOPHEN 5-325 MG PO TABS: 1.0000 | ORAL_TABLET | ORAL | Status: DC | PRN

## 2018-04-11 MED ORDER — LISINOPRIL 10 MG PO TABS
10.0000 mg | ORAL_TABLET | Freq: Every day | ORAL | Status: DC
  Administered 2018-04-13: 10 mg via ORAL
  Filled 2018-04-11 (×2): qty 1

## 2018-04-11 MED ORDER — LACTATED RINGERS IV SOLN
INTRAVENOUS | Status: DC
  Administered 2018-04-11: 17:00:00 via INTRAVENOUS

## 2018-04-11 MED ORDER — ASPIRIN EC 325 MG PO TBEC
325.0000 mg | DELAYED_RELEASE_TABLET | Freq: Two times a day (BID) | ORAL | Status: DC
  Administered 2018-04-12 – 2018-04-13 (×3): 325 mg via ORAL
  Filled 2018-04-11 (×3): qty 1

## 2018-04-11 MED ORDER — FENTANYL CITRATE (PF) 250 MCG/5ML IJ SOLN
INTRAMUSCULAR | Status: DC | PRN
  Administered 2018-04-11: 50 ug via INTRAVENOUS

## 2018-04-11 MED ORDER — SODIUM CHLORIDE 0.9 % IV SOLN
INTRAVENOUS | Status: DC | PRN
  Administered 2018-04-11: 25 ug/min via INTRAVENOUS

## 2018-04-11 MED ORDER — CHLORHEXIDINE GLUCONATE 4 % EX LIQD: 60.0000 mL | Freq: Once | CUTANEOUS | Status: DC

## 2018-04-11 MED ORDER — OXYCODONE HCL 5 MG/5ML PO SOLN: 5.0000 mg | Freq: Once | ORAL | Status: DC | PRN

## 2018-04-11 MED ORDER — FENTANYL CITRATE (PF) 100 MCG/2ML IJ SOLN: 25.0000 ug | INTRAMUSCULAR | Status: DC | PRN

## 2018-04-11 MED ORDER — DIPHENHYDRAMINE HCL 12.5 MG/5ML PO ELIX: 12.5000 mg | ORAL_SOLUTION | ORAL | Status: DC | PRN

## 2018-04-11 MED ORDER — ACETAMINOPHEN 325 MG PO TABS: 325.0000 mg | ORAL_TABLET | Freq: Four times a day (QID) | ORAL | Status: DC | PRN

## 2018-04-11 MED ORDER — METHOCARBAMOL 1000 MG/10ML IJ SOLN: 500.0000 mg | Freq: Four times a day (QID) | INTRAVENOUS | Status: DC | PRN

## 2018-04-11 MED ORDER — BISACODYL 5 MG PO TBEC: 5.0000 mg | DELAYED_RELEASE_TABLET | Freq: Every day | ORAL | Status: DC | PRN

## 2018-04-11 MED ORDER — METOCLOPRAMIDE HCL 5 MG/ML IJ SOLN: 5.0000 mg | Freq: Three times a day (TID) | INTRAMUSCULAR | Status: DC | PRN

## 2018-04-11 MED ORDER — PROPOFOL 10 MG/ML IV BOLUS
INTRAVENOUS | Status: DC | PRN
  Administered 2018-04-11: 20 mg via INTRAVENOUS

## 2018-04-11 MED ORDER — PHENYLEPHRINE 40 MCG/ML (10ML) SYRINGE FOR IV PUSH (FOR BLOOD PRESSURE SUPPORT)
PREFILLED_SYRINGE | INTRAVENOUS | Status: DC | PRN
  Administered 2018-04-11: 120 ug via INTRAVENOUS
  Administered 2018-04-11: 200 ug via INTRAVENOUS

## 2018-04-11 MED ORDER — TRANEXAMIC ACID 1000 MG/10ML IV SOLN
1000.0000 mg | Freq: Once | INTRAVENOUS | Status: AC
  Administered 2018-04-11: 1000 mg via INTRAVENOUS
  Filled 2018-04-11: qty 10

## 2018-04-11 MED ORDER — OXYCODONE HCL 5 MG PO TABS: 5.0000 mg | ORAL_TABLET | Freq: Once | ORAL | Status: DC | PRN

## 2018-04-11 MED ORDER — METOCLOPRAMIDE HCL 5 MG PO TABS: 5.0000 mg | ORAL_TABLET | Freq: Three times a day (TID) | ORAL | Status: DC | PRN

## 2018-04-11 MED ORDER — BUPIVACAINE IN DEXTROSE 0.75-8.25 % IT SOLN
INTRATHECAL | Status: DC | PRN
  Administered 2018-04-11: 1.8 mL via INTRATHECAL

## 2018-04-11 MED ORDER — ALBUMIN HUMAN 5 % IV SOLN
INTRAVENOUS | Status: DC | PRN
  Administered 2018-04-11 (×3): via INTRAVENOUS

## 2018-04-11 MED ORDER — ONDANSETRON HCL 4 MG PO TABS: 4.0000 mg | ORAL_TABLET | Freq: Four times a day (QID) | ORAL | Status: DC | PRN

## 2018-04-11 MED ORDER — HYDROCODONE-ACETAMINOPHEN 7.5-325 MG PO TABS
1.0000 | ORAL_TABLET | ORAL | Status: DC | PRN
  Administered 2018-04-11 – 2018-04-12 (×4): 2 via ORAL
  Filled 2018-04-11 (×4): qty 2

## 2018-04-11 MED ORDER — ONDANSETRON HCL 4 MG/2ML IJ SOLN: 4.0000 mg | Freq: Four times a day (QID) | INTRAMUSCULAR | Status: DC | PRN

## 2018-04-11 MED ORDER — ONDANSETRON HCL 4 MG/2ML IJ SOLN
INTRAMUSCULAR | Status: DC | PRN
  Administered 2018-04-11: 4 mg via INTRAVENOUS

## 2018-04-11 MED ORDER — MORPHINE SULFATE (PF) 2 MG/ML IV SOLN
0.5000 mg | INTRAVENOUS | Status: DC | PRN
  Administered 2018-04-11: 1 mg via INTRAVENOUS
  Filled 2018-04-11: qty 1

## 2018-04-11 MED ORDER — FENTANYL CITRATE (PF) 250 MCG/5ML IJ SOLN
INTRAMUSCULAR | Status: AC
  Filled 2018-04-11: qty 5

## 2018-04-11 MED ORDER — ALUM & MAG HYDROXIDE-SIMETH 200-200-20 MG/5ML PO SUSP: 30.0000 mL | ORAL | Status: DC | PRN

## 2018-04-11 MED ORDER — PROPOFOL 10 MG/ML IV BOLUS
INTRAVENOUS | Status: AC
  Filled 2018-04-11: qty 20

## 2018-04-11 MED ORDER — CEFAZOLIN SODIUM-DEXTROSE 2-4 GM/100ML-% IV SOLN
2.0000 g | Freq: Four times a day (QID) | INTRAVENOUS | Status: AC
  Administered 2018-04-11 – 2018-04-12 (×2): 2 g via INTRAVENOUS
  Filled 2018-04-11 (×2): qty 100

## 2018-04-11 MED ORDER — KETOROLAC TROMETHAMINE 15 MG/ML IJ SOLN
7.5000 mg | Freq: Four times a day (QID) | INTRAMUSCULAR | Status: AC
  Administered 2018-04-11 – 2018-04-12 (×4): 7.5 mg via INTRAVENOUS
  Filled 2018-04-11 (×4): qty 1

## 2018-04-11 MED ORDER — PNEUMOCOCCAL VAC POLYVALENT 25 MCG/0.5ML IJ INJ
0.5000 mL | INJECTION | INTRAMUSCULAR | Status: AC
  Administered 2018-04-13: 0.5 mL via INTRAMUSCULAR
  Filled 2018-04-11: qty 0.5

## 2018-04-11 MED ORDER — ONDANSETRON HCL 4 MG/2ML IJ SOLN
INTRAMUSCULAR | Status: AC
  Filled 2018-04-11: qty 2

## 2018-04-11 SURGICAL SUPPLY — 48 items
BLADE SAW SGTL 18X1.27X75 (BLADE) ×2 IMPLANT
CELLS DAT CNTRL 66122 CELL SVR (MISCELLANEOUS) ×1 IMPLANT
COVER PERINEAL POST (MISCELLANEOUS) ×2 IMPLANT
COVER SURGICAL LIGHT HANDLE (MISCELLANEOUS) ×2 IMPLANT
CUP ACETABULAR GRIPTON 100 52 (Orthopedic Implant) IMPLANT
DRAPE C-ARM 42X72 X-RAY (DRAPES) ×2 IMPLANT
DRAPE STERI IOBAN 125X83 (DRAPES) ×2 IMPLANT
DRAPE U-SHAPE 47X51 STRL (DRAPES) ×6 IMPLANT
DRSG AQUACEL AG ADV 3.5X10 (GAUZE/BANDAGES/DRESSINGS) ×2 IMPLANT
DRSG OPSITE POSTOP 4X6 (GAUZE/BANDAGES/DRESSINGS) ×2 IMPLANT
DURAPREP 26ML APPLICATOR (WOUND CARE) ×2 IMPLANT
ELECT BLADE 4.0 EZ CLEAN MEGAD (MISCELLANEOUS) ×2
ELECT REM PT RETURN 9FT ADLT (ELECTROSURGICAL) ×2
ELECTRODE BLDE 4.0 EZ CLN MEGD (MISCELLANEOUS) ×1 IMPLANT
ELECTRODE REM PT RTRN 9FT ADLT (ELECTROSURGICAL) ×1 IMPLANT
ELIMINATOR HOLE APEX DEPUY (Hips) ×1 IMPLANT
FACESHIELD WRAPAROUND (MASK) ×4 IMPLANT
FACESHIELD WRAPAROUND OR TEAM (MASK) ×2 IMPLANT
GLOVE BIO SURGEON STRL SZ8 (GLOVE) ×4 IMPLANT
GLOVE BIOGEL PI IND STRL 8 (GLOVE) ×2 IMPLANT
GLOVE BIOGEL PI INDICATOR 8 (GLOVE) ×2
GOWN STRL REUS W/ TWL LRG LVL3 (GOWN DISPOSABLE) ×1 IMPLANT
GOWN STRL REUS W/ TWL XL LVL3 (GOWN DISPOSABLE) ×2 IMPLANT
GOWN STRL REUS W/TWL LRG LVL3 (GOWN DISPOSABLE) ×2
GOWN STRL REUS W/TWL XL LVL3 (GOWN DISPOSABLE) ×4
GRIPTON 100 52 (Orthopedic Implant) ×2 IMPLANT
HEAD M SROM 36MM 2 (Hips) IMPLANT
KIT BASIN OR (CUSTOM PROCEDURE TRAY) ×2 IMPLANT
KIT TURNOVER KIT B (KITS) ×2 IMPLANT
LINER NEUTRAL 52X36MM PLUS 4 (Liner) ×1 IMPLANT
MANIFOLD NEPTUNE II (INSTRUMENTS) ×2 IMPLANT
NEEDLE HYPO 22GX1.5 SAFETY (NEEDLE) ×2 IMPLANT
NS IRRIG 1000ML POUR BTL (IV SOLUTION) ×2 IMPLANT
PACK TOTAL JOINT (CUSTOM PROCEDURE TRAY) ×2 IMPLANT
PAD ARMBOARD 7.5X6 YLW CONV (MISCELLANEOUS) ×2 IMPLANT
RETRACTOR WND ALEXIS 18 MED (MISCELLANEOUS) ×1 IMPLANT
RTRCTR WOUND ALEXIS 18CM MED (MISCELLANEOUS) ×2
SROM M HEAD 36MM 2 (Hips) ×2 IMPLANT
STEM CORAIL KA12 (Stem) ×1 IMPLANT
SUT ETHIBOND NAB CT1 #1 30IN (SUTURE) ×4 IMPLANT
SUT VIC AB 1 CT1 27 (SUTURE) ×2
SUT VIC AB 1 CT1 27XBRD ANBCTR (SUTURE) ×1 IMPLANT
SUT VIC AB 2-0 CT1 27 (SUTURE) ×2
SUT VIC AB 2-0 CT1 TAPERPNT 27 (SUTURE) ×1 IMPLANT
SUT VIC AB 3-0 PS2 18 (SUTURE) ×2
SUT VIC AB 3-0 PS2 18XBRD (SUTURE) ×1 IMPLANT
SUT VLOC 180 0 24IN GS25 (SUTURE) ×2 IMPLANT
SYR 50ML LL SCALE MARK (SYRINGE) ×2 IMPLANT

## 2018-04-11 NOTE — Anesthesia Procedure Notes (Signed)
Spinal  Patient location during procedure: OR Start time: 04/11/2018 12:25 PM End time: 04/11/2018 12:28 PM Staffing Anesthesiologist: Albertha Ghee, MD Performed: anesthesiologist  Preanesthetic Checklist Completed: patient identified, surgical consent, pre-op evaluation, timeout performed, IV checked, risks and benefits discussed and monitors and equipment checked Spinal Block Patient position: sitting Prep: DuraPrep Patient monitoring: cardiac monitor, continuous pulse ox and blood pressure Approach: midline Location: L3-4 Injection technique: single-shot Needle Needle type: Pencan  Needle gauge: 24 G Needle length: 9 cm Assessment Sensory level: T10 Additional Notes Functioning IV was confirmed and monitors were applied. Sterile prep and drape, including hand hygiene and sterile gloves were used. The patient was positioned and the spine was prepped. The skin was anesthetized with lidocaine.  Free flow of clear CSF was obtained prior to injecting local anesthetic into the CSF.  The spinal needle aspirated freely following injection.  The needle was carefully withdrawn.  The patient tolerated the procedure well.

## 2018-04-11 NOTE — Anesthesia Procedure Notes (Signed)
Procedure Name: MAC Date/Time: 04/11/2018 12:21 PM Performed by: Barrington Ellison, CRNA Pre-anesthesia Checklist: Patient identified, Emergency Drugs available, Suction available, Patient being monitored and Timeout performed Patient Re-evaluated:Patient Re-evaluated prior to induction Oxygen Delivery Method: Simple face mask

## 2018-04-11 NOTE — Progress Notes (Signed)
PT Cancellation Note  Patient Details Name: ELIOR ROBINETTE MRN: [id number] DOB: 18-Sep-1940   Cancelled Treatment:    Reason Eval/Treat Not Completed: Pain limiting ability to participate Pt with increased pain, and requesting for PT to come back tomorrow. Will reattempt as schedule allows.   Leighton Ruff, PT, DPT  Acute Rehabilitation Services  Pager: [phone]    Rudean Hitt 04/11/2018, 6:29 PM

## 2018-04-11 NOTE — Transfer of Care (Signed)
Immediate Anesthesia Transfer of Care Note  Patient: Kevin Weiss  Procedure(s) Performed: TOTAL HIP ARTHROPLASTY ANTERIOR APPROACH (Left Hip)  Patient Location: PACU  Anesthesia Type:MAC and Spinal  Level of Consciousness: awake, alert  and oriented  Airway & Oxygen Therapy: Patient Spontanous Breathing  Post-op Assessment: Report given to RN  Post vital signs: Reviewed and stable  Last Vitals:  Vitals Value Taken Time  BP 90/57 04/11/2018  2:57 PM  Temp    Pulse 58 04/11/2018  2:57 PM  Resp 22 04/11/2018  2:59 PM  SpO2 90 % 04/11/2018  2:57 PM  Vitals shown include unvalidated device data.  Last Pain:  Vitals:   04/11/18 1141  TempSrc:   PainSc: 0-No pain         Complications: No apparent anesthesia complications

## 2018-04-11 NOTE — Op Note (Signed)
PRE-OP DIAGNOSIS:  LEFT HIP DEGENERATIVE JOINT DISEASE POST-OP DIAGNOSIS: same PROCEDURE:  LEFT TOTAL HIP ARTHROPLASTY ANTERIOR APPROACH ANESTHESIA:  Spinal and MAC SURGEON:  Melrose Nakayama MD ASSISTANT:  Loni Dolly PA-C   INDICATIONS FOR PROCEDURE:  The patient is a 77 y.o. male with a long history of a painful hip.  This has persisted despite multiple conservative measures.  The patient has persisted with pain and dysfunction making rest and activity difficult.  A total hip replacement is offered as surgical treatment.  Informed operative consent was obtained after discussion of possible complications including reaction to anesthesia, infection, neurovascular injury, dislocation, DVT, PE, and death.  The importance of the postoperative rehab program to optimize result was stressed with the patient.  SUMMARY OF FINDINGS AND PROCEDURE:  Under the above anesthesia through a anterior approach an the Hana table a left THR was performed.  The patient had severe degenerative change and excellent bone quality.  We used DePuy components to replace the hip and these were size KA12 [place] femur capped with a -2 36 mm metal hip ball.  On the acetabular side we used a size 52 Gription shell with a plus 4 neutral polyethylene liner.  We did use a hole eliminator.  Loni Dolly PA-C assisted throughout and was invaluable to the completion of the case in that he helped position and retract while I performed the procedure.  He also closed simultaneously to help minimize OR time.  I used fluoroscopy throughout the case to check position of components and leg lengths and read all these views myself. This was an extremely difficult case due to the size and musculature of the patient.   DESCRIPTION OF PROCEDURE:  The patient was taken to the OR suite where the above anesthetic was applied.  The patient was then positioned on the Hana table supine.  All bony prominences were appropriately padded.  Prep and drape was then  performed in normal sterile fashion.  The patient was given kefzol preoperative antibiotic and an appropriate time out was performed.  We then took an anterior approach to the left hip.  Dissection was taken through adipose to the tensor fascia lata fascia.  This structure was incised longitudinally and we dissected in the intermuscular interval just medial to this muscle.  Cobra retractors were placed superior and inferior to the femoral neck superficial to the capsule.  A capsular incision was then made and the retractors were placed along the femoral neck.  Xray was brought in to get a good level for the femoral neck cut which was made with an oscillating saw and osteotome.  The femoral head was removed with a corkscrew.  The acetabulum was exposed and some labral tissues were excised. Reaming was taken to the inside wall of the pelvis and sequentially up to 1 mm smaller than the actual component.  A trial of components was done and then the aforementioned acetabular shell was placed in appropriate tilt and anteversion confirmed by fluoroscopy. The liner was placed along with the hole eliminator and attention was turned to the femur.  The leg was brought down and over into adduction and the elevator bar was used to raise the femur up gently in the wound.  The piriformis was released with care taken to preserve the obturator internus attachment and all of the posterior capsule. The femur was reamed and then broached to the appropriate size.  A trial reduction was done and the aforementioned head and neck assembly gave [place] the best  stability in extension with external rotation.  Leg lengths were felt to be about equal by fluoroscopic exam.  The trial components were removed and the wound irrigated.  We then placed the femoral component in appropriate anteversion.  The head was applied to a dry stem neck and the hip again reduced.  It was again stable in the aforementioned position.  The would was irrigated again  followed by re-approximation of anterior capsule with ethibond suture. Tensor fascia was repaired with V-loc suture  followed by deep closure with #O and #2 undyed vicryl.  Skin was closed with subQ stitch and steristrips followed by a sterile dressing.  EBL and IOF can be obtained from anesthesia records.  DISPOSITION:  The patient was extubated in the OR and taken to PACU in stable condition to be admitted to the Orthopedic Surgery for appropriate post-op care to include perioperative antibiotics and DVT prophylaxis.

## 2018-04-11 NOTE — Progress Notes (Signed)
Placed patient on CPAP via nasal pillows (home mask/tubing) 5.0 cm H20 per patient. Tolerating well at this time. RN aware.

## 2018-04-11 NOTE — Interval H&P Note (Signed)
History and Physical Interval Note:  04/11/2018 11:33 AM  Kevin Weiss  has presented today for surgery, with the diagnosis of LEFT HIP DEGENERATIVE JOINT DISEASE  The various methods of treatment have been discussed with the patient and family. After consideration of risks, benefits and other options for treatment, the patient has consented to  Procedure(s): TOTAL HIP ARTHROPLASTY ANTERIOR APPROACH (Left) as a surgical intervention .  The patient's history has been reviewed, patient examined, no change in status, stable for surgery.  I have reviewed the patient's chart and labs.  Questions were answered to the patient's satisfaction.     Carson Meche G

## 2018-04-11 NOTE — Anesthesia Preprocedure Evaluation (Signed)
Anesthesia Evaluation  Patient identified by MRN, date of birth, ID band Patient awake    Reviewed: Allergy & Precautions, H&P , NPO status , Patient's Chart, lab work & pertinent test results  Airway Mallampati: II   Neck ROM: full    Dental   Pulmonary sleep apnea , former smoker,    breath sounds clear to auscultation       Cardiovascular hypertension,  Rhythm:regular Rate:Normal     Neuro/Psych    GI/Hepatic GERD  ,  Endo/Other  obese  Renal/GU      Musculoskeletal  (+) Arthritis ,   Abdominal   Peds  Hematology   Anesthesia Other Findings   Reproductive/Obstetrics                             Anesthesia Physical Anesthesia Plan  ASA: III  Anesthesia Plan: MAC and Spinal   Post-op Pain Management:    Induction: Intravenous  PONV Risk Score and Plan: 1 and Ondansetron, Propofol infusion and Treatment may vary due to age or medical condition  Airway Management Planned: Simple Face Mask  Additional Equipment:   Intra-op Plan:   Post-operative Plan:   Informed Consent: I have reviewed the patients History and Physical, chart, labs and discussed the procedure including the risks, benefits and alternatives for the proposed anesthesia with the patient or authorized representative who has indicated his/her understanding and acceptance.     Plan Discussed with: CRNA, Anesthesiologist and Surgeon  Anesthesia Plan Comments:         Anesthesia Quick Evaluation

## 2018-04-11 NOTE — Anesthesia Postprocedure Evaluation (Signed)
Anesthesia Post Note  Patient: Kevin Weiss  Procedure(s) Performed: TOTAL HIP ARTHROPLASTY ANTERIOR APPROACH (Left Hip)     Patient location during evaluation: PACU Anesthesia Type: Spinal Level of consciousness: oriented and awake and alert Pain management: pain level controlled Vital Signs Assessment: post-procedure vital signs reviewed and stable Respiratory status: spontaneous breathing, respiratory function stable and patient connected to nasal cannula oxygen Cardiovascular status: blood pressure returned to baseline and stable Postop Assessment: no headache, no backache and no apparent nausea or vomiting Anesthetic complications: no    Last Vitals:  Vitals:   04/11/18 1653 04/11/18 2042  BP: 110/64 137/63  Pulse:  93  Resp:  18  Temp:  36.5 C  SpO2:  99%    Last Pain:  Vitals:   04/11/18 2047  TempSrc:   PainSc: 6                  Ryan P Ellender

## 2018-04-12 ENCOUNTER — Encounter (HOSPITAL_COMMUNITY): Payer: Self-pay | Admitting: Orthopaedic Surgery

## 2018-04-12 LAB — POCT I-STAT 4, (NA,K, GLUC, HGB,HCT)
Glucose, Bld: 101 mg/dL — ABNORMAL HIGH (ref 70–99)
Glucose, Bld: 103 mg/dL — ABNORMAL HIGH (ref 70–99)
HCT: 33 % — ABNORMAL LOW (ref 39.0–52.0)
HEMATOCRIT: 37 % — AB (ref 39.0–52.0)
HEMOGLOBIN: 12.6 g/dL — AB (ref 13.0–17.0)
Hemoglobin: 11.2 g/dL — ABNORMAL LOW (ref 13.0–17.0)
POTASSIUM: 4.1 mmol/L (ref 3.5–5.1)
Potassium: 3.7 mmol/L (ref 3.5–5.1)
SODIUM: 140 mmol/L (ref 135–145)
Sodium: 141 mmol/L (ref 135–145)

## 2018-04-12 LAB — BASIC METABOLIC PANEL
Anion gap: 10 (ref 5–15)
BUN: 18 mg/dL (ref 8–23)
CHLORIDE: 101 mmol/L (ref 98–111)
CO2: 23 mmol/L (ref 22–32)
CREATININE: 1.41 mg/dL — AB (ref 0.61–1.24)
Calcium: 8.1 mg/dL — ABNORMAL LOW (ref 8.9–10.3)
GFR calc Af Amer: 55 mL/min — ABNORMAL LOW (ref >60)
GFR calc non Af Amer: 48 mL/min — ABNORMAL LOW (ref >60)
Glucose, Bld: 138 mg/dL — ABNORMAL HIGH (ref 70–99)
Potassium: 4.3 mmol/L (ref 3.5–5.1)
SODIUM: 134 mmol/L — AB (ref 135–145)

## 2018-04-12 LAB — CBC
HEMATOCRIT: 24.7 % — AB (ref 39.0–52.0)
HEMOGLOBIN: 8.2 g/dL — AB (ref 13.0–17.0)
MCH: 28.9 pg (ref 26.0–34.0)
MCHC: 33.2 g/dL (ref 30.0–36.0)
MCV: 87 fL (ref 78.0–100.0)
Platelets: 137 10*3/uL — ABNORMAL LOW (ref 150–400)
RBC: 2.84 MIL/uL — ABNORMAL LOW (ref 4.22–5.81)
RDW: 13.5 % (ref 11.5–15.5)
WBC: 9.7 10*3/uL (ref 4.0–10.5)

## 2018-04-12 LAB — PREPARE RBC (CROSSMATCH)

## 2018-04-12 MED ORDER — SODIUM CHLORIDE 0.9% IV SOLUTION
Freq: Once | INTRAVENOUS | Status: AC
  Administered 2018-04-12: 09:00:00 via INTRAVENOUS

## 2018-04-12 NOTE — Progress Notes (Signed)
Subjective: 1 Day Post-Op Procedure(s) (LRB): TOTAL HIP ARTHROPLASTY ANTERIOR APPROACH (Left)  Patient is feeling much better today. He did have an episode or two of feeling a little light headed but feels ok now just lying down. We discussed that he lost a little more blood than we would of liked during surgery yesterday. His Hgb is 8.2 this morning so we discussed the possibility of giving him a little blood.   Activity level:  wbat Diet tolerance:  ok Voiding:  ok Patient reports pain as mild and moderate.    Objective: Vital signs in last 24 hours: Temp:  [97.5 F (36.4 C)-98.6 F (37 C)] 98 F (36.7 C) (08/28 0427) Pulse Rate:  [74-103] 81 (08/28 0427) Resp:  [16-22] 16 (08/28 0427) BP: (89-137)/(43-79) 114/62 (08/28 0427) SpO2:  [97 %-100 %] 100 % (08/28 0427) Weight:  [114.5 kg] 114.5 kg (08/27 1136)  Labs: Recent Labs    04/12/18 0358  HGB 8.2*   Recent Labs    04/12/18 0358  WBC 9.7  RBC 2.84*  HCT 24.7*  PLT 137*   Recent Labs    04/12/18 0358  NA 134*  K 4.3  CL 101  CO2 23  BUN 18  CREATININE 1.41*  GLUCOSE 138*  CALCIUM 8.1*   No results for input(s): LABPT, INR in the last 72 hours.  Physical Exam:  Neurologically intact ABD soft Neurovascular intact Sensation intact distally Intact pulses distally Dorsiflexion/Plantar flexion intact Incision: dressing C/D/I and no drainage No cellulitis present Compartment soft  Assessment/Plan:  1 Day Post-Op Procedure(s) (LRB): TOTAL HIP ARTHROPLASTY ANTERIOR APPROACH (Left) Advance diet Up with therapy Plan for discharge tomorrow Discharge home with home health if doing well and cleared by PT. We are going to give him one unit of blood this morning. Continue on ASA 325mg  BID x 4 weeks post op.  Follow up in office 2 weeks post op.   Kevin Weiss, Larwance Sachs 04/12/2018, 7:49 AM

## 2018-04-12 NOTE — Progress Notes (Signed)
Patient to self administer CPAP when ready for bed.  Patient is familiar with equipment and procedure and will be using room air and his nasal pillows from home.  Humidifier filled.

## 2018-04-12 NOTE — Progress Notes (Signed)
04/12/2018 Pt is POD #1 and is moving much better this PM, smoother gait pattern, and much further distance with RW.  Supervision overall.  We plan to practice stairs and standing exercises in AM prior to d/c.  Wells Guiles B. Lalo Tromp, PT, [place] #[phone]     04/12/18 1606  PT Visit Information  Last PT Received On 04/12/18  Assistance Needed +1  History of Present Illness 77 y.o. male admitted on 04/11/18 for elective L direct anterior THA with post op ABLA s/p 1 unit PRBCs.  Pt with significant PMH of HTN, CHF, cardiomegaly, R THA (2015), and L TKA (>15 years ago).    Subjective Data  Patient Stated Goal to get back to being able to do yard work and play golf without pain  Precautions  Precautions None  Restrictions  LLE Weight Bearing WBAT  Pain Assessment  Pain Assessment Faces  Faces Pain Scale 4  Pain Location left hip  Pain Descriptors / Indicators Aching;Burning  Pain Intervention(s) Limited activity within patient's tolerance;Monitored during session;Premedicated before session;Repositioned;Ice applied  Cognition  Arousal/Alertness Awake/alert  Behavior During Therapy WFL for tasks assessed/performed  Overall Cognitive Status Within Functional Limits for tasks assessed  Bed Mobility  General bed mobility comments Pt was OOB in the recliner chair and wants to stay out of the bed at the end of the session.   Transfers  Overall transfer level Needs assistance  Equipment used Rolling walker (2 wheeled)  Transfers Sit to/from Stand  Sit to Stand Supervision  General transfer comment Supervision for safety  Ambulation/Gait  Ambulation/Gait assistance Supervision  Gait Distance (Feet) 150 Feet  Assistive device Rolling walker (2 wheeled)  Gait Pattern/deviations Step-through pattern;Antalgic  General Gait Details Pt with mildly antalgic gait pattern, much better step through pattern this PM.   Balance  Overall balance assessment Needs assistance  Sitting-balance support Feet  supported;Bilateral upper extremity supported  Sitting balance-Leahy Scale Fair  Standing balance support Bilateral upper extremity supported  Standing balance-Leahy Scale Fair  Exercises  Exercises Total Joint  Total Joint Exercises  Ankle Circles/Pumps AROM;Both;20 reps  Quad Sets AROM;Left;10 reps  Heel Slides AAROM;Left;10 reps  Hip ABduction/ADduction AAROM;Left;10 reps  Long Arc Quad AROM;Left;10 reps  PT - End of Session  Equipment Utilized During Treatment Gait belt  Activity Tolerance Patient limited by pain  Patient left in chair;with call bell/phone within reach   PT - Assessment/Plan  PT Plan Current plan remains appropriate  PT Visit Diagnosis Muscle weakness (generalized) (M62.81);Difficulty in walking, not elsewhere classified (R26.2);Pain  Pain - Right/Left Left  Pain - part of body Hip  PT Frequency (ACUTE ONLY) 7X/week  Follow Up Recommendations Follow surgeon's recommendation for DC plan and follow-up therapies;Supervision for mobility/OOB  PT equipment None recommended by PT  AM-PAC PT "6 Clicks" Daily Activity Outcome Measure  Difficulty turning over in bed (including adjusting bedclothes, sheets and blankets)? 1  Difficulty moving from lying on back to sitting on the side of the bed?  1  Difficulty sitting down on and standing up from a chair with arms (e.g., wheelchair, bedside commode, etc,.)? 1  Help needed moving to and from a bed to chair (including a wheelchair)? 3  Help needed walking in hospital room? 3  Help needed climbing 3-5 steps with a railing?  3  6 Click Score 12  Mobility G Code  CL  PT Goal Progression  Progress towards PT goals Progressing toward goals  PT Time Calculation  PT Start Time (ACUTE ONLY) 1539  PT Stop Time (ACUTE ONLY) 1552  PT Time Calculation (min) (ACUTE ONLY) 13 min  PT General Charges  $$ ACUTE PT VISIT 1 Visit  PT Treatments  $Gait Training 8-22 mins

## 2018-04-12 NOTE — Evaluation (Signed)
Physical Therapy Evaluation Patient Details Name: Kevin Weiss MRN: [id number] DOB: 03-12-1941 Today's Date: 04/12/2018   History of Present Illness  78 y.o. male admitted on 04/11/18 for elective L direct anterior THA with post op ABLA s/p 1 unit PRBCs.  Pt with significant PMH of HTN, CHF, cardiomegaly, R THA (2015), and L TKA (>15 years ago).    Clinical Impression  Pt is POD #1 and was able to walk a good distance down the hallway with RW.  Min assist for overall mobility.  HEP reviewed and pt left up in the recliner chair.  He will likely progress well enough to d/c home with his wife's assist.  We will see him once again today and tomorrow AM before potential d/c in the PM tomorrow.   PT to follow acutely for deficits listed below.       Follow Up Recommendations Follow surgeon's recommendation for DC plan and follow-up therapies;Supervision for mobility/OOB    Equipment Recommendations  None recommended by PT    Recommendations for Other Services   NA    Precautions / Restrictions Precautions Precautions: None Restrictions LLE Weight Bearing: Weight bearing as tolerated      Mobility  Bed Mobility Overal bed mobility: Needs Assistance Bed Mobility: Supine to Sit     Supine to sit: Min assist     General bed mobility comments: Min assist to help progress left leg over EOB and assist pt to pull trunk up to sitting.  We went out of the right side of the bed as he goes this way at home.   Transfers Overall transfer level: Needs assistance Equipment used: Rolling walker (2 wheeled) Transfers: Sit to/from Stand Sit to Stand: Min assist         General transfer comment: Min assist to support trunk and stabilize RW during transitions.  Verbal cues for safe hand placement  Ambulation/Gait Ambulation/Gait assistance: Min assist Gait Distance (Feet): 85 Feet Assistive device: Rolling walker (2 wheeled) Gait Pattern/deviations: Step-to pattern;Step-through  pattern;Antalgic     General Gait Details: Moderately antalgic gait pattern starting with step to and progressing to step through sequencing.  Verbal cues for safest LE sequencing, to stay close to RW while turning, and to glide RW forward as he was picking it up each step.          Balance Overall balance assessment: Needs assistance Sitting-balance support: Feet supported;Bilateral upper extremity supported Sitting balance-Leahy Scale: Fair     Standing balance support: Bilateral upper extremity supported Standing balance-Leahy Scale: Poor                               Pertinent Vitals/Pain Pain Assessment: 0-10 Pain Score: 7  Pain Location: left hip Pain Descriptors / Indicators: Aching;Burning Pain Intervention(s): Limited activity within patient's tolerance;Monitored during session;Repositioned;Ice applied    Home Living Family/patient expects to be discharged to:: Private residence Living Arrangements: Spouse/significant other Available Help at Discharge: Family;Available 24 hours/day Type of Home: House Home Access: Stairs to enter Entrance Stairs-Rails: Right Entrance Stairs-Number of Steps: 3 Home Layout: One level Home Equipment: Walker - 2 wheels;Bedside commode      Prior Function Level of Independence: Independent         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     Cervical / Trunk Assessment Cervical / Trunk Assessment: Normal  Communication   Communication: No difficulties  Cognition Arousal/Alertness: Awake/alert Behavior During Therapy: WFL for tasks  assessed/performed Overall Cognitive Status: Within Functional Limits for tasks assessed                                           Exercises Total Joint Exercises Ankle Circles/Pumps: AROM;Both;20 reps Quad Sets: AROM;Left;10 reps Heel Slides: AAROM;Left;10 reps Hip ABduction/ADduction: AAROM;Left;10 reps   Assessment/Plan    PT Assessment Patient needs continued PT services  PT Problem List Decreased strength;Decreased range of motion;Decreased activity tolerance;Decreased balance;Decreased mobility;Decreased knowledge of use of DME;Pain       PT Treatment Interventions DME instruction;Gait training;Stair training;Functional mobility training;Therapeutic activities;Therapeutic exercise;Balance training;Patient/family education;Manual techniques;Modalities    PT Goals (Current goals can be found in the Care Plan section)  Acute Rehab PT Goals Patient Stated Goal: to get back to being able to do yard work and play golf without pain PT Goal Formulation: With patient Time For Goal Achievement: 04/19/18 Potential to Achieve Goals: Good    Frequency 7X/week           AM-PAC PT "6 Clicks" Daily Activity  Outcome Measure Difficulty turning over in bed (including adjusting bedclothes, sheets and blankets)?: Unable Difficulty moving from lying on back to sitting on the side of the bed? : Unable Difficulty sitting down on and standing up from a chair with arms (e.g., wheelchair, bedside commode, etc,.)?: Unable Help needed moving to and from a bed to chair (including a wheelchair)?: A Little Help needed walking in hospital room?: A Little Help needed climbing 3-5 steps with a railing? : A Little 6 Click Score: 12    End of Session Equipment Utilized During Treatment: Gait belt Activity Tolerance: Patient limited by pain Patient left: in chair;with call bell/phone within reach   PT Visit Diagnosis: Muscle weakness (generalized) (M62.81);Difficulty in walking,  not elsewhere classified (R26.2);Pain Pain - Right/Left: Left Pain - part of body: Hip    Time: 2778-2423 PT Time Calculation (min) (ACUTE ONLY): 29 min   Charges:       Wells Guiles B. Safia Panzer, PT, DPT [phone]     PT Evaluation $PT Eval Moderate Complexity: 1 Mod PT Treatments $Gait Training: 8-22 mins        04/12/2018, 12:54 PM

## 2018-04-12 NOTE — Plan of Care (Signed)
  Problem: Education: Goal: Knowledge of General Education information will improve Description Including pain rating scale, medication(s)/side effects and non-pharmacologic comfort measures Outcome: Progressing   

## 2018-04-13 LAB — TYPE AND SCREEN
ABO/RH(D): B POS
ANTIBODY SCREEN: NEGATIVE
Unit division: 0

## 2018-04-13 LAB — CBC
HCT: 27.1 % — ABNORMAL LOW (ref 39.0–52.0)
Hemoglobin: 9 g/dL — ABNORMAL LOW (ref 13.0–17.0)
MCH: 28.4 pg (ref 26.0–34.0)
MCHC: 33.2 g/dL (ref 30.0–36.0)
MCV: 85.5 fL (ref 78.0–100.0)
PLATELETS: 138 10*3/uL — AB (ref 150–400)
RBC: 3.17 MIL/uL — ABNORMAL LOW (ref 4.22–5.81)
RDW: 14 % (ref 11.5–15.5)
WBC: 10 10*3/uL (ref 4.0–10.5)

## 2018-04-13 LAB — BPAM RBC
Blood Product Expiration Date: 201909182359
ISSUE DATE / TIME: 201908280820
Unit Type and Rh: 7300

## 2018-04-13 MED ORDER — ASPIRIN 325 MG PO TBEC: 325.0000 mg | DELAYED_RELEASE_TABLET | Freq: Two times a day (BID) | ORAL | 0 refills | Status: DC

## 2018-04-13 MED ORDER — HYDROCODONE-ACETAMINOPHEN 5-325 MG PO TABS: 1.0000 | ORAL_TABLET | ORAL | 0 refills | Status: DC | PRN

## 2018-04-13 MED ORDER — TIZANIDINE HCL 4 MG PO TABS: 4.0000 mg | ORAL_TABLET | Freq: Four times a day (QID) | ORAL | 1 refills | Status: DC | PRN

## 2018-04-13 NOTE — Care Management Note (Signed)
Case Management Note  Patient Details  Name: Kevin Weiss MRN: [id number] Date of Birth: 1940-09-04  Subjective/Objective:  77 yr old gentleman s/p left total hip arthroplasty.                  Action/Plan: Case manager spoke with patient concerning discharge plan. Choice for Home Health agency was offered. Patient says he has used [facility] in the past and wishes to do so now. CM contacted Sherley Bounds with [facility] with referral. Patient will have support at discharge.    Expected Discharge Date:  04/13/18               Expected Discharge Plan:  [place]  In-House Referral:  NA  Discharge planning Services  CM Consult  Post Acute Care Choice:  Home Health Choice offered to:  Patient  DME Arranged:  (has RW and 3in1) DME Agency:  NA  HH Arranged:  PT HH Agency:  [facility]  Status of Service:  Completed, signed off  If discussed at [place] of Stay Meetings, dates discussed:    Additional Comments:  Ninfa Meeker, RN 04/13/2018, 11:52 AM

## 2018-04-13 NOTE — Discharge Summary (Signed)
Patient ID: Kevin Weiss MRN: [id number] DOB/AGE: 75-Jan-1942 75 y.o.  Admit date: 04/11/2018 Discharge date: 04/13/2018  Admission Diagnoses:  Principal Problem:   Primary osteoarthritis of left hip   Discharge Diagnoses:  Same  Past Medical History:  Diagnosis Date  . Arthritis   . Cardiomegaly 10/13/2006   Qualifier: Diagnosis of  By: Eusebio Friendly    . CHF, EJECTION FRACTION > OR = 50% 10/13/2006   Qualifier: Diagnosis of  By: Eusebio Friendly    . Degenerative joint disease (DJD) of hip 03/12/2014  . GASTROESOPHAGEAL REFLUX, NO ESOPHAGITIS 10/13/2006   patient denies  . HEMORRHOIDS, NOS 10/13/2006   Qualifier: Diagnosis of  By: Eusebio Friendly    . History of colon polyps   . Hypertension    takes Amlodipine and Prinizide daily  . 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 Morbid obesity ([place]) 11/03/2015  . OSA on CPAP 11/03/2015  . Retrognathia 11/03/2015    Surgeries: Procedure(s): TOTAL HIP ARTHROPLASTY ANTERIOR APPROACH on 04/11/2018   Consultants:   Discharged Condition: Improved  Hospital Course: SAVYON LOKEN is an 77 y.o. male who was admitted 04/11/2018 for operative treatment ofPrimary osteoarthritis of left hip. Patient has severe unremitting pain that affects sleep, daily activities, and work/hobbies. After pre-op clearance the patient was taken to the operating room on 04/11/2018 and underwent  Procedure(s): TOTAL HIP ARTHROPLASTY ANTERIOR APPROACH.    Patient was given perioperative antibiotics:  Anti-infectives (From admission, onward)   Start     Dose/Rate Route Frequency Ordered Stop   04/11/18 1830  ceFAZolin (ANCEF) IVPB 2g/100 mL premix     2 g 200 mL/hr over 30 Minutes Intravenous Every 6 hours 04/11/18 1607 04/12/18 0121   04/11/18 1200  ceFAZolin (ANCEF) IVPB 2g/100 mL premix     2 g 200 mL/hr over 30 Minutes Intravenous To ShortStay Surgical 04/10/18 1005 04/11/18 1225       Patient was  given sequential compression devices, early ambulation, and chemoprophylaxis to prevent DVT.  Patient benefited maximally from hospital stay and there were no complications.    Recent vital signs:  Patient Vitals for the past 24 hrs:  BP Temp Temp src Pulse Resp SpO2  04/13/18 0445 135/75 98.4 F (36.9 C) Oral 89 20 100 %  04/12/18 2055 (!) 113/52 99.1 F (37.3 C) Oral (!) 103 16 100 %  04/12/18 1520 115/66 99.3 F (37.4 C) Oral (!) 102 - 100 %  04/12/18 1057 121/69 99.3 F (37.4 C) Oral 89 18 98 %  04/12/18 0845 114/60 98.3 F (36.8 C) Oral 87 16 100 %  04/12/18 0833 109/66 98.5 F (36.9 C) Oral 85 - 100 %     Recent laboratory studies:  Recent Labs    04/11/18 1428 04/12/18 0358 04/13/18 0451  WBC  --  9.7 10.0  HGB 11.2* 8.2* 9.0*  HCT 33.0* 24.7* 27.1*  PLT  --  137* 138*  NA 140 134*  --   K 4.1 4.3  --   CL  --  101  --   CO2  --  23  --   BUN  --  18  --   CREATININE  --  1.41*  --   GLUCOSE 101* 138*  --   CALCIUM  --  8.1*  --      Discharge Medications:   Allergies as of 04/13/2018   No Known Allergies     Medication List  TAKE these medications   aspirin 325 MG EC tablet Take 1 tablet (325 mg total) by mouth 2 (two) times daily after a meal. What changed:    medication strength  how much to take  when to take this   hydrochlorothiazide 12.5 MG capsule Commonly known as:  MICROZIDE Take 12.5 mg by mouth daily.   HYDROcodone-acetaminophen 5-325 MG tablet Commonly known as:  NORCO/VICODIN Take 1-2 tablets by mouth every 4 (four) hours as needed for moderate pain (pain score 4-6).   lisinopril 10 MG tablet Commonly known as:  PRINIVIL,ZESTRIL Take 10 mg by mouth daily.   tiZANidine 4 MG tablet Commonly known as:  ZANAFLEX Take 1 tablet (4 mg total) by mouth every 6 (six) hours as needed.   Vitamin D-3 1000 units Caps Take 1 capsule by mouth daily.            Durable Medical Equipment  (From admission, onward)          Start     Ordered   04/11/18 1608  DME Walker rolling  Once    Question:  Patient needs a walker to treat with the following condition  Answer:  Primary osteoarthritis of right hip   04/11/18 1607   04/11/18 1608  DME 3 n 1  Once     04/11/18 1607   04/11/18 1608  DME Bedside commode  Once    Question:  Patient needs a bedside commode to treat with the following condition  Answer:  Primary osteoarthritis of right hip   04/11/18 1607          Diagnostic Studies: Dg Chest 2 View  Result Date: 04/01/2018 CLINICAL DATA:  Preoperative evaluation EXAM: CHEST - 2 VIEW COMPARISON:  Chest radiograph 03/01/2014 FINDINGS: Stable cardiac and mediastinal contours. No consolidative pulmonary opacities. No pleural effusion or pneumothorax. Thoracic spine degenerative changes. IMPRESSION: No acute cardiopulmonary process. Electronically Signed   By: Lovey Newcomer M.D.   On: 04/01/2018 00:30   Dg C-arm 1-60 Min  Result Date: 04/11/2018 CLINICAL DATA:  Left total hip arthroplasty EXAM: OPERATIVE left HIP (WITH PELVIS IF PERFORMED) 2 VIEWS TECHNIQUE: Fluoroscopic spot image(s) were submitted for interpretation post-operatively. COMPARISON:  None. FINDINGS: Fluoroscopic images were obtained during left total hip arthroplasty. No immediate abnormality is evident. IMPRESSION: Intraoperative fluoroscopy. Electronically Signed   By: Ulyses Jarred M.D.   On: 04/11/2018 14:52   Dg C-arm 1-60 Min  Result Date: 04/11/2018 CLINICAL DATA:  Left total hip arthroplasty EXAM: OPERATIVE left HIP (WITH PELVIS IF PERFORMED) 2 VIEWS TECHNIQUE: Fluoroscopic spot image(s) were submitted for interpretation post-operatively. COMPARISON:  None. FINDINGS: Fluoroscopic images were obtained during left total hip arthroplasty. No immediate abnormality is evident. IMPRESSION: Intraoperative fluoroscopy. Electronically Signed   By: Ulyses Jarred M.D.   On: 04/11/2018 14:52   Dg Hip Operative Unilat With Pelvis Left  Result Date:  04/11/2018 CLINICAL DATA:  Left total hip arthroplasty EXAM: OPERATIVE left HIP (WITH PELVIS IF PERFORMED) 2 VIEWS TECHNIQUE: Fluoroscopic spot image(s) were submitted for interpretation post-operatively. COMPARISON:  None. FINDINGS: Fluoroscopic images were obtained during left total hip arthroplasty. No immediate abnormality is evident. IMPRESSION: Intraoperative fluoroscopy. Electronically Signed   By: Ulyses Jarred M.D.   On: 04/11/2018 14:52    Disposition: Discharge disposition: 01-Home or Self Care       Discharge Instructions    Call MD / Call 911   Complete by:  As directed    If you experience chest pain or shortness of  breath, CALL 911 and be transported to the hospital emergency room.  If you develope a fever above 101 F, pus (white drainage) or increased drainage or redness at the wound, or calf pain, call your surgeon's office.   Constipation Prevention   Complete by:  As directed    Drink plenty of fluids.  Prune juice may be helpful.  You may use a stool softener, such as Colace (over the counter) 100 mg twice a day.  Use MiraLax (over the counter) for constipation as needed.   Diet - low sodium heart healthy   Complete by:  As directed    Discharge instructions   Complete by:  As directed    INSTRUCTIONS AFTER JOINT REPLACEMENT   Remove items at home which could result in a fall. This includes throw rugs or furniture in walking pathways ICE to the affected joint every three hours while awake for 30 minutes at a time, for at least the first 3-5 days, and then as needed for pain and swelling.  Continue to use ice for pain and swelling. You may notice swelling that will progress down to the foot and ankle.  This is normal after surgery.  Elevate your leg when you are not up walking on it.   Continue to use the breathing machine you got in the hospital (incentive spirometer) which will help keep your temperature down.  It is common for your temperature to cycle up and down  following surgery, especially at night when you are not up moving around and exerting yourself.  The breathing machine keeps your lungs expanded and your temperature down.   DIET:  As you were doing prior to hospitalization, we recommend a well-balanced diet.  DRESSING / WOUND CARE / SHOWERING  You may shower 3 days after surgery, but keep the wounds dry during showering.  You may use an occlusive plastic wrap (Press'n Seal for example), NO SOAKING/SUBMERGING IN THE BATHTUB.  If the bandage gets wet, change with a clean dry gauze.  If the incision gets wet, pat the wound dry with a clean towel.  ACTIVITY  Increase activity slowly as tolerated, but follow the weight bearing instructions below.   No driving for 6 weeks or until further direction given by your physician.  You cannot drive while taking narcotics.  No lifting or carrying greater than 10 lbs. until further directed by your surgeon. Avoid periods of inactivity such as sitting longer than an hour when not asleep. This helps prevent blood clots.  You may return to work once you are authorized by your doctor.     WEIGHT BEARING   Weight bearing as tolerated with assist device (walker, cane, etc) as directed, use it as long as suggested by your surgeon or therapist, typically at least 4-6 weeks.   EXERCISES  Results after joint replacement surgery are often greatly improved when you follow the exercise, range of motion and muscle strengthening exercises prescribed by your doctor. Safety measures are also important to protect the joint from further injury. Any time any of these exercises cause you to have increased pain or swelling, decrease what you are doing until you are comfortable again and then slowly increase them. If you have problems or questions, call your caregiver or physical therapist for advice.   Rehabilitation is important following a joint replacement. After just a few days of immobilization, the muscles of the leg  can become weakened and shrink (atrophy).  These exercises are designed to build up the  tone and strength of the thigh and leg muscles and to improve motion. Often times heat used for twenty to thirty minutes before working out will loosen up your tissues and help with improving the range of motion but do not use heat for the first two weeks following surgery (sometimes heat can increase post-operative swelling).   These exercises can be done on a training (exercise) mat, on the floor, on a table or on a bed. Use whatever works the best and is most comfortable for you.    Use music or television while you are exercising so that the exercises are a pleasant break in your day. This will make your life better with the exercises acting as a break in your routine that you can look forward to.   Perform all exercises about fifteen times, three times per day or as directed.  You should exercise both the operative leg and the other leg as well.   Exercises include:   Quad Sets - Tighten up the muscle on the front of the thigh (Quad) and hold for 5-10 seconds.   Straight Leg Raises - With your knee straight (if you were given a brace, keep it on), lift the leg to 60 degrees, hold for 3 seconds, and slowly lower the leg.  Perform this exercise against resistance later as your leg gets stronger.  Leg Slides: Lying on your back, slowly slide your foot toward your buttocks, bending your knee up off the floor (only go as far as is comfortable). Then slowly slide your foot back down until your leg is flat on the floor again.  Angel Wings: Lying on your back spread your legs to the side as far apart as you can without causing discomfort.  Hamstring Strength:  Lying on your back, push your heel against the floor with your leg straight by tightening up the muscles of your buttocks.  Repeat, but this time bend your knee to a comfortable angle, and push your heel against the floor.  You may put a pillow under the heel to make  it more comfortable if necessary.   A rehabilitation program following joint replacement surgery can speed recovery and prevent re-injury in the future due to weakened muscles. Contact your doctor or a physical therapist for more information on knee rehabilitation.    CONSTIPATION  Constipation is defined medically as fewer than three stools per week and severe constipation as less than one stool per week.  Even if you have a regular bowel pattern at home, your normal regimen is likely to be disrupted due to multiple reasons following surgery.  Combination of anesthesia, postoperative narcotics, change in appetite and fluid intake all can affect your bowels.   YOU MUST use at least one of the following options; they are listed in order of increasing strength to get the job done.  They are all available over the counter, and you may need to use some, POSSIBLY even all of these options:    Drink plenty of fluids (prune juice may be helpful) and high fiber foods Colace 100 mg by mouth twice a day  Senokot for constipation as directed and as needed Dulcolax (bisacodyl), take with full glass of water  Miralax (polyethylene glycol) once or twice a day as needed.  If you have tried all these things and are unable to have a bowel movement in the first 3-4 days after surgery call either your surgeon or your primary doctor.    If you experience loose  stools or diarrhea, hold the medications until you stool forms back up.  If your symptoms do not get better within 1 week or if they get worse, check with your doctor.  If you experience "the worst abdominal pain ever" or develop nausea or vomiting, please contact the office immediately for further recommendations for treatment.   ITCHING:  If you experience itching with your medications, try taking only a single pain pill, or even half a pain pill at a time.  You can also use Benadryl over the counter for itching or also to help with sleep.   TED HOSE  STOCKINGS:  Use stockings on both legs until for at least 2 weeks or as directed by physician office. They may be removed at night for sleeping.  MEDICATIONS:  See your medication summary on the "After Visit Summary" that nursing will review with you.  You may have some home medications which will be placed on hold until you complete the course of blood thinner medication.  It is important for you to complete the blood thinner medication as prescribed.  PRECAUTIONS:  If you experience chest pain or shortness of breath - call 911 immediately for transfer to the hospital emergency department.   If you develop a fever greater that 101 F, purulent drainage from wound, increased redness or drainage from wound, foul odor from the wound/dressing, or calf pain - CONTACT YOUR SURGEON.                                                   FOLLOW-UP APPOINTMENTS:  If you do not already have a post-op appointment, please call the office for an appointment to be seen by your surgeon.  Guidelines for how soon to be seen are listed in your "After Visit Summary", but are typically between 1-4 weeks after surgery.  OTHER INSTRUCTIONS:   Knee Replacement:  Do not place pillow under knee, focus on keeping the knee straight while resting. CPM instructions: 0-90 degrees, 2 hours in the morning, 2 hours in the afternoon, and 2 hours in the evening. Place foam block, curve side up under heel at all times except when in CPM or when walking.  DO NOT modify, tear, cut, or change the foam block in any way.  MAKE SURE YOU:  Understand these instructions.  Get help right away if you are not doing well or get worse.    Thank you for letting us be a part of your medical care team.  It is a privilege we respect greatly.  We hope these instructions will help you stay on track for a fast and full recovery!   Increase activity slowly as tolerated   Complete by:  As directed       Follow-up Information    Melrose Nakayama, MD.  Schedule an appointment as soon as possible for a visit in 2 weeks.   Specialty:  Orthopedic Surgery Contact information: [place] [address] [phone]            Signed: Rich Fuchs 04/13/2018, 7:05 AM

## 2018-04-13 NOTE — Progress Notes (Signed)
Physical Therapy Treatment Patient Details Name: Kevin Weiss MRN: [id number] DOB: March 10, 1941 Today's Date: 04/13/2018    History of Present Illness 77 y.o. male admitted on 04/11/18 for elective L direct anterior THA with post op ABLA s/p 1 unit PRBCs.  Pt with significant PMH of HTN, CHF, cardiomegaly, R THA (2015), and L TKA (>15 years ago).      PT Comments    Pt progressing well, he is anxious to d/c home today  Follow Up Recommendations  Follow surgeon's recommendation for DC plan and follow-up therapies;Supervision for mobility/OOB     Equipment Recommendations  None recommended by PT    Recommendations for Other Services       Precautions / Restrictions Precautions Precautions: None Restrictions Weight Bearing Restrictions: No LLE Weight Bearing: Weight bearing as tolerated    Mobility  Bed Mobility Overal bed mobility: Needs Assistance Bed Mobility: Supine to Sit     Supine to sit: Supervision     General bed mobility comments: incr time and effort but no physical assist  Transfers Overall transfer level: Needs assistance Equipment used: Rolling walker (2 wheeled) Transfers: Sit to/from Stand Sit to Stand: Supervision         General transfer comment: x5 for instruction and donning LB clothing; Supervision for safety, cues for hadn and LLE position, to control descent  Ambulation/Gait Ambulation/Gait assistance: Supervision Gait Distance (Feet): 220 Feet Assistive device: Rolling walker (2 wheeled) Gait Pattern/deviations: Step-through pattern;Antalgic     General Gait Details: Pt with mildly antalgic gait pattern, improving wt shift and fluid gait pattern but remains partially reliant on UEs   Stairs Stairs: Yes Stairs assistance: Min guard Stair Management: One rail Right;Step to pattern;Forwards Number of Stairs: 3 General stair comments: cues for sequence   Wheelchair Mobility    Modified Rankin (Stroke Patients Only)        Balance                                            Cognition Arousal/Alertness: Awake/alert Behavior During Therapy: WFL for tasks assessed/performed Overall Cognitive Status: Within Functional Limits for tasks assessed                                        Exercises Total Joint Exercises Marching in Standing Limitations: reviewed with pt, demo'd standing ex, pt verbal izes but fatigued from gait and requesting to get dressed instead of performing HEP    General Comments        Pertinent Vitals/Pain Pain Assessment: 0-10 Pain Score: 4  Pain Location: left hip Pain Descriptors / Indicators: Aching;Tightness Pain Intervention(s): Limited activity within patient's tolerance;Monitored during session    Home Living                      Prior Function            PT Goals (current goals can now be found in the care plan section) Acute Rehab PT Goals Patient Stated Goal: to get back to being able to do yard work and play golf without pain PT Goal Formulation: With patient Time For Goal Achievement: 04/19/18 Potential to Achieve Goals: Good Progress towards PT goals: Progressing toward goals    Frequency    7X/week  PT Plan Current plan remains appropriate    Co-evaluation              AM-PAC PT "6 Clicks" Daily Activity  Outcome Measure  Difficulty turning over in bed (including adjusting bedclothes, sheets and blankets)?: A Lot Difficulty moving from lying on back to sitting on the side of the bed? : A Lot Difficulty sitting down on and standing up from a chair with arms (e.g., wheelchair, bedside commode, etc,.)?: A Lot Help needed moving to and from a bed to chair (including a wheelchair)?: A Little Help needed walking in hospital room?: A Little Help needed climbing 3-5 steps with a railing? : A Little 6 Click Score: 15    End of Session Equipment Utilized During Treatment: Gait belt Activity  Tolerance: Patient tolerated treatment well Patient left: in chair;with call bell/phone within reach Nurse Communication: Mobility status PT Visit Diagnosis: Muscle weakness (generalized) (M62.81);Difficulty in walking, not elsewhere classified (R26.2);Pain Pain - Right/Left: Left Pain - part of body: Hip     Time: 3846-6599 PT Time Calculation (min) (ACUTE ONLY): 24 min  Charges:  $Gait Training: 23-37 mins                     Kenyon Ana, [place] Pager: [phone] 04/13/2018    [facility] 04/13/2018, 12:34 PM

## 2018-04-13 NOTE — Progress Notes (Signed)
Subjective: 2 Days Post-Op Procedure(s) (LRB): TOTAL HIP ARTHROPLASTY ANTERIOR APPROACH (Left)   Patient doing well and wishes to go home today.   Activity level:  wabt Diet tolerance:  ok Voiding:  ok Patient reports pain as mild.    Objective: Vital signs in last 24 hours: Temp:  [98.3 F (36.8 C)-99.3 F (37.4 C)] 98.4 F (36.9 C) (08/29 0445) Pulse Rate:  [85-103] 89 (08/29 0445) Resp:  [16-20] 20 (08/29 0445) BP: (109-135)/(52-75) 135/75 (08/29 0445) SpO2:  [98 %-100 %] 100 % (08/29 0445)  Labs: Recent Labs    04/11/18 1347 04/11/18 1428 04/12/18 0358 04/13/18 0451  HGB 12.6* 11.2* 8.2* 9.0*   Recent Labs    04/12/18 0358 04/13/18 0451  WBC 9.7 10.0  RBC 2.84* 3.17*  HCT 24.7* 27.1*  PLT 137* 138*   Recent Labs    04/11/18 1428 04/12/18 0358  NA 140 134*  K 4.1 4.3  CL  --  101  CO2  --  23  BUN  --  18  CREATININE  --  1.41*  GLUCOSE 101* 138*  CALCIUM  --  8.1*   No results for input(s): LABPT, INR in the last 72 hours.  Physical Exam:  Neurologically intact ABD soft Neurovascular intact Sensation intact distally Intact pulses distally Dorsiflexion/Plantar flexion intact Incision: dressing C/D/I and no drainage No cellulitis present Compartment soft  Assessment/Plan:  2 Days Post-Op Procedure(s) (LRB): TOTAL HIP ARTHROPLASTY ANTERIOR APPROACH (Left) Advance diet Up with therapy Discharge home with home health today after PT this morning if doing well and cleared by PT. Continue on ASA 325mg  BID x 4 weeks post op. Follow up in office 2 weeks post op.  Savita Runner, Larwance Sachs 04/13/2018, 7:02 AM

## 2018-04-14 DIAGNOSIS — Z471 Aftercare following joint replacement surgery: Secondary | ICD-10-CM | POA: Diagnosis not present

## 2018-04-14 DIAGNOSIS — I110 Hypertensive heart disease with heart failure: Secondary | ICD-10-CM | POA: Diagnosis not present

## 2018-04-14 DIAGNOSIS — Z96642 Presence of left artificial hip joint: Secondary | ICD-10-CM | POA: Diagnosis not present

## 2018-04-14 DIAGNOSIS — I509 Heart failure, unspecified: Secondary | ICD-10-CM | POA: Diagnosis not present

## 2018-04-14 DIAGNOSIS — Z6838 Body mass index (BMI) 38.0-38.9, adult: Secondary | ICD-10-CM | POA: Diagnosis not present

## 2018-04-18 DIAGNOSIS — Z96642 Presence of left artificial hip joint: Secondary | ICD-10-CM | POA: Diagnosis not present

## 2018-04-18 DIAGNOSIS — I509 Heart failure, unspecified: Secondary | ICD-10-CM | POA: Diagnosis not present

## 2018-04-18 DIAGNOSIS — Z471 Aftercare following joint replacement surgery: Secondary | ICD-10-CM | POA: Diagnosis not present

## 2018-04-18 DIAGNOSIS — I110 Hypertensive heart disease with heart failure: Secondary | ICD-10-CM | POA: Diagnosis not present

## 2018-04-18 DIAGNOSIS — Z6838 Body mass index (BMI) 38.0-38.9, adult: Secondary | ICD-10-CM | POA: Diagnosis not present

## 2018-04-20 DIAGNOSIS — Z96642 Presence of left artificial hip joint: Secondary | ICD-10-CM | POA: Diagnosis not present

## 2018-04-20 DIAGNOSIS — I110 Hypertensive heart disease with heart failure: Secondary | ICD-10-CM | POA: Diagnosis not present

## 2018-04-20 DIAGNOSIS — I509 Heart failure, unspecified: Secondary | ICD-10-CM | POA: Diagnosis not present

## 2018-04-20 DIAGNOSIS — Z471 Aftercare following joint replacement surgery: Secondary | ICD-10-CM | POA: Diagnosis not present

## 2018-04-20 DIAGNOSIS — Z6838 Body mass index (BMI) 38.0-38.9, adult: Secondary | ICD-10-CM | POA: Diagnosis not present

## 2018-04-21 DIAGNOSIS — I509 Heart failure, unspecified: Secondary | ICD-10-CM | POA: Diagnosis not present

## 2018-04-21 DIAGNOSIS — Z96642 Presence of left artificial hip joint: Secondary | ICD-10-CM | POA: Diagnosis not present

## 2018-04-21 DIAGNOSIS — M1612 Unilateral primary osteoarthritis, left hip: Secondary | ICD-10-CM | POA: Diagnosis not present

## 2018-04-21 DIAGNOSIS — Z6838 Body mass index (BMI) 38.0-38.9, adult: Secondary | ICD-10-CM | POA: Diagnosis not present

## 2018-04-21 DIAGNOSIS — I110 Hypertensive heart disease with heart failure: Secondary | ICD-10-CM | POA: Diagnosis not present

## 2018-04-21 DIAGNOSIS — Z96643 Presence of artificial hip joint, bilateral: Secondary | ICD-10-CM | POA: Diagnosis not present

## 2018-04-21 DIAGNOSIS — Z471 Aftercare following joint replacement surgery: Secondary | ICD-10-CM | POA: Diagnosis not present

## 2018-04-24 DIAGNOSIS — I110 Hypertensive heart disease with heart failure: Secondary | ICD-10-CM | POA: Diagnosis not present

## 2018-04-24 DIAGNOSIS — I509 Heart failure, unspecified: Secondary | ICD-10-CM | POA: Diagnosis not present

## 2018-04-24 DIAGNOSIS — G4733 Obstructive sleep apnea (adult) (pediatric): Secondary | ICD-10-CM | POA: Diagnosis not present

## 2018-04-24 DIAGNOSIS — Z471 Aftercare following joint replacement surgery: Secondary | ICD-10-CM | POA: Diagnosis not present

## 2018-04-24 DIAGNOSIS — Z6838 Body mass index (BMI) 38.0-38.9, adult: Secondary | ICD-10-CM | POA: Diagnosis not present

## 2018-04-24 DIAGNOSIS — Z96642 Presence of left artificial hip joint: Secondary | ICD-10-CM | POA: Diagnosis not present

## 2018-04-26 ENCOUNTER — Telehealth: Payer: Self-pay | Admitting: *Deleted

## 2018-04-26 DIAGNOSIS — M25652 Stiffness of left hip, not elsewhere classified: Secondary | ICD-10-CM | POA: Diagnosis not present

## 2018-04-26 DIAGNOSIS — Z96642 Presence of left artificial hip joint: Secondary | ICD-10-CM | POA: Diagnosis not present

## 2018-04-26 NOTE — Telephone Encounter (Signed)
Pt states he was told not to take the meloxicam by someone in our office. I reviewed the 02/27/2018 Telephone Call Medication and pt was instructed pt not to take the meloxicam on that date until evaluated by the PCP. I told pt today not to take the Meloxicam until seen by the PCP and pt states understanding.

## 2018-04-27 DIAGNOSIS — M25652 Stiffness of left hip, not elsewhere classified: Secondary | ICD-10-CM | POA: Diagnosis not present

## 2018-04-27 DIAGNOSIS — Z96642 Presence of left artificial hip joint: Secondary | ICD-10-CM | POA: Diagnosis not present

## 2018-05-01 DIAGNOSIS — Z96642 Presence of left artificial hip joint: Secondary | ICD-10-CM | POA: Diagnosis not present

## 2018-05-01 DIAGNOSIS — M25652 Stiffness of left hip, not elsewhere classified: Secondary | ICD-10-CM | POA: Diagnosis not present

## 2018-05-03 DIAGNOSIS — M25652 Stiffness of left hip, not elsewhere classified: Secondary | ICD-10-CM | POA: Diagnosis not present

## 2018-05-03 DIAGNOSIS — Z96642 Presence of left artificial hip joint: Secondary | ICD-10-CM | POA: Diagnosis not present

## 2018-05-04 DIAGNOSIS — Z23 Encounter for immunization: Secondary | ICD-10-CM | POA: Diagnosis not present

## 2018-05-04 DIAGNOSIS — I10 Essential (primary) hypertension: Secondary | ICD-10-CM | POA: Diagnosis not present

## 2018-05-04 DIAGNOSIS — I441 Atrioventricular block, second degree: Secondary | ICD-10-CM | POA: Diagnosis not present

## 2018-05-04 DIAGNOSIS — I739 Peripheral vascular disease, unspecified: Secondary | ICD-10-CM | POA: Diagnosis not present

## 2018-05-04 DIAGNOSIS — Z96641 Presence of right artificial hip joint: Secondary | ICD-10-CM | POA: Diagnosis not present

## 2018-05-05 ENCOUNTER — Ambulatory Visit (HOSPITAL_COMMUNITY)
Admission: RE | Admit: 2018-05-05 | Discharge: 2018-05-05 | Disposition: A | Discharge status: Home / Self Care | Payer: Medicare HMO | Source: Ambulatory Visit | Attending: Vascular Surgery | Admitting: Vascular Surgery

## 2018-05-05 ENCOUNTER — Other Ambulatory Visit: Payer: Self-pay

## 2018-05-05 ENCOUNTER — Ambulatory Visit (INDEPENDENT_AMBULATORY_CARE_PROVIDER_SITE_OTHER): Payer: Medicare HMO | Admitting: Vascular Surgery

## 2018-05-05 ENCOUNTER — Encounter: Payer: Self-pay | Admitting: Vascular Surgery

## 2018-05-05 VITALS — BP 117/76 | HR 73 | Temp 97.1°F | Resp 20 | Ht 68.0 in | Wt 243.0 lb

## 2018-05-05 DIAGNOSIS — M79605 Pain in left leg: Secondary | ICD-10-CM | POA: Diagnosis not present

## 2018-05-05 DIAGNOSIS — Z96642 Presence of left artificial hip joint: Secondary | ICD-10-CM | POA: Diagnosis not present

## 2018-05-05 DIAGNOSIS — M25652 Stiffness of left hip, not elsewhere classified: Secondary | ICD-10-CM | POA: Diagnosis not present

## 2018-05-05 NOTE — Progress Notes (Signed)
Patient ID: Kevin Weiss, male   DOB: September 09, 1940, 77 y.o.   MRN: [id number]  Reason for Consult: New Patient (Initial Visit) (PVD.  L thigh pain.  Audery Amel, [place].)   Referred by Glendale Chard, MD  Subjective:     HPI:  Kevin Weiss is a 77 y.o. male here for evaluation of left leg pain.  He states that he had pain that began his medial left thigh radiating down to his knee.  He has subsequently undergone left hip replacement and his pain is resolved.  He is a former smoker but did quit several years ago.  He is able to walk without limitation at this time.  He has no tissue loss or ulceration.  He has no history of aneurysm in his family.  He has no TIA or amaurosis or stroke in his past.  Past Medical History:  Diagnosis Date  . Arthritis   . Cardiomegaly 10/13/2006   Qualifier: Diagnosis of  By: Eusebio Friendly    . CHF, EJECTION FRACTION > OR = 50% 10/13/2006   Qualifier: Diagnosis of  By: Eusebio Friendly    . Degenerative joint disease (DJD) of hip 03/12/2014  . GASTROESOPHAGEAL REFLUX, NO ESOPHAGITIS 10/13/2006   patient denies  . HEMORRHOIDS, NOS 10/13/2006   Qualifier: Diagnosis of  By: Eusebio Friendly    . History of colon polyps   . Hypertension    takes Amlodipine and Prinizide daily  . 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 Morbid obesity ([place]) 11/03/2015  . OSA on CPAP 11/03/2015  . Retrognathia 11/03/2015   Family History  Problem Relation Age of Onset  . Hypertension Mother   . Hyperlipidemia Mother   . Alzheimer's disease Mother   . Heart disease Father   . Hyperlipidemia Father   . Hypertension Father   . Hypertension Sister   . Hypertension Brother   . Heart attack Maternal Grandmother    Past Surgical History:  Procedure Laterality Date  . COLONOSCOPY    . KNEE ARTHROSCOPY Left 15+yrs ago  . TOTAL HIP ARTHROPLASTY Right 03/12/2014   Procedure: TOTAL HIP ARTHROPLASTY ANTERIOR  APPROACH;  Surgeon: Hessie Dibble, MD;  Location: [place];  Service: Orthopedics;  Laterality: Right;  . TOTAL HIP ARTHROPLASTY Left 04/11/2018   Procedure: TOTAL HIP ARTHROPLASTY ANTERIOR APPROACH;  Surgeon: Melrose Nakayama, MD;  Location: [place];  Service: Orthopedics;  Laterality: Left;    Short Social History:  Social History   Tobacco Use  . Smoking status: Former Smoker    Packs/day: 0.25    Years: 10.00    Pack years: 2.50  . Smokeless tobacco: Never Used  . Tobacco comment: 40 + years  Substance Use Topics  . Alcohol use: Never    Frequency: Never    Comment: rarely    No Known Allergies  Current Outpatient Medications  Medication Sig Dispense Refill  . aspirin EC 325 MG EC tablet Take 1 tablet (325 mg total) by mouth 2 (two) times daily after a meal. 60 tablet 0  . Cholecalciferol (VITAMIN D-3) 1000 UNITS CAPS Take 1 capsule by mouth daily.    . hydrochlorothiazide (MICROZIDE) 12.5 MG capsule Take 12.5 mg by mouth daily.    Marland Kitchen lisinopril (PRINIVIL,ZESTRIL) 10 MG tablet Take 10 mg by mouth daily.    Marland Kitchen tiZANidine (ZANAFLEX) 4 MG tablet Take 1 tablet (4 mg total) by mouth every 6 (six) hours as needed. [place]  tablet 1  . HYDROcodone-acetaminophen (NORCO/VICODIN) 5-325 MG tablet Take 1-2 tablets by mouth every 4 (four) hours as needed for moderate pain (pain score 4-6). (Patient not taking: Reported on 05/05/2018) 40 tablet 0   No current facility-administered medications for this visit.     Review of Systems  Constitutional:  Constitutional negative. HENT: HENT negative.  Eyes: Eyes negative.  GI: Gastrointestinal negative.  Musculoskeletal: Musculoskeletal negative.  Skin: Skin negative.  Neurological: Neurological negative. Psychiatric: Psychiatric negative.        Objective:  Objective   Vitals:   05/05/18 1153  BP: 117/76  Pulse: 73  Resp: 20  Temp: (!) 97.1 F (36.2 C)  TempSrc: Oral  SpO2: 100%  Weight: 243 lb (110.2 kg)  Height: 5\' 8"  (1.727 m)    Body mass index is 36.95 kg/m.  Physical Exam  Constitutional: He is oriented to person, place, and time. He appears well-developed.  HENT:  Head: Normocephalic.  Eyes: Pupils are equal, round, and reactive to light.  Neck: Normal range of motion. Neck supple.  Cardiovascular: Normal rate.  Pulses:      Carotid pulses are 2+ on the right side, and 2+ on the left side.      Radial pulses are 2+ on the right side, and 2+ on the left side.       Dorsalis pedis pulses are 2+ on the right side, and 2+ on the left side.       Posterior tibial pulses are 2+ on the right side, and 2+ on the left side.  Abdominal: Soft. He exhibits no mass.  Musculoskeletal: Normal range of motion. He exhibits no edema.  Neurological: He is alert and oriented to person, place, and time.  Skin: Skin is warm and dry. Capillary refill takes less than 2 seconds.  Psychiatric: He has a normal mood and affect. His behavior is normal. Judgment and thought content normal.    Data: I have independently interpreted his ABIs to be normal bilaterally.     Assessment/Plan:     77 year old male here for evaluation of left lower extremity pain that is resolved after left hip replacement.  ABIs were normal.  At this time he does not need vascular surgical follow-up and can be seen on a as needed basis.     Waynetta Sandy MD [facility]

## 2018-05-08 DIAGNOSIS — Z96642 Presence of left artificial hip joint: Secondary | ICD-10-CM | POA: Diagnosis not present

## 2018-05-08 DIAGNOSIS — Z471 Aftercare following joint replacement surgery: Secondary | ICD-10-CM | POA: Diagnosis not present

## 2018-05-08 DIAGNOSIS — M25652 Stiffness of left hip, not elsewhere classified: Secondary | ICD-10-CM | POA: Diagnosis not present

## 2018-05-10 DIAGNOSIS — M25652 Stiffness of left hip, not elsewhere classified: Secondary | ICD-10-CM | POA: Diagnosis not present

## 2018-05-10 DIAGNOSIS — Z96642 Presence of left artificial hip joint: Secondary | ICD-10-CM | POA: Diagnosis not present

## 2018-05-16 DIAGNOSIS — Z96642 Presence of left artificial hip joint: Secondary | ICD-10-CM | POA: Diagnosis not present

## 2018-05-16 DIAGNOSIS — M25652 Stiffness of left hip, not elsewhere classified: Secondary | ICD-10-CM | POA: Diagnosis not present

## 2018-05-25 DIAGNOSIS — G4733 Obstructive sleep apnea (adult) (pediatric): Secondary | ICD-10-CM | POA: Diagnosis not present

## 2018-05-29 ENCOUNTER — Other Ambulatory Visit: Payer: Self-pay

## 2018-05-29 DIAGNOSIS — N5201 Erectile dysfunction due to arterial insufficiency: Secondary | ICD-10-CM | POA: Diagnosis not present

## 2018-05-29 DIAGNOSIS — R3915 Urgency of urination: Secondary | ICD-10-CM | POA: Diagnosis not present

## 2018-05-29 DIAGNOSIS — E291 Testicular hypofunction: Secondary | ICD-10-CM | POA: Diagnosis not present

## 2018-05-29 MED ORDER — LISINOPRIL 10 MG PO TABS: 10.0000 mg | ORAL_TABLET | Freq: Every day | ORAL | 0 refills | Status: DC

## 2018-06-19 ENCOUNTER — Other Ambulatory Visit: Payer: Self-pay

## 2018-06-19 MED ORDER — HYDROCHLOROTHIAZIDE 12.5 MG PO CAPS: 12.5000 mg | ORAL_CAPSULE | Freq: Every day | ORAL | 0 refills | Status: DC

## 2018-06-26 DIAGNOSIS — G4733 Obstructive sleep apnea (adult) (pediatric): Secondary | ICD-10-CM | POA: Diagnosis not present

## 2018-07-06 DIAGNOSIS — E291 Testicular hypofunction: Secondary | ICD-10-CM | POA: Diagnosis not present

## 2018-07-10 DIAGNOSIS — M25652 Stiffness of left hip, not elsewhere classified: Secondary | ICD-10-CM | POA: Diagnosis not present

## 2018-07-20 DIAGNOSIS — D500 Iron deficiency anemia secondary to blood loss (chronic): Secondary | ICD-10-CM | POA: Diagnosis not present

## 2018-07-20 DIAGNOSIS — Z86010 Personal history of colonic polyps: Secondary | ICD-10-CM | POA: Diagnosis not present

## 2018-07-20 DIAGNOSIS — D509 Iron deficiency anemia, unspecified: Secondary | ICD-10-CM | POA: Diagnosis not present

## 2018-07-20 DIAGNOSIS — K5900 Constipation, unspecified: Secondary | ICD-10-CM | POA: Diagnosis not present

## 2018-07-20 DIAGNOSIS — R748 Abnormal levels of other serum enzymes: Secondary | ICD-10-CM | POA: Diagnosis not present

## 2018-07-20 DIAGNOSIS — Z1211 Encounter for screening for malignant neoplasm of colon: Secondary | ICD-10-CM | POA: Diagnosis not present

## 2018-07-27 DIAGNOSIS — G4733 Obstructive sleep apnea (adult) (pediatric): Secondary | ICD-10-CM | POA: Diagnosis not present

## 2018-08-01 ENCOUNTER — Encounter: Payer: Self-pay | Admitting: Adult Health

## 2018-08-02 ENCOUNTER — Encounter: Payer: Self-pay | Admitting: Adult Health

## 2018-08-02 ENCOUNTER — Ambulatory Visit (INDEPENDENT_AMBULATORY_CARE_PROVIDER_SITE_OTHER): Payer: Medicare HMO | Admitting: Adult Health

## 2018-08-02 VITALS — BP 120/69 | HR 98 | Ht 68.0 in | Wt 251.4 lb

## 2018-08-02 DIAGNOSIS — Z9989 Dependence on other enabling machines and devices: Secondary | ICD-10-CM

## 2018-08-02 DIAGNOSIS — G4733 Obstructive sleep apnea (adult) (pediatric): Secondary | ICD-10-CM

## 2018-08-02 NOTE — Patient Instructions (Addendum)
Your Plan:  Continue using CPAP nightly and greater than 4 hours each night If your symptoms worsen or you develop new symptoms please let us know.   Thank you for coming to see [place] at [facility]. I hope we have been able to provide you high quality care today.  You may receive a patient satisfaction survey over the next few weeks. We would appreciate your feedback and comments so that we may continue to improve ourselves and the health of our patients. Patient

## 2018-08-02 NOTE — Progress Notes (Signed)
PATIENT: Kevin Weiss DOB: 21-Dec-1940  REASON FOR VISIT: follow up HISTORY FROM: patient  HISTORY OF PRESENT ILLNESS: Today 08/02/18: Mr. Gatta is a 77 year old male with a history of obstructive sleep apnea on CPAP.  His CPAP download indicates that he uses machine 30 out of 30 days for compliance of 100%.  He uses machine greater than 4 hours each night.  On average he uses his machine 7 hours and 33 minutes.  His residual AHI is 3.6 on 7 to 16 cm of water with EPR of 3.  He does not have a significant leak.  He returns today for evaluation.   HISTORY (Copied from Dr.Dohmeier's note)Interval history from 01 August 2017, I follow Mr. Kitchings today who has been a compliant CPAP user for the last 12 months 100% compliance for the last 30 days has been confirmed he is using an AutoSet CPAP between 7 and 16 cm water pressure with 3 cm expiratory pressure relief.  This is a standard response machine percent residual AHI of 1.9.  Average use of time is 7 hours and 4 minutes each night, he does not have central apneas emerging.  No adjustments have to be made he has minimal air leaks, his 95th percentile pressure is 15.1 cm within the limits of his current settings he continues to endorse a high level of fatigue at 44 points, but not excessively daytime sleepy at one point on [place].  He is also not endorsing a significant level of depression. He feels fatigue is related to his obesity- he walks now 30 minutes 5 times a week. He wants to go back to the gym. His wife would join him.    REVIEW OF SYSTEMS: Out of a complete 14 system review of symptoms, the patient complains only of the following symptoms, and all other reviewed systems are negative.  [place] sleepiness score is 1 ALLERGIES: No Known Allergies  HOME MEDICATIONS: Outpatient Medications Prior to Visit  Medication Sig Dispense Refill  . aspirin EC 325 MG EC tablet Take 1 tablet (325 mg total) by mouth 2 (two) times daily  after a meal. 60 tablet 0  . Cholecalciferol (VITAMIN D-3) 1000 UNITS CAPS Take 1 capsule by mouth daily.    . hydrochlorothiazide (MICROZIDE) 12.5 MG capsule Take 1 capsule (12.5 mg total) by mouth daily. 90 capsule 0  . lisinopril (PRINIVIL,ZESTRIL) 10 MG tablet Take 1 tablet (10 mg total) by mouth daily. 90 tablet 0  . oxybutynin (DITROPAN XL) 15 MG 24 hr tablet Take 15 mg by mouth at bedtime.    Marland Kitchen HYDROcodone-acetaminophen (NORCO/VICODIN) 5-325 MG tablet Take 1-2 tablets by mouth every 4 (four) hours as needed for moderate pain (pain score 4-6). (Patient not taking: Reported on 05/05/2018) 40 tablet 0  . tiZANidine (ZANAFLEX) 4 MG tablet Take 1 tablet (4 mg total) by mouth every 6 (six) hours as needed. (Patient not taking: Reported on 08/02/2018) 40 tablet 1   No facility-administered medications prior to visit.     PAST MEDICAL HISTORY: Past Medical History:  Diagnosis Date  . Arthritis   . Cardiomegaly 10/13/2006   Qualifier: Diagnosis of  By: Eusebio Friendly    . CHF, EJECTION FRACTION > OR = 50% 10/13/2006   Qualifier: Diagnosis of  By: Eusebio Friendly    . Degenerative joint disease (DJD) of hip 03/12/2014  . GASTROESOPHAGEAL REFLUX, NO ESOPHAGITIS 10/13/2006   patient denies  . HEMORRHOIDS, NOS 10/13/2006   Qualifier: Diagnosis of  By: Eusebio Friendly    .  History of colon polyps   . Hypertension    takes Amlodipine and Prinizide daily  . 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 Morbid obesity ([place]) 11/03/2015  . OSA on CPAP 11/03/2015  . Retrognathia 11/03/2015    PAST SURGICAL HISTORY: Past Surgical History:  Procedure Laterality Date  . COLONOSCOPY    . KNEE ARTHROSCOPY Left 15+yrs ago  . TOTAL HIP ARTHROPLASTY Right 03/12/2014   Procedure: TOTAL HIP ARTHROPLASTY ANTERIOR APPROACH;  Surgeon: Hessie Dibble, MD;  Location: [place];  Service: Orthopedics;  Laterality: Right;  . TOTAL HIP ARTHROPLASTY Left 04/11/2018    Procedure: TOTAL HIP ARTHROPLASTY ANTERIOR APPROACH;  Surgeon: Melrose Nakayama, MD;  Location: [place];  Service: Orthopedics;  Laterality: Left;    FAMILY HISTORY: Family History  Problem Relation Age of Onset  . Hypertension Mother   . Hyperlipidemia Mother   . Alzheimer's disease Mother   . Heart disease Father   . Hyperlipidemia Father   . Hypertension Father   . Hypertension Sister   . Hypertension Brother   . Heart attack Maternal Grandmother     SOCIAL HISTORY: Social History   Socioeconomic History  . Marital status: Married    Spouse name: Not on file  . Number of children: Not on file  . Years of education: Not on file  . Highest education level: Not on file  Occupational History  . Not on file  Social Needs  . Financial resource strain: Not on file  . Food insecurity:    Worry: Not on file    Inability: Not on file  . Transportation needs:    Medical: Not on file    Non-medical: Not on file  Tobacco Use  . Smoking status: Former Smoker    Packs/day: 0.25    Years: 10.00    Pack years: 2.50  . Smokeless tobacco: Never Used  . Tobacco comment: 40 + years  Substance and Sexual Activity  . Alcohol use: Never    Frequency: Never    Comment: rarely  . Drug use: Yes    Types: Marijuana    Comment: 03/30/18  . Sexual activity: Yes  Lifestyle  . Physical activity:    Days per week: Not on file    Minutes per session: Not on file  . Stress: Not on file  Relationships  . Social connections:    Talks on phone: Not on file    Gets together: Not on file    Attends religious service: Not on file    Active member of club or organization: Not on file    Attends meetings of clubs or organizations: Not on file    Relationship status: Not on file  . Intimate partner violence:    Fear of current or ex partner: Not on file    Emotionally abused: Not on file    Physically abused: Not on file    Forced sexual activity: Not on file  Other Topics Concern  . Not on  file  Social History Narrative   Live at home with wife. Retired from [employer] as a Librarian, academic.       PHYSICAL EXAM  Vitals:   08/02/18 1444  BP: 120/69  Pulse: 98  Weight: 251 lb 6.4 oz (114 kg)  Height: 5\' 8"  (1.727 m)   Body mass index is 38.23 kg/m.  Generalized: Well developed, in no acute distress   Neurological examination  Mentation:  Alert oriented to time, place, history taking. Follows all commands speech and language fluent Cranial nerve II-XII:  Extraocular movements were full, visual field were full on confrontational test. Facial sensation and strength were normal. Uvula tongue midline. Head turning and shoulder shrug  were normal and symmetric.  Neck circumference 18 inches, Mallampati 4+ Motor: The motor testing reveals 5 over 5 strength of all 4 extremities. Good symmetric motor tone is noted throughout.  Sensory: Sensory testing is intact to soft touch on all 4 extremities. No evidence of extinction is noted.  Coordination: Cerebellar testing reveals good finger-nose-finger and heel-to-shin bilaterally.  Gait and station: Gait is normal.    DIAGNOSTIC DATA (LABS, IMAGING, TESTING) - I reviewed patient records, labs, notes, testing and imaging myself where available.  Lab Results  Component Value Date   WBC 10.0 04/13/2018   HGB 9.0 (L) 04/13/2018   HCT 27.1 (L) 04/13/2018   MCV 85.5 04/13/2018   PLT 138 (L) 04/13/2018      Component Value Date/Time   NA 134 (L) 04/12/2018 0358   K 4.3 04/12/2018 0358   CL 101 04/12/2018 0358   CO2 23 04/12/2018 0358   GLUCOSE 138 (H) 04/12/2018 0358   BUN 18 04/12/2018 0358   CREATININE 1.41 (H) 04/12/2018 0358   CALCIUM 8.1 (L) 04/12/2018 0358   GFRNONAA 48 (L) 04/12/2018 0358   GFRAA 55 (L) 04/12/2018 0358      ASSESSMENT AND PLAN 77 y.o. year old male  has a past medical history of Arthritis, Cardiomegaly (10/13/2006), CHF, EJECTION FRACTION > OR = 50% (10/13/2006), Degenerative  joint disease (DJD) of hip (03/12/2014), GASTROESOPHAGEAL REFLUX, NO ESOPHAGITIS (10/13/2006), HEMORRHOIDS, NOS (10/13/2006), History of colon polyps, Hypertension, IMPOTENCE, ORGANIC (10/13/2006), Morbid obesity ([place]) (11/03/2015), OSA on CPAP (11/03/2015), and Retrognathia (11/03/2015). here with:  1.  Obstructive sleep apnea on CPAP  The patient CPAP download shows excellent compliance and good treatment of his apnea.  He is encouraged to continue using the CPAP nightly and greater than 4 hours each night.  He is advised that if his symptoms worsen or he develops new symptoms he should let us know.  He will follow-up in 1 year or sooner if needed.   I spent 15 minutes with the patient. 50% of this time was spent reviewing CPAP download   Ward Givens, MSN, NP-C 08/02/2018, 3:04 PM [facility] [address], [place], [place] [address] [phone]

## 2018-08-04 ENCOUNTER — Ambulatory Visit: Payer: Medicare HMO | Admitting: Nurse Practitioner

## 2018-08-15 ENCOUNTER — Ambulatory Visit (INDEPENDENT_AMBULATORY_CARE_PROVIDER_SITE_OTHER): Payer: Medicare HMO | Admitting: Internal Medicine

## 2018-08-15 ENCOUNTER — Other Ambulatory Visit: Payer: Self-pay | Admitting: Internal Medicine

## 2018-08-15 ENCOUNTER — Encounter: Payer: Self-pay | Admitting: Internal Medicine

## 2018-08-15 VITALS — BP 120/72 | HR 82 | Temp 98.3°F | Ht 66.6 in | Wt 249.2 lb

## 2018-08-15 DIAGNOSIS — E782 Mixed hyperlipidemia: Secondary | ICD-10-CM | POA: Diagnosis not present

## 2018-08-15 DIAGNOSIS — Z96642 Presence of left artificial hip joint: Secondary | ICD-10-CM | POA: Diagnosis not present

## 2018-08-15 DIAGNOSIS — I10 Essential (primary) hypertension: Secondary | ICD-10-CM | POA: Diagnosis not present

## 2018-08-15 NOTE — Progress Notes (Unsigned)
Please request liver enzymes from Dr Lorie Apley office.

## 2018-08-15 NOTE — Progress Notes (Signed)
Subjective:     Patient ID: Kevin Weiss , male    DOB: 04-Aug-1941 , 77 y.o.   MRN: [id number]   Chief Complaint  Patient presents with  . Hypertension    HPI  Here for HTN FU and had L hip replacement and is recovering fine.  Has seen cardiology and vascular specialist. ABI's were normal. Cardiologist confirmed enlarged heart, but was cleared to have the surgery. He will Fu with cardiology in 1 y. He is using his C-pap machine and had it checked last week, and there was no need to increase the pressure.  He does not check his BP at home.   Past Medical History:  Diagnosis Date  . Arthritis   . Cardiomegaly 10/13/2006   Qualifier: Diagnosis of  By: Eusebio Friendly    . CHF, EJECTION FRACTION > OR = 50% 10/13/2006   Qualifier: Diagnosis of  By: Eusebio Friendly    . Degenerative joint disease (DJD) of hip 03/12/2014  . GASTROESOPHAGEAL REFLUX, NO ESOPHAGITIS 10/13/2006   patient denies  . HEMORRHOIDS, NOS 10/13/2006   Qualifier: Diagnosis of  By: Eusebio Friendly    . History of colon polyps   . Hypertension    takes Amlodipine and Prinizide daily  . 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 Morbid obesity ([place]) 11/03/2015  . OSA on CPAP 11/03/2015  . Retrognathia 11/03/2015     Family History  Problem Relation Age of Onset  . Hypertension Mother   . Hyperlipidemia Mother   . Alzheimer's disease Mother   . Heart disease Father   . Hyperlipidemia Father   . Hypertension Father   . Hypertension Sister   . Hypertension Brother   . Heart attack Maternal Grandmother      Current Outpatient Medications:  .  aspirin 81 MG tablet, Take 81 mg by mouth daily., Disp: , Rfl:  .  Cholecalciferol (VITAMIN D-3) 1000 UNITS CAPS, Take 1 capsule by mouth daily., Disp: , Rfl:  .  hydrochlorothiazide (MICROZIDE) 12.5 MG capsule, Take 1 capsule (12.5 mg total) by mouth daily., Disp: 90 capsule, Rfl: 0 .  lisinopril (PRINIVIL,ZESTRIL) 10 MG  tablet, Take 1 tablet (10 mg total) by mouth daily., Disp: 90 tablet, Rfl: 0 .  oxybutynin (DITROPAN XL) 15 MG 24 hr tablet, Take 15 mg by mouth at bedtime., Disp: , Rfl:    No Known Allergies   Review of Systems  Constitutional: Negative for activity change, appetite change, diaphoresis and fever.  HENT: Negative.   Eyes: Negative for discharge and visual disturbance.  Respiratory: Negative for chest tightness and shortness of breath.   Cardiovascular: Negative for chest pain, palpitations and leg swelling.  Gastrointestinal: Negative for abdominal distention, abdominal pain, diarrhea, nausea and vomiting.  Genitourinary: Negative for difficulty urinating.  Musculoskeletal: Positive for arthralgias. Negative for myalgias, neck pain and neck stiffness.  Skin: Negative for rash.  Neurological: Negative for headaches.  Psychiatric/Behavioral: Negative for sleep disturbance.     Today's Vitals   08/15/18 1537  BP: 120/72  Pulse: 82  Temp: 98.3 F (36.8 C)  TempSrc: Oral  SpO2: 96%  Weight: 249 lb 3.2 oz (113 kg)  Height: 5' 6.6" (1.692 m)  PainSc: 0-No pain   Body mass index is 39.5 kg/m.   Objective:  Physical Exam   Constitutional: He is oriented to person, place, and time. He appears well-developed and well-nourished. No distress.  HENT:  Head: Normocephalic and atraumatic.  Right Ear: External ear normal.  Left Ear: External ear normal.  Nose: Nose normal.  Eyes: Conjunctivae are normal. Right eye exhibits no discharge. Left eye exhibits no discharge. No scleral icterus.  Neck: Neck supple. No thyromegaly present.  No carotid bruits bilaterally  Cardiovascular: Normal rate and regular rhythm.  No murmur heard. Pulmonary/Chest: Effort normal and breath sounds normal. No respiratory distress.  Musculoskeletal: Normal range of motion. He exhibits no edema.  Lymphadenopathy: He has no cervical adenopathy.  Neurological: He is alert and oriented to person, place, and  time.  Skin: Skin is warm and dry. Capillary refill takes less than 2 seconds. No rash noted. He is not diaphoretic.  Psychiatric: He has a normal mood and affect. Her behavior is normal. Judgment and thought content normal.  Nursing note reviewed.  Assessment And Plan:  1. Essential hypertension- stable  2. Mixed hyperlipidemia- chronic. Has been working on his diet.  I requested the labs from Dr Lorie Apley office done this month. If he does not have a CMP and CBC, I will order this when he comes for lipid panel on 08/17/18 - Lipid Profile; Future  Yareliz Thorstenson RODRIGUEZ[place]SOUTH[place], PA-C

## 2018-08-17 ENCOUNTER — Other Ambulatory Visit: Payer: Medicare HMO

## 2018-08-17 NOTE — Addendum Note (Signed)
Addended by: Haskel Khan A on: 08/17/2018 10:57 AM   Modules accepted: Orders

## 2018-08-22 ENCOUNTER — Other Ambulatory Visit: Payer: Self-pay

## 2018-08-22 ENCOUNTER — Other Ambulatory Visit: Payer: Self-pay | Admitting: Internal Medicine

## 2018-08-22 DIAGNOSIS — E782 Mixed hyperlipidemia: Secondary | ICD-10-CM | POA: Diagnosis not present

## 2018-08-22 MED ORDER — LISINOPRIL 10 MG PO TABS: 10.0000 mg | ORAL_TABLET | Freq: Every day | ORAL | 0 refills | Status: DC

## 2018-08-23 DIAGNOSIS — D122 Benign neoplasm of ascending colon: Secondary | ICD-10-CM | POA: Diagnosis not present

## 2018-08-23 DIAGNOSIS — Z86010 Personal history of colonic polyps: Secondary | ICD-10-CM | POA: Diagnosis not present

## 2018-08-23 DIAGNOSIS — Z1211 Encounter for screening for malignant neoplasm of colon: Secondary | ICD-10-CM | POA: Diagnosis not present

## 2018-08-23 DIAGNOSIS — K635 Polyp of colon: Secondary | ICD-10-CM | POA: Diagnosis not present

## 2018-08-23 LAB — LIPID PANEL
CHOLESTEROL TOTAL: 159 mg/dL (ref 100–199)
Chol/HDL Ratio: 4 ratio (ref 0.0–5.0)
HDL: 40 mg/dL (ref >39)
LDL Calculated: 106 mg/dL — ABNORMAL HIGH (ref 0–99)
Triglycerides: 63 mg/dL (ref 0–149)
VLDL CHOLESTEROL CAL: 13 mg/dL (ref 5–40)

## 2018-08-24 ENCOUNTER — Telehealth: Payer: Self-pay

## 2018-08-24 NOTE — Telephone Encounter (Signed)
-----   Message from Shelby Mattocks, [place] sent at 08/24/2018  8:55 AM EST ----- Please inform pt that his cholesterol panel is all normal except the bad cholesterol which shows slight elevation of LDL  Which causes blocked arteries and the best way to decrease this number is increasing fiber intake, exercising, no fried foods, eating more garlic and onions.

## 2018-08-28 DIAGNOSIS — G4733 Obstructive sleep apnea (adult) (pediatric): Secondary | ICD-10-CM | POA: Diagnosis not present

## 2018-09-14 ENCOUNTER — Other Ambulatory Visit: Payer: Self-pay

## 2018-09-14 DIAGNOSIS — I10 Essential (primary) hypertension: Secondary | ICD-10-CM

## 2018-09-14 MED ORDER — HYDROCHLOROTHIAZIDE 12.5 MG PO CAPS: 12.5000 mg | ORAL_CAPSULE | Freq: Every day | ORAL | 0 refills | Status: DC

## 2018-09-15 ENCOUNTER — Other Ambulatory Visit: Payer: Self-pay | Admitting: Internal Medicine

## 2018-09-15 DIAGNOSIS — I10 Essential (primary) hypertension: Secondary | ICD-10-CM

## 2018-09-28 DIAGNOSIS — G4733 Obstructive sleep apnea (adult) (pediatric): Secondary | ICD-10-CM | POA: Diagnosis not present

## 2018-10-10 ENCOUNTER — Other Ambulatory Visit: Payer: Self-pay | Admitting: Internal Medicine

## 2018-10-10 DIAGNOSIS — D508 Other iron deficiency anemias: Secondary | ICD-10-CM

## 2018-10-10 MED ORDER — FERROUS SULFATE 325 (65 FE) MG PO TABS: 325.0000 mg | ORAL_TABLET | Freq: Three times a day (TID) | ORAL | 1 refills | Status: DC

## 2018-10-10 NOTE — Progress Notes (Unsigned)
Please request Dr Lorie Apley notes. Call him and tell him I reviewed his labs from her and yes, his iron is low. I sent Iron to start taking.

## 2018-10-20 ENCOUNTER — Encounter: Payer: Self-pay | Admitting: Internal Medicine

## 2018-10-30 DIAGNOSIS — G4733 Obstructive sleep apnea (adult) (pediatric): Secondary | ICD-10-CM | POA: Diagnosis not present

## 2018-11-02 ENCOUNTER — Encounter: Payer: Self-pay | Admitting: Internal Medicine

## 2018-11-02 ENCOUNTER — Other Ambulatory Visit: Payer: Self-pay | Admitting: Internal Medicine

## 2018-11-02 DIAGNOSIS — D508 Other iron deficiency anemias: Secondary | ICD-10-CM

## 2018-11-12 ENCOUNTER — Other Ambulatory Visit: Payer: Self-pay | Admitting: Internal Medicine

## 2018-11-13 ENCOUNTER — Ambulatory Visit: Payer: Medicare HMO | Admitting: Nurse Practitioner

## 2018-11-30 DIAGNOSIS — G4733 Obstructive sleep apnea (adult) (pediatric): Secondary | ICD-10-CM | POA: Diagnosis not present

## 2018-12-07 ENCOUNTER — Other Ambulatory Visit: Payer: Self-pay | Admitting: Internal Medicine

## 2018-12-07 DIAGNOSIS — D508 Other iron deficiency anemias: Secondary | ICD-10-CM

## 2018-12-19 ENCOUNTER — Ambulatory Visit: Payer: Medicare HMO | Admitting: Internal Medicine

## 2018-12-31 ENCOUNTER — Other Ambulatory Visit: Payer: Self-pay | Admitting: Internal Medicine

## 2018-12-31 DIAGNOSIS — D508 Other iron deficiency anemias: Secondary | ICD-10-CM

## 2019-01-01 DIAGNOSIS — G4733 Obstructive sleep apnea (adult) (pediatric): Secondary | ICD-10-CM | POA: Diagnosis not present

## 2019-01-17 ENCOUNTER — Ambulatory Visit: Payer: Medicare HMO | Admitting: Internal Medicine

## 2019-01-23 ENCOUNTER — Other Ambulatory Visit: Payer: Self-pay

## 2019-01-23 ENCOUNTER — Encounter: Payer: Self-pay | Admitting: Nurse Practitioner

## 2019-01-23 ENCOUNTER — Ambulatory Visit (INDEPENDENT_AMBULATORY_CARE_PROVIDER_SITE_OTHER): Payer: Medicare HMO | Admitting: Nurse Practitioner

## 2019-01-23 VITALS — BP 138/80 | HR 80 | Temp 98.6°F | Ht 66.8 in | Wt 250.6 lb

## 2019-01-23 DIAGNOSIS — K5900 Constipation, unspecified: Secondary | ICD-10-CM

## 2019-01-23 DIAGNOSIS — D508 Other iron deficiency anemias: Secondary | ICD-10-CM

## 2019-01-23 MED ORDER — FERROUS SULFATE 325 (65 FE) MG PO TABS: 325.0000 mg | ORAL_TABLET | Freq: Three times a day (TID) | ORAL | 1 refills | Status: DC

## 2019-01-23 MED ORDER — LINACLOTIDE 72 MCG PO CAPS: 72.0000 ug | ORAL_CAPSULE | Freq: Every day | ORAL | 3 refills | Status: DC

## 2019-01-23 NOTE — Progress Notes (Signed)
Subjective:     Patient ID: Kevin Weiss , male    DOB: 09/02/1940 , 78 y.o.   MRN: [id number]   Chief Complaint  Patient presents with  . IRON    HPI  He is here for iron recheck.  He is taking iron supplement.  He had low iron in August - hip replacement.  Does admit to not drinking enough water.    When he took [place] before his colonoscopy.      Past Medical History:  Diagnosis Date  . Arthritis   . Cardiomegaly 10/13/2006   Qualifier: Diagnosis of  By: Eusebio Friendly    . CHF, EJECTION FRACTION > OR = 50% 10/13/2006   Qualifier: Diagnosis of  By: Eusebio Friendly    . Degenerative joint disease (DJD) of hip 03/12/2014  . GASTROESOPHAGEAL REFLUX, NO ESOPHAGITIS 10/13/2006   patient denies  . HEMORRHOIDS, NOS 10/13/2006   Qualifier: Diagnosis of  By: Eusebio Friendly    . History of colon polyps   . Hypertension    takes Amlodipine and Prinizide daily  . 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 Morbid obesity ([place]) 11/03/2015  . OSA on CPAP 11/03/2015  . Retrognathia 11/03/2015     Family History  Problem Relation Age of Onset  . Hypertension Mother   . Hyperlipidemia Mother   . Alzheimer's disease Mother   . Heart disease Father   . Hyperlipidemia Father   . Hypertension Father   . Hypertension Sister   . Hypertension Brother   . Heart attack Maternal Grandmother      Current Outpatient Medications:  .  aspirin 81 MG tablet, Take 81 mg by mouth daily., Disp: , Rfl:  .  Cholecalciferol (VITAMIN D-3) 1000 UNITS CAPS, Take 1 capsule by mouth daily., Disp: , Rfl:  .  ferrous sulfate 325 (65 FE) MG tablet, TAKE 1 TABLET (325 MG TOTAL) BY MOUTH 3 (THREE) TIMES DAILY WITH MEALS., Disp: 90 tablet, Rfl: 1 .  hydrochlorothiazide (MICROZIDE) 12.5 MG capsule, TAKE 1 CAPSULE BY MOUTH DAILY, Disp: 90 capsule, Rfl: 0 .  lisinopril (PRINIVIL,ZESTRIL) 10 MG tablet, TAKE 1 TABLET BY MOUTH EVERY DAY, Disp: 90 tablet, Rfl: 0 .   oxybutynin (DITROPAN XL) 15 MG 24 hr tablet, Take 15 mg by mouth at bedtime., Disp: , Rfl:    No Known Allergies   Review of Systems  Constitutional: Negative.   Respiratory: Negative.   Cardiovascular: Negative.   Musculoskeletal: Negative.   Neurological: Negative.   Hematological: Negative.   Psychiatric/Behavioral: Negative.      Today's Vitals   01/23/19 1057  BP: 138/80  Pulse: 80  Temp: 98.6 F (37 C)  TempSrc: Oral  Weight: 250 lb 9.6 oz (113.7 kg)  Height: 5' 6.8" (1.697 m)  PainSc: 0-No pain   Body mass index is 39.48 kg/m.   Objective:  Physical Exam Vitals signs reviewed.  Constitutional:      Appearance: Normal appearance.  Cardiovascular:     Rate and Rhythm: Normal rate.     Pulses: Normal pulses.     Heart sounds: Normal heart sounds. No murmur.  Pulmonary:     Effort: Pulmonary effort is normal.     Breath sounds: Normal breath sounds.  Musculoskeletal: Normal range of motion.        General: No swelling.  Skin:    General: Skin is warm and dry.     Capillary Refill: Capillary refill takes  less than 2 seconds.  Neurological:     General: No focal deficit present.     Mental Status: He is alert and oriented to person, place, and time.  Psychiatric:        Mood and Affect: Mood normal.        Behavior: Behavior normal.        Thought Content: Thought content normal.        Judgment: Judgment normal.         Assessment And Plan:     1. Other iron deficiency anemia  Will recheck iron studies  He is taking iron supplement 3 times a day and would like to decrease if his levels are better - CBC no Diff - Iron, TIBC and Ferritin Panel - ferrous sulfate 325 (65 FE) MG tablet; Take 1 tablet (325 mg total) by mouth 3 (three) times daily with meals.  Dispense: 90 tablet; Refill: 1  2. Constipation, unspecified constipation type  The iron supplement could be playing a part however he mentions he had problems prior to taking the iron  Sent a  refill for linzess  Also encouraged to make sure to stay well hydrated - Iron, TIBC and Ferritin Panel - linaclotide (LINZESS) 72 MCG capsule; Take 1 capsule (72 mcg total) by mouth daily before breakfast.  Dispense: 30 capsule; Refill: 3   [name], FNP    THE PATIENT IS ENCOURAGED TO PRACTICE SOCIAL DISTANCING DUE TO THE COVID-19 PANDEMIC.

## 2019-01-24 LAB — CBC
Hematocrit: 46.5 % (ref 37.5–51.0)
Hemoglobin: 15.9 g/dL (ref 13.0–17.7)
MCH: 28.1 pg (ref 26.6–33.0)
MCHC: 34.2 g/dL (ref 31.5–35.7)
MCV: 82 fL (ref 79–97)
Platelets: 228 10*3/uL (ref 150–450)
RBC: 5.65 x10E6/uL (ref 4.14–5.80)
RDW: 14.3 % (ref 11.6–15.4)
WBC: 4.9 10*3/uL (ref 3.4–10.8)

## 2019-01-24 LAB — IRON,TIBC AND FERRITIN PANEL
Ferritin: 76 ng/mL (ref 30–400)
Iron Saturation: 21 % (ref 15–55)
Iron: 74 ug/dL (ref 38–169)
Total Iron Binding Capacity: 351 ug/dL (ref 250–450)
UIBC: 277 ug/dL (ref 111–343)

## 2019-01-29 ENCOUNTER — Other Ambulatory Visit: Payer: Self-pay | Admitting: Internal Medicine

## 2019-01-31 ENCOUNTER — Telehealth: Payer: Self-pay | Admitting: *Deleted

## 2019-01-31 NOTE — Telephone Encounter (Signed)
A message was left, re: follow up visit. 

## 2019-02-02 ENCOUNTER — Other Ambulatory Visit: Payer: Self-pay | Admitting: Internal Medicine

## 2019-02-02 DIAGNOSIS — R7303 Prediabetes: Secondary | ICD-10-CM

## 2019-02-02 DIAGNOSIS — D508 Other iron deficiency anemias: Secondary | ICD-10-CM

## 2019-02-02 HISTORY — DX: Prediabetes: R73.03

## 2019-02-04 ENCOUNTER — Encounter: Payer: Self-pay | Admitting: Nurse Practitioner

## 2019-02-12 ENCOUNTER — Other Ambulatory Visit: Payer: Self-pay | Admitting: Nurse Practitioner

## 2019-02-12 DIAGNOSIS — D508 Other iron deficiency anemias: Secondary | ICD-10-CM

## 2019-02-26 DIAGNOSIS — M25652 Stiffness of left hip, not elsewhere classified: Secondary | ICD-10-CM | POA: Diagnosis not present

## 2019-03-06 ENCOUNTER — Other Ambulatory Visit: Payer: Self-pay | Admitting: Internal Medicine

## 2019-03-06 DIAGNOSIS — I10 Essential (primary) hypertension: Secondary | ICD-10-CM

## 2019-03-08 ENCOUNTER — Ambulatory Visit (INDEPENDENT_AMBULATORY_CARE_PROVIDER_SITE_OTHER): Payer: Medicare HMO

## 2019-03-08 ENCOUNTER — Encounter: Payer: Self-pay | Admitting: Internal Medicine

## 2019-03-08 ENCOUNTER — Other Ambulatory Visit: Payer: Self-pay

## 2019-03-08 ENCOUNTER — Ambulatory Visit (INDEPENDENT_AMBULATORY_CARE_PROVIDER_SITE_OTHER): Payer: Medicare HMO | Admitting: Internal Medicine

## 2019-03-08 VITALS — BP 128/78 | HR 82 | Temp 98.2°F | Ht 66.6 in | Wt 251.4 lb

## 2019-03-08 DIAGNOSIS — Z125 Encounter for screening for malignant neoplasm of prostate: Secondary | ICD-10-CM

## 2019-03-08 DIAGNOSIS — I10 Essential (primary) hypertension: Secondary | ICD-10-CM

## 2019-03-08 DIAGNOSIS — Z1211 Encounter for screening for malignant neoplasm of colon: Secondary | ICD-10-CM

## 2019-03-08 DIAGNOSIS — Z0000 Encounter for general adult medical examination without abnormal findings: Secondary | ICD-10-CM | POA: Diagnosis not present

## 2019-03-08 DIAGNOSIS — Z23 Encounter for immunization: Secondary | ICD-10-CM

## 2019-03-08 DIAGNOSIS — D508 Other iron deficiency anemias: Secondary | ICD-10-CM

## 2019-03-08 LAB — POCT URINALYSIS DIPSTICK
Bilirubin, UA: NEGATIVE
Blood, UA: NEGATIVE
Glucose, UA: NEGATIVE
Ketones, UA: NEGATIVE
Leukocytes, UA: NEGATIVE
Nitrite, UA: NEGATIVE
Protein, UA: NEGATIVE
Spec Grav, UA: 1.025 (ref 1.010–1.025)
Urobilinogen, UA: 1 E.U./dL
pH, UA: 5.5 (ref 5.0–8.0)

## 2019-03-08 LAB — POC HEMOCCULT BLD/STL (OFFICE/1-CARD/DIAGNOSTIC): Fecal Occult Blood, POC: NEGATIVE

## 2019-03-08 MED ORDER — PREVNAR 13 IM SUSP: 0.5000 mL | INTRAMUSCULAR | 0 refills | Status: AC

## 2019-03-08 MED ORDER — BOOSTRIX 5-2.5-18.5 LF-MCG/0.5 IM SUSP: 0.5000 mL | Freq: Once | INTRAMUSCULAR | 0 refills | Status: AC

## 2019-03-08 MED ORDER — FERROUS SULFATE 325 (65 FE) MG PO TABS: ORAL_TABLET | ORAL | 0 refills | Status: DC

## 2019-03-08 NOTE — Addendum Note (Signed)
Addended by: Kellie Simmering on: 03/08/2019 03:53 PM   Modules accepted: Orders

## 2019-03-08 NOTE — Progress Notes (Signed)
Subjective:     Patient ID: Kevin Weiss , male    DOB: [birth year] , 78 y.o.   MRN: [id number]   CC " pt is here for a physical"  HPI He is here for a annual physical and Medicare visit done by our LPN.  He walks on treadmill 30 min 3x/week, is working on increasing more time. He does not have any particular complaints.     Past Medical History:  Diagnosis Date  . Arthritis   . Cardiomegaly 10/13/2006   Qualifier: Diagnosis of  By: Eusebio Friendly    . CHF, EJECTION FRACTION > OR = 50% 10/13/2006   Qualifier: Diagnosis of  By: Eusebio Friendly    . Degenerative joint disease (DJD) of hip 03/12/2014  . GASTROESOPHAGEAL REFLUX, NO ESOPHAGITIS 10/13/2006   patient denies  . HEMORRHOIDS, NOS 10/13/2006   Qualifier: Diagnosis of  By: Eusebio Friendly    . History of colon polyps   . Hypertension    takes Amlodipine and Prinizide daily  . 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 Morbid obesity ([place]) 11/03/2015  . OSA on CPAP 11/03/2015  . Retrognathia 11/03/2015     Family History  Problem Relation Age of Onset  . Hypertension Mother   . Hyperlipidemia Mother   . Alzheimer's disease Mother   . Heart disease Father   . Hyperlipidemia Father   . Hypertension Father   . Hypertension Sister   . Hypertension Brother   . Heart attack Maternal Grandmother      Current Outpatient Medications:  .  aspirin 81 MG tablet, Take 81 mg by mouth daily., Disp: , Rfl:  .  Cholecalciferol (VITAMIN D-3) 1000 UNITS CAPS, Take 1 capsule by mouth daily., Disp: , Rfl:  .  ferrous sulfate 325 (65 FE) MG tablet, TAKE 1 TABLET (325 MG TOTAL) BY MOUTH 3 (THREE) TIMES DAILY WITH MEALS. (Patient taking differently: Take 325 mg by mouth daily. ), Disp: 270 tablet, Rfl: 1 .  hydrochlorothiazide (MICROZIDE) 12.5 MG capsule, TAKE 1 CAPSULE BY MOUTH EVERY DAY, Disp: 90 capsule, Rfl: 0 .  linaclotide (LINZESS) 72 MCG capsule, Take 1 capsule (72 mcg total) by  mouth daily before breakfast., Disp: 30 capsule, Rfl: 3 .  lisinopril (ZESTRIL) 10 MG tablet, TAKE 1 TABLET BY MOUTH EVERY DAY, Disp: 90 tablet, Rfl: 0 .  oxybutynin (DITROPAN XL) 15 MG 24 hr tablet, Take 15 mg by mouth at bedtime., Disp: , Rfl:  .  pneumococcal 13-valent conjugate vaccine (PREVNAR 13) SUSP injection, Inject 0.5 mLs into the muscle tomorrow at 10 am for 1 dose., Disp: 0.5 mL, Rfl: 0 .  Tdap (BOOSTRIX) 5-2.5-18.5 LF-MCG/0.5 injection, Inject 0.5 mLs into the muscle once for 1 dose., Disp: 0.5 mL, Rfl: 0   No Known Allergies   Review of Systems  On occasion gets a little bleeding with constipation, has internal hemorrhoids.  He could not afford the linzess, but the samples of 72 mg helped. The rest is negative.   Today's Vitals   03/08/19 1146  BP: 128/78  Pulse: 82  Temp: 98.2 F (36.8 C)  TempSrc: Oral  SpO2: 97%  Weight: 251 lb 6.4 oz (114 kg)  Height: 5' 6.6" (1.692 m)   Body mass index is 39.85 kg/m.   Objective:  Physical Exam  Weight is 1 lb up BP 128/78 (BP Location: Left Arm, Patient Position: Sitting, Cuff Size: Normal)   Pulse 82   Temp  98.2 F (36.8 C) (Oral)   Ht 5' 6.6" (1.692 m)   Wt 251 lb 6.4 oz (114 kg)   SpO2 97%   BMI 39.85 kg/m   General Appearance:    Alert, cooperative, no distress, appears stated age  Head:    Normocephalic, without obvious abnormality, atraumatic  Eyes:    PERRL, conjunctiva/corneas clear, EOM's intact, fundi    benign, both eyes       Ears:    Normal TM's and external ear canals, both ears  Nose:   Nares normal, septum midline, mucosa normal, no drainage   or sinus tenderness  Throat:   Lips, mucosa, and tongue normal; teeth and gums normal  Neck:   Supple, symmetrical, trachea midline, no adenopathy;       thyroid:  No enlargement/tenderness/nodules; no carotid   bruit or JVD  Back:     Symmetric, no curvature, ROM normal, no CVA tenderness  Lungs:     Clear to auscultation bilaterally, respirations unlabored   Chest wall:    No tenderness or deformity  Heart:    Regular rate and rhythm, S1 and S2 normal, no murmur, rub   or gallop  Abdomen:     Soft, non-tender, bowel sounds active all four quadrants,    no masses, no organomegaly  Genitalia:    Normal male without lesion, discharge or tenderness  Rectal:    Normal tone, normal prostate, has internal hemorrhoids which were tenderness. Guaiac negative stool  Extremities:   Extremities with few healing scabs on his R shin, no cyanosis or edema. Pedal pulses are hard to palpate.   Pulses:   1+ and symmetric all extremities  Skin:   Skin color, texture, turgor normal, no rashes  Lymph nodes:   Cervical, supraclavicular, and axillary nodes normal  Neurologic:   CNII-XII intact. Normal strength, sensation and reflexes      throughout     EKG- Sinus rhythm. R BBB, unchanged from 03/30/18.  UA neg.  Assessment And Plan:      1. Routine medical exam- routine. FU 1 y - Lipid Profile - CMP14 + Anion Gap - CBC no Diff - TSH - T3, free - T4, Free - EKG 12-Lead  2. Other iron deficiency anemia- has been stable.  - ferrous sulfate 325 (65 FE) MG tablet; One qd  Dispense: 1 tablet; Refill:   3. Screening for prostate cancer- screen - PSA  4. Screen for colon cancer- screen. Negative hemoccult.   5. HYPERTENSION, BENIGN SYSTEMIC- stable. May continue same medication.  - EKG 12-Lead- unchanged. Has apt with his cardiologist this month.  Fu in 3 months.   6. Class 2 severe obesity due to excess calories with serious comorbidity in adult, unspecified BMI ([place])- instructions given explaning needs to loose wt.    Courvoisier Hamblen RODRIGUEZ-SOUTHWORTH, PA-C    THE PATIENT IS ENCOURAGED TO PRACTICE SOCIAL DISTANCING DUE TO THE COVID-19 PANDEMIC.

## 2019-03-08 NOTE — Patient Instructions (Addendum)
Your BMI is 39.85 and normal is 25. Continue working on exercising and reducing your portions to help you loose weight and get to a healthier BMI.    Preventive Care 26 Years and Older, Male Preventive care refers to lifestyle choices and visits with your health care provider that can promote health and wellness. This includes:  A yearly physical exam. This is also called an annual well check.  Regular dental and eye exams.  Immunizations.  Screening for certain conditions.  Healthy lifestyle choices, such as diet and exercise. What can I expect for my preventive care visit? Physical exam Your health care provider will check:  Height and weight. These may be used to calculate body mass index (BMI), which is a measurement that tells if you are at a healthy weight.  Heart rate and blood pressure.  Your skin for abnormal spots. Counseling Your health care provider may ask you questions about:  Alcohol, tobacco, and drug use.  Emotional well-being.  Home and relationship well-being.  Sexual activity.  Eating habits.  History of falls.  Memory and ability to understand (cognition).  Work and work Statistician. What immunizations do I need?  Influenza (flu) vaccine  This is recommended every year. Tetanus, diphtheria, and pertussis (Tdap) vaccine  You may need a Td booster every 10 years. Varicella (chickenpox) vaccine  You may need this vaccine if you have not already been vaccinated. Zoster (shingles) vaccine  You may need this after age 42. Pneumococcal conjugate (PCV13) vaccine  One dose is recommended after age 1. Pneumococcal polysaccharide (PPSV23) vaccine  One dose is recommended after age 6. Measles, mumps, and rubella (MMR) vaccine  You may need at least one dose of MMR if you were born in 1957 or later. You may also need a second dose. Meningococcal conjugate (MenACWY) vaccine  You may need this if you have certain conditions. Hepatitis A  vaccine  You may need this if you have certain conditions or if you travel or work in places where you may be exposed to hepatitis A. Hepatitis B vaccine  You may need this if you have certain conditions or if you travel or work in places where you may be exposed to hepatitis B. Haemophilus influenzae type b (Hib) vaccine  You may need this if you have certain conditions. You may receive vaccines as individual doses or as more than one vaccine together in one shot (combination vaccines). Talk with your health care provider about the risks and benefits of combination vaccines. What tests do I need? Blood tests  Lipid and cholesterol levels. These may be checked every 5 years, or more frequently depending on your overall health.  Hepatitis C test.  Hepatitis B test. Screening  Lung cancer screening. You may have this screening every year starting at age 22 if you have a 30-pack-year history of smoking and currently smoke or have quit within the past 15 years.  Colorectal cancer screening. All adults should have this screening starting at age 36 and continuing until age 32. Your health care provider may recommend screening at age 69 if you are at increased risk. You will have tests every 1-10 years, depending on your results and the type of screening test.  Prostate cancer screening. Recommendations will vary depending on your family history and other risks.  Diabetes screening. This is done by checking your blood sugar (glucose) after you have not eaten for a while (fasting). You may have this done every 1-3 years.  Abdominal aortic aneurysm (  AAA) screening. You may need this if you are a current or former smoker.  Sexually transmitted disease (STD) testing. Follow these instructions at home: Eating and drinking  Eat a diet that includes fresh fruits and vegetables, whole grains, lean protein, and low-fat dairy products. Limit your intake of foods with high amounts of sugar, saturated  fats, and salt.  Take vitamin and mineral supplements as recommended by your health care provider.  Do not drink alcohol if your health care provider tells you not to drink.  If you drink alcohol: ? Limit how much you have to 0-2 drinks a day. ? Be aware of how much alcohol is in your drink. In the [place] one drink equals one 12 oz bottle of beer (355 mL), one 5 oz glass of wine (148 mL), or one 1 oz glass of hard liquor (44 mL). Lifestyle  Take daily care of your teeth and gums.  Stay active. Exercise for at least 30 minutes on 5 or more days each week.  Do not use any products that contain nicotine or tobacco, such as cigarettes, e-cigarettes, and chewing tobacco. If you need help quitting, ask your health care provider.  If you are sexually active, practice safe sex. Use a condom or other form of protection to prevent STIs (sexually transmitted infections).  Talk with your health care provider about taking a low-dose aspirin or statin. What's next?  Visit your health care provider once a year for a well check visit.  Ask your health care provider how often you should have your eyes and teeth checked.  Stay up to date on all vaccines. This information is not intended to replace advice given to you by your health care provider. Make sure you discuss any questions you have with your health care provider. Document Released: 08/29/2015 Document Revised: 07/27/2018 Document Reviewed: 07/27/2018 Elsevier Patient Education  2020 [employer].

## 2019-03-08 NOTE — Progress Notes (Signed)
Subjective:   Kevin Weiss is a 78 y.o. male who presents for Medicare Annual/Subsequent preventive examination.  Review of Systems:  n/a Cardiac Risk Factors include: advanced age (>32men, >31 women);hypertension;male gender;obesity (BMI >30kg/m2)     Objective:    Vitals: BP 128/78 (BP Location: Left Arm, Patient Position: Sitting, Cuff Size: Normal)   Pulse 82   Temp 98.2 F (36.8 C) (Oral)   Ht 5' 6.6" (1.692 m)   Wt 251 lb 6.4 oz (114 kg)   SpO2 97%   BMI 39.85 kg/m   Body mass index is 39.85 kg/m.  Advanced Directives 03/08/2019 05/05/2018 04/11/2018 03/31/2018 03/13/2014 03/01/2014  Does Patient Have a Medical Advance Directive? Yes Yes Yes Yes Patient does not have advance directive Patient does not have advance directive  Type of Advance Directive [place];Living will Living will [place];Living will [place];Living will - -  Does patient want to make changes to medical advance directive? No - Patient declined - No - Patient declined - - -  Copy of [place] in Chart? No - copy requested - - No - copy requested - -    Tobacco Social History   Tobacco Use  Smoking Status Former Smoker  . Packs/day: 0.25  . Years: 10.00  . Pack years: 2.50  Smokeless Tobacco Never Used  Tobacco Comment   40 + years     Counseling given: Not Answered Comment: 40 + years   Clinical Intake:  Pre-visit preparation completed: Yes  Pain : No/denies pain     Nutritional Status: BMI > 30  Obese Nutritional Risks: None Diabetes: No  How often do you need to have someone help you when you read instructions, pamphlets, or other written materials from your doctor or pharmacy?: 1 - Never What is the last grade level you completed in school?: 12th grade  Interpreter Needed?: No  Information entered by :: NAllen LPN  Past Medical History:  Diagnosis Date  . Arthritis   . Cardiomegaly 10/13/2006    Qualifier: Diagnosis of  By: Eusebio Friendly    . CHF, EJECTION FRACTION > OR = 50% 10/13/2006   Qualifier: Diagnosis of  By: Eusebio Friendly    . Degenerative joint disease (DJD) of hip 03/12/2014  . GASTROESOPHAGEAL REFLUX, NO ESOPHAGITIS 10/13/2006   patient denies  . HEMORRHOIDS, NOS 10/13/2006   Qualifier: Diagnosis of  By: Eusebio Friendly    . History of colon polyps   . Hypertension    takes Amlodipine and Prinizide daily  . 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 Morbid obesity ([place]) 11/03/2015  . OSA on CPAP 11/03/2015  . Retrognathia 11/03/2015   Past Surgical History:  Procedure Laterality Date  . COLONOSCOPY    . KNEE ARTHROSCOPY Left 15+yrs ago  . TOTAL HIP ARTHROPLASTY Right 03/12/2014   Procedure: TOTAL HIP ARTHROPLASTY ANTERIOR APPROACH;  Surgeon: Hessie Dibble, MD;  Location: [facility];  Service: Orthopedics;  Laterality: Right;  . TOTAL HIP ARTHROPLASTY Left 04/11/2018   Procedure: TOTAL HIP ARTHROPLASTY ANTERIOR APPROACH;  Surgeon: Melrose Nakayama, MD;  Location: [facility];  Service: Orthopedics;  Laterality: Left;   Family History  Problem Relation Age of Onset  . Hypertension Mother   . Hyperlipidemia Mother   . Alzheimer's disease Mother   . Heart disease Father   . Hyperlipidemia Father   . Hypertension Father   . Hypertension Sister   .  Hypertension Brother   . Heart attack Maternal Grandmother    Social History   Socioeconomic History  . Marital status: Married    Spouse name: Not on file  . Number of children: Not on file  . Years of education: Not on file  . Highest education level: Not on file  Occupational History  . Occupation: retired  Scientific laboratory technician  . Financial resource strain: Not very hard  . Food insecurity    Worry: Never true    Inability: Never true  . Transportation needs    Medical: No    Non-medical: No  Tobacco Use  . Smoking status: Former Smoker    Packs/day: 0.25    Years: 10.00     Pack years: 2.50  . Smokeless tobacco: Never Used  . Tobacco comment: 40 + years  Substance and Sexual Activity  . Alcohol use: Yes    Frequency: Never    Comment: rarely  . Drug use: Not Currently    Types: Marijuana    Comment: 03/30/18  . Sexual activity: Not Currently  Lifestyle  . Physical activity    Days per week: 3 days    Minutes per session: 30 min  . Stress: Not at all  Relationships  . Social Herbalist on phone: Not on file    Gets together: Not on file    Attends religious service: Not on file    Active member of club or organization: Not on file    Attends meetings of clubs or organizations: Not on file    Relationship status: Not on file  Other Topics Concern  . Not on file  Social History Narrative   Live at home with wife. Retired from [employer] as a Librarian, academic.     Outpatient Encounter Medications as of 03/08/2019  Medication Sig  . aspirin 81 MG tablet Take 81 mg by mouth daily.  . Cholecalciferol (VITAMIN D-3) 1000 UNITS CAPS Take 1 capsule by mouth daily.  . ferrous sulfate 325 (65 FE) MG tablet TAKE 1 TABLET (325 MG TOTAL) BY MOUTH 3 (THREE) TIMES DAILY WITH MEALS. (Patient taking differently: Take 325 mg by mouth daily. )  . hydrochlorothiazide (MICROZIDE) 12.5 MG capsule TAKE 1 CAPSULE BY MOUTH EVERY DAY  . linaclotide (LINZESS) 72 MCG capsule Take 1 capsule (72 mcg total) by mouth daily before breakfast.  . lisinopril (ZESTRIL) 10 MG tablet TAKE 1 TABLET BY MOUTH EVERY DAY  . oxybutynin (DITROPAN XL) 15 MG 24 hr tablet Take 15 mg by mouth at bedtime.  . pneumococcal 13-valent conjugate vaccine (PREVNAR 13) SUSP injection Inject 0.5 mLs into the muscle tomorrow at 10 am for 1 dose.  . Tdap (BOOSTRIX) 5-2.5-18.5 LF-MCG/0.5 injection Inject 0.5 mLs into the muscle once for 1 dose.   No facility-administered encounter medications on file as of 03/08/2019.     Activities of Daily Living In your present state of  health, do you have any difficulty performing the following activities: 03/08/2019 04/11/2018  Hearing? Y N  Comment slight hearing loss in right ear -  Vision? N N  Difficulty concentrating or making decisions? N N  Walking or climbing stairs? N Y  Dressing or bathing? N N  Doing errands, shopping? N N  Preparing Food and eating ? N -  Using the Toilet? N -  In the past six months, have you accidently leaked urine? N -  Do you have problems with loss of bowel control? N -  Managing your Medications? N -  Managing your Finances? N -  Housekeeping or managing your Housekeeping? N -  Some recent data might be hidden    Patient Care Team: Rodriguez-Southworth, Sandrea Matte as PCP - General (Internal Medicine)   Assessment:   This is a routine wellness examination for [place].  Exercise Activities and Dietary recommendations Current Exercise Habits: Home exercise routine, Type of exercise: treadmill, Time (Minutes): 30, Frequency (Times/Week): 3, Weekly Exercise (Minutes/Week): 90  Goals    . Weight (lb) < 200 lb (90.7 kg)     03/08/2019 wants to get down to 220 pounds       Fall Risk Fall Risk  03/08/2019 01/23/2019 08/15/2018  Falls in the past year? 0 0 0  Number falls in past yr: 0 - -  Risk for fall due to : Medication side effect - -  Follow up Falls evaluation completed;Education provided;Falls prevention discussed - -   Is the patient's home free of loose throw rugs in walkways, pet beds, electrical cords, etc?   yes      Grab bars in the bathroom? no      Handrails on the stairs?   yes      Adequate lighting?   yes  Timed Get Up and Go Performed: n/a  Depression Screen PHQ 2/9 Scores 03/08/2019 01/23/2019 08/15/2018  PHQ - 2 Score 0 0 0  PHQ- 9 Score 0 - -    Cognitive Function     6CIT Screen 03/08/2019  What Year? 0 points  What month? 0 points  What time? 0 points  Count back from 20 0 points  Months in reverse 0 points  Repeat phrase 0 points  Total Score  0    Immunization History  Administered Date(s) Administered  . Influenza-Unspecified 03/16/2014  . Pneumococcal Polysaccharide-23 03/14/2014, 04/13/2018  . Td 08/16/1996    Qualifies for Shingles Vaccine? yes  Screening Tests Health Maintenance  Topic Date Due  . TETANUS/TDAP  08/16/2006  . INFLUENZA VACCINE  03/17/2019  . PNA vac Low Risk Adult (2 of 2 - PCV13) 04/14/2019  . COLONOSCOPY  08/23/2028   Cancer Screenings: Lung: Low Dose CT Chest recommended if Age 12-80 years, 30 pack-year currently smoking OR have quit w/in 15years. Patient does not qualify. Colorectal: 08/2018  Additional Screenings:  Hepatitis C Screening:n/a      Plan:    6 Cit was 0. Patient wants to weigh 220 pounds.  I have personally reviewed and noted the following in the patient's chart:   . Medical and social history . Use of alcohol, tobacco or illicit drugs  . Current medications and supplements . Functional ability and status . Nutritional status . Physical activity . Advanced directives . List of other physicians . Hospitalizations, surgeries, and ER visits in previous 12 months . Vitals . Screenings to include cognitive, depression, and falls . Referrals and appointments  In addition, I have reviewed and discussed with patient certain preventive protocols, quality metrics, and best practice recommendations. A written personalized care plan for preventive services as well as general preventive health recommendations were provided to patient.     Kellie Simmering, LPN  03/25/1750

## 2019-03-08 NOTE — Patient Instructions (Signed)
[name] , Thank you for taking time to come for your Medicare Wellness Visit. I appreciate your ongoing commitment to your health goals. Please review the following plan we discussed and let me know if I can assist you in the future.   Screening recommendations/referrals: Colonoscopy: 08/2018 Recommended yearly ophthalmology/optometry visit for glaucoma screening and checkup Recommended yearly dental visit for hygiene and checkup  Vaccinations: Influenza vaccine: 03/2014 Pneumococcal vaccine: 03/2018 Tdap vaccine: sent to pharmacy Shingles vaccine: discussed    Advanced directives: Please bring a copy of your POA (Power of [place]) and/or Living Will to your next appointment.    Conditions/risks identified: obese  Next appointment: 06/14/2019 at 11:15  Preventive Care 76 Years and Older, Male Preventive care refers to lifestyle choices and visits with your health care provider that can promote health and wellness. What does preventive care include?  A yearly physical exam. This is also called an annual well check.  Dental exams once or twice a year.  Routine eye exams. Ask your health care provider how often you should have your eyes checked.  Personal lifestyle choices, including:  Daily care of your teeth and gums.  Regular physical activity.  Eating a healthy diet.  Avoiding tobacco and drug use.  Limiting alcohol use.  Practicing safe sex.  Taking low doses of aspirin every day.  Taking vitamin and mineral supplements as recommended by your health care provider. What happens during an annual well check? The services and screenings done by your health care provider during your annual well check will depend on your age, overall health, lifestyle risk factors, and family history of disease. Counseling  Your health care provider may ask you questions about your:  Alcohol use.  Tobacco use.  Drug use.  Emotional well-being.  Home and relationship  well-being.  Sexual activity.  Eating habits.  History of falls.  Memory and ability to understand (cognition).  Work and work Statistician. Screening  You may have the following tests or measurements:  Height, weight, and BMI.  Blood pressure.  Lipid and cholesterol levels. These may be checked every 5 years, or more frequently if you are over 41 years old.  Skin check.  Lung cancer screening. You may have this screening every year starting at age 76 if you have a 30-pack-year history of smoking and currently smoke or have quit within the past 15 years.  Fecal occult blood test (FOBT) of the stool. You may have this test every year starting at age 76.  Flexible sigmoidoscopy or colonoscopy. You may have a sigmoidoscopy every 5 years or a colonoscopy every 10 years starting at age 76.  Prostate cancer screening. Recommendations will vary depending on your family history and other risks.  Hepatitis C blood test.  Hepatitis B blood test.  Sexually transmitted disease (STD) testing.  Diabetes screening. This is done by checking your blood sugar (glucose) after you have not eaten for a while (fasting). You may have this done every 1-3 years.  Abdominal aortic aneurysm (AAA) screening. You may need this if you are a current or former smoker.  Osteoporosis. You may be screened starting at age 76 if you are at high risk. Talk with your health care provider about your test results, treatment options, and if necessary, the need for more tests. Vaccines  Your health care provider may recommend certain vaccines, such as:  Influenza vaccine. This is recommended every year.  Tetanus, diphtheria, and acellular pertussis (Tdap, Td) vaccine. You may need a Td  booster every 10 years.  Zoster vaccine. You may need this after age 62.  Pneumococcal 13-valent conjugate (PCV13) vaccine. One dose is recommended after age 76.  Pneumococcal polysaccharide (PPSV23) vaccine. One dose is  recommended after age 76. Talk to your health care provider about which screenings and vaccines you need and how often you need them. This information is not intended to replace advice given to you by your health care provider. Make sure you discuss any questions you have with your health care provider. Document Released: 08/29/2015 Document Revised: 04/21/2016 Document Reviewed: 06/03/2015 Elsevier Interactive Patient Education  2017 [place] Prevention in the Home Falls can cause injuries. They can happen to people of all ages. There are many things you can do to make your home safe and to help prevent falls. What can I do on the outside of my home?  Regularly fix the edges of walkways and driveways and fix any cracks.  Remove anything that might make you trip as you walk through a door, such as a raised step or threshold.  Trim any bushes or trees on the path to your home.  Use bright outdoor lighting.  Clear any walking paths of anything that might make someone trip, such as rocks or tools.  Regularly check to see if handrails are loose or broken. Make sure that both sides of any steps have handrails.  Any raised decks and porches should have guardrails on the edges.  Have any leaves, snow, or ice cleared regularly.  Use sand or salt on walking paths during winter.  Clean up any spills in your garage right away. This includes oil or grease spills. What can I do in the bathroom?  Use night lights.  Install grab bars by the toilet and in the tub and shower. Do not use towel bars as grab bars.  Use non-skid mats or decals in the tub or shower.  If you need to sit down in the shower, use a plastic, non-slip stool.  Keep the floor dry. Clean up any water that spills on the floor as soon as it happens.  Remove soap buildup in the tub or shower regularly.  Attach bath mats securely with double-sided non-slip rug tape.  Do not have throw rugs and other things on  the floor that can make you trip. What can I do in the bedroom?  Use night lights.  Make sure that you have a light by your bed that is easy to reach.  Do not use any sheets or blankets that are too big for your bed. They should not hang down onto the floor.  Have a firm chair that has side arms. You can use this for support while you get dressed.  Do not have throw rugs and other things on the floor that can make you trip. What can I do in the kitchen?  Clean up any spills right away.  Avoid walking on wet floors.  Keep items that you use a lot in easy-to-reach places.  If you need to reach something above you, use a strong step stool that has a grab bar.  Keep electrical cords out of the way.  Do not use floor polish or wax that makes floors slippery. If you must use wax, use non-skid floor wax.  Do not have throw rugs and other things on the floor that can make you trip. What can I do with my stairs?  Do not leave any items on the stairs.  Make sure that there are handrails on both sides of the stairs and use them. Fix handrails that are broken or loose. Make sure that handrails are as long as the stairways.  Check any carpeting to make sure that it is firmly attached to the stairs. Fix any carpet that is loose or worn.  Avoid having throw rugs at the top or bottom of the stairs. If you do have throw rugs, attach them to the floor with carpet tape.  Make sure that you have a light switch at the top of the stairs and the bottom of the stairs. If you do not have them, ask someone to add them for you. What else can I do to help prevent falls?  Wear shoes that:  Do not have high heels.  Have rubber bottoms.  Are comfortable and fit you well.  Are closed at the toe. Do not wear sandals.  If you use a stepladder:  Make sure that it is fully opened. Do not climb a closed stepladder.  Make sure that both sides of the stepladder are locked into place.  Ask someone to  hold it for you, if possible.  Clearly mark and make sure that you can see:  Any grab bars or handrails.  First and last steps.  Where the edge of each step is.  Use tools that help you move around (mobility aids) if they are needed. These include:  Canes.  Walkers.  Scooters.  Crutches.  Turn on the lights when you go into a dark area. Replace any light bulbs as soon as they burn out.  Set up your furniture so you have a clear path. Avoid moving your furniture around.  If any of your floors are uneven, fix them.  If there are any pets around you, be aware of where they are.  Review your medicines with your doctor. Some medicines can make you feel dizzy. This can increase your chance of falling. Ask your doctor what other things that you can do to help prevent falls. This information is not intended to replace advice given to you by your health care provider. Make sure you discuss any questions you have with your health care provider. Document Released: 05/29/2009 Document Revised: 01/08/2016 Document Reviewed: 09/06/2014 Elsevier Interactive Patient Education  2017 [employer].

## 2019-03-10 DIAGNOSIS — G4733 Obstructive sleep apnea (adult) (pediatric): Secondary | ICD-10-CM | POA: Diagnosis not present

## 2019-04-11 DIAGNOSIS — G4733 Obstructive sleep apnea (adult) (pediatric): Secondary | ICD-10-CM | POA: Diagnosis not present

## 2019-05-05 ENCOUNTER — Other Ambulatory Visit: Payer: Self-pay | Admitting: Nurse Practitioner

## 2019-05-11 DIAGNOSIS — G4733 Obstructive sleep apnea (adult) (pediatric): Secondary | ICD-10-CM | POA: Diagnosis not present

## 2019-05-12 NOTE — Progress Notes (Signed)
Cardiology Office Note   Date:  05/14/2019   ID:  Kevin Weiss, DOB April 02, 1941, MRN ID:[id number]  PCP:  Shelby Mattocks, PA-C  Cardiologist:   No primary care provider on file.   Chief Complaint  Patient presents with  . Abnormal ECG      History of Present Illness: Kevin Weiss is a 78 y.o. male who was seen previously for evaluation of chest pain.  Since I last saw him he is not had any of this.  He does a lot of yard work.  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  He runs a Personnel officer. The patient denies any new symptoms such as chest discomfort, neck or arm discomfort. There has been no new shortness of breath, PND or orthopnea. There have been no reported palpitations, presyncope or syncope.    Past Medical History:  Diagnosis Date  . Arthritis   . Cardiomegaly 10/13/2006   Qualifier: Diagnosis of  By: Eusebio Friendly    . CHF, EJECTION FRACTION > OR = 50% 10/13/2006   Qualifier: Diagnosis of  By: Eusebio Friendly    . Degenerative joint disease (DJD) of hip 03/12/2014  . GASTROESOPHAGEAL REFLUX, NO ESOPHAGITIS 10/13/2006   patient denies  . HEMORRHOIDS, NOS 10/13/2006   Qualifier: Diagnosis of  By: Eusebio Friendly    . History of colon polyps   . Hypertension    takes Amlodipine and Prinizide daily  . 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 Morbid obesity ([place]) 11/03/2015  . OSA on CPAP 11/03/2015  . Retrognathia 11/03/2015    Past Surgical History:  Procedure Laterality Date  . COLONOSCOPY    . KNEE ARTHROSCOPY Left 15+yrs ago  . TOTAL HIP ARTHROPLASTY Right 03/12/2014   Procedure: TOTAL HIP ARTHROPLASTY ANTERIOR APPROACH;  Surgeon: Hessie Dibble, MD;  Location: [facility];  Service: Orthopedics;  Laterality: Right;  . TOTAL HIP ARTHROPLASTY Left 04/11/2018   Procedure: TOTAL HIP ARTHROPLASTY ANTERIOR APPROACH;  Surgeon: Melrose Nakayama, MD;  Location: [facility];  Service: Orthopedics;  Laterality: Left;     Current  Outpatient Medications  Medication Sig Dispense Refill  . aspirin 81 MG tablet Take 81 mg by mouth daily.    . Cholecalciferol (VITAMIN D-3) 1000 UNITS CAPS Take 1 capsule by mouth daily.    . ferrous sulfate 325 (65 FE) MG tablet One qd 1 tablet 0  . hydrochlorothiazide (MICROZIDE) 12.5 MG capsule TAKE 1 CAPSULE BY MOUTH EVERY DAY 90 capsule 0  . linaclotide (LINZESS) 72 MCG capsule Take 1 capsule (72 mcg total) by mouth daily before breakfast. (Patient taking differently: Take 72 mcg by mouth as needed. ) 30 capsule 3  . lisinopril (ZESTRIL) 10 MG tablet TAKE 1 TABLET BY MOUTH EVERY DAY 90 tablet 0  . oxybutynin (DITROPAN XL) 15 MG 24 hr tablet Take 15 mg by mouth at bedtime.     No current facility-administered medications for this visit.     Allergies:   Patient has no known allergies.    ROS:  Please see the history of present illness.   Otherwise, review of systems are positive for none.   All other systems are reviewed and negative.    PHYSICAL EXAM: VS:  BP 122/80   Pulse 80   Ht 5\' 7"  (1.702 m)   Wt 246 lb (111.6 kg)   BMI 38.53 kg/m  , BMI Body mass index is 38.53 kg/m. GENERAL:  Well appearing NECK:  No  jugular venous distention, waveform within normal limits, carotid upstroke brisk and symmetric, no bruits, no thyromegaly LUNGS:  Clear to auscultation bilaterally CHEST:  Unremarkable HEART:  PMI not displaced or sustained,S1 and S2 within normal limits, no S3, no S4, no clicks, no rubs, no murmurs ABD:  Flat, positive bowel sounds normal in frequency in pitch, no bruits, no rebound, no guarding, no midline pulsatile mass, no hepatomegaly, no splenomegaly EXT:  2 plus pulses throughout, no edema, no cyanosis no clubbing   EKG:  EKG is not ordered today. EKG 03/27/2019 sinus rhythm, right bundle branch block, short PR interval, left axis deviation, left anterior fascicular block  Recent Labs: 01/23/2019: Hemoglobin 15.9; Platelets 228    Lipid Panel    Component  Value Date/Time   CHOL 159 08/22/2018 0927   TRIG 63 08/22/2018 0927   HDL 40 08/22/2018 0927   CHOLHDL 4.0 08/22/2018 0927   LDLCALC 106 (H) 08/22/2018 0927      Wt Readings from Last 3 Encounters:  05/14/19 246 lb (111.6 kg)  03/08/19 251 lb 6.4 oz (114 kg)  03/08/19 251 lb 6.4 oz (114 kg)      Other studies Reviewed: Additional studies/ records that were reviewed today include: None     ASSESSMENT AND PLAN:   ABNORMAL EKG :  The patient does have conduction disturbance as above but has had no symptoms.  We talked about symptoms that could occur if this progresses and he would let me know.   HYPERTENSION:   The blood pressure is at target.  No change in therapy.   OBESITY: He has lost a little weight during the pandemic which is laudable.  I encouraged more of the same.  Current medicines are reviewed at length with the patient today.  The patient does not have concerns regarding medicines.  The following changes have been made:  None  Labs/ tests ordered today include: None  No orders of the defined types were placed in this encounter.    Disposition:   FU with me in 24 months .    Signed, Minus Breeding, MD  05/14/2019 12:54 PM    [place]

## 2019-05-14 ENCOUNTER — Other Ambulatory Visit: Payer: Self-pay

## 2019-05-14 ENCOUNTER — Ambulatory Visit (INDEPENDENT_AMBULATORY_CARE_PROVIDER_SITE_OTHER): Payer: Medicare HMO | Admitting: Cardiology

## 2019-05-14 ENCOUNTER — Encounter: Payer: Self-pay | Admitting: Cardiology

## 2019-05-14 ENCOUNTER — Telehealth: Payer: Self-pay

## 2019-05-14 VITALS — BP 122/80 | HR 80 | Ht 67.0 in | Wt 246.0 lb

## 2019-05-14 DIAGNOSIS — R079 Chest pain, unspecified: Secondary | ICD-10-CM

## 2019-05-14 DIAGNOSIS — I10 Essential (primary) hypertension: Secondary | ICD-10-CM

## 2019-05-14 DIAGNOSIS — R0789 Other chest pain: Secondary | ICD-10-CM

## 2019-05-14 NOTE — Telephone Encounter (Signed)
Returned pt call about his lab results, not sure why labs were not collected at his last appt, pt will need to come in to have labs done pt stated he will come by today to have labs done

## 2019-05-14 NOTE — Telephone Encounter (Signed)
LVM to inform him that he needs to be fasting for his lab work, if he is not fasting he would need to reschedule his lab appt

## 2019-05-14 NOTE — Patient Instructions (Signed)
Medication Instructions:  Your physician recommends that you continue on your current medications as directed. Please refer to the Current Medication list given to you today.  If you need a refill on your cardiac medications before your next appointment, please call your pharmacy.   Lab work: NONE  Testing/Procedures: NONE  Follow-Up: At [facility], you and your health needs are our priority.  As part of our continuing mission to provide you with exceptional heart care, we have created designated Provider Care Teams.  These Care Teams include your primary Cardiologist (physician) and Advanced Practice Providers (APPs -  Physician Assistants and Nurse Practitioners) who all work together to provide you with the care you need, when you need it. You will need a follow up appointment in 2 years.  Please call our office 2 months in advance to schedule this appointment.  You may see Dr. Percival Spanish or one of the following Advanced Practice Providers on your designated Care Team:   Rosaria Ferries, PA-C Jory Sims, DNP, ANP

## 2019-05-15 ENCOUNTER — Other Ambulatory Visit: Payer: Medicare HMO

## 2019-05-15 DIAGNOSIS — Z125 Encounter for screening for malignant neoplasm of prostate: Secondary | ICD-10-CM | POA: Diagnosis not present

## 2019-05-15 DIAGNOSIS — Z0000 Encounter for general adult medical examination without abnormal findings: Secondary | ICD-10-CM | POA: Diagnosis not present

## 2019-05-16 LAB — CMP14 + ANION GAP
ALT: 22 IU/L (ref 0–44)
AST: 22 IU/L (ref 0–40)
Albumin/Globulin Ratio: 1.8 (ref 1.2–2.2)
Albumin: 4.6 g/dL (ref 3.7–4.7)
Alkaline Phosphatase: 215 IU/L — ABNORMAL HIGH (ref 39–117)
Anion Gap: 17 mmol/L (ref 10.0–18.0)
BUN/Creatinine Ratio: 15 (ref 10–24)
BUN: 17 mg/dL (ref 8–27)
Bilirubin Total: 0.5 mg/dL (ref 0.0–1.2)
CO2: 22 mmol/L (ref 20–29)
Calcium: 9.7 mg/dL (ref 8.6–10.2)
Chloride: 99 mmol/L (ref 96–106)
Creatinine, Ser: 1.12 mg/dL (ref 0.76–1.27)
GFR calc Af Amer: 74 mL/min/{1.73_m2} (ref >59)
GFR calc non Af Amer: 64 mL/min/{1.73_m2} (ref >59)
Globulin, Total: 2.6 g/dL (ref 1.5–4.5)
Glucose: 89 mg/dL (ref 65–99)
Potassium: 4.6 mmol/L (ref 3.5–5.2)
Sodium: 138 mmol/L (ref 134–144)
Total Protein: 7.2 g/dL (ref 6.0–8.5)

## 2019-05-16 LAB — PSA: Prostate Specific Ag, Serum: 3 ng/mL (ref 0.0–4.0)

## 2019-05-16 LAB — LIPID PANEL
Chol/HDL Ratio: 4.5 ratio (ref 0.0–5.0)
Cholesterol, Total: 180 mg/dL (ref 100–199)
HDL: 40 mg/dL (ref >39)
LDL Chol Calc (NIH): 127 mg/dL — ABNORMAL HIGH (ref 0–99)
Triglycerides: 69 mg/dL (ref 0–149)
VLDL Cholesterol Cal: 13 mg/dL (ref 5–40)

## 2019-05-16 LAB — CBC
Hematocrit: 47.2 % (ref 37.5–51.0)
Hemoglobin: 15.7 g/dL (ref 13.0–17.7)
MCH: 28.7 pg (ref 26.6–33.0)
MCHC: 33.3 g/dL (ref 31.5–35.7)
MCV: 86 fL (ref 79–97)
Platelets: 198 10*3/uL (ref 150–450)
RBC: 5.47 x10E6/uL (ref 4.14–5.80)
RDW: 13.8 % (ref 11.6–15.4)
WBC: 4.8 10*3/uL (ref 3.4–10.8)

## 2019-05-16 LAB — TSH: TSH: 1.86 u[IU]/mL (ref 0.450–4.500)

## 2019-05-16 LAB — T4, FREE: Free T4: 1.26 ng/dL (ref 0.82–1.77)

## 2019-05-16 LAB — T3, FREE: T3, Free: 3.2 pg/mL (ref 2.0–4.4)

## 2019-05-17 ENCOUNTER — Telehealth: Payer: Self-pay

## 2019-05-17 NOTE — Telephone Encounter (Signed)
Spoke w/patient per Shelby Mattocks, Sunday Spillers, PA-C  Candiss Norse T, CMA        Please inform pt that his cholesterol panel shows the bad cholesterol LDL to be a little elevated and increasing fiber in his diet, exercising and eating more onions, garlic, oats and flax seed will help bring this down. In diabetics and prediabetics it is recommended that the LDL is 70 or less to prevent cardiovascular events. Your cell count, prostate cancer screen and cell count is normal.

## 2019-05-22 ENCOUNTER — Telehealth: Payer: Self-pay

## 2019-05-22 NOTE — Telephone Encounter (Signed)
Pt called he is taking Garlic tabs, and flaxseed tabs OTC. Pt wanted to know if the OTC medication is ok to take with the rest of the meds that he is already taking? Also do you think them tabs will help him?

## 2019-05-24 ENCOUNTER — Telehealth: Payer: Self-pay

## 2019-05-24 ENCOUNTER — Other Ambulatory Visit: Payer: Self-pay | Admitting: Internal Medicine

## 2019-05-24 ENCOUNTER — Other Ambulatory Visit: Payer: Self-pay

## 2019-05-24 ENCOUNTER — Encounter: Payer: Self-pay | Admitting: Internal Medicine

## 2019-05-24 DIAGNOSIS — K5900 Constipation, unspecified: Secondary | ICD-10-CM

## 2019-05-24 DIAGNOSIS — I10 Essential (primary) hypertension: Secondary | ICD-10-CM

## 2019-05-24 DIAGNOSIS — R7303 Prediabetes: Secondary | ICD-10-CM

## 2019-05-24 NOTE — Progress Notes (Signed)
HGBA1C ordered.

## 2019-05-24 NOTE — Telephone Encounter (Signed)
Please tell him, yes he may take them with current medications. If he has been taking the garlic pills since June last year, it is not helping his bad cholesterol since it was 107 last year, and now is 127. Flax seed in the raw is always better than in a pill, and so is garlic. For medicinal purposes to help with health, garlic should be eaten fresh or if cooked added at the end of fixing a meal, since is loosing is good benefits when cooked a long time. Flax seed or in powder form can be added to oat meal, smoothies, salads, etc. So he may try it this way the next 6 months and we can see if his numbers get better.  Also he had prediabetic numbers last year, and I did not see a DM test on his last labs, so I will add this and he can come any time next week to have it done. He was at 5.9 and normal is 5.6. Diagnosis of diabetes is at 7 or more.

## 2019-05-24 NOTE — Telephone Encounter (Signed)
Spoke with patient he states that he just started taking the Garlic pills, and Flaxseed pills. He wanted to wait to hear back from you before he continued taking them. He also stated that his garlic pills says 1x a day, and his flaxseed pills says 3xs a day. He wants to know is it ok if he just takes the flaxseed pills 1x a day instead of 3xs a day?

## 2019-05-24 NOTE — Telephone Encounter (Signed)
Called pt back to give him Sunday Spillers answer to his question down below Pt also stated that his medication Liness cost a lot and maybe he can have something a little cheaper. Sent in a referral to CCM to see if they can assist him with the cost of his medication   Spoke with patient he states that he just started taking the Garlic pills, and Flaxseed pills. He wanted to wait to hear back from you before he continued taking them. He also stated that his garlic pills says 1x a day, and his flaxseed pills says 3xs a day. He wants to know is it ok if he just takes the flaxseed pills 1x a day instead of 3xs a day?  Yes, one a day of both is fine for now.

## 2019-05-24 NOTE — Telephone Encounter (Signed)
Yes, one a day of both is fine for now.

## 2019-05-27 ENCOUNTER — Other Ambulatory Visit: Payer: Self-pay | Admitting: Internal Medicine

## 2019-05-27 DIAGNOSIS — I10 Essential (primary) hypertension: Secondary | ICD-10-CM

## 2019-05-31 ENCOUNTER — Ambulatory Visit (INDEPENDENT_AMBULATORY_CARE_PROVIDER_SITE_OTHER): Payer: Medicare HMO

## 2019-05-31 ENCOUNTER — Ambulatory Visit: Payer: Self-pay

## 2019-05-31 DIAGNOSIS — N5201 Erectile dysfunction due to arterial insufficiency: Secondary | ICD-10-CM | POA: Diagnosis not present

## 2019-05-31 DIAGNOSIS — E782 Mixed hyperlipidemia: Secondary | ICD-10-CM | POA: Diagnosis not present

## 2019-05-31 DIAGNOSIS — R7303 Prediabetes: Secondary | ICD-10-CM

## 2019-05-31 DIAGNOSIS — R350 Frequency of micturition: Secondary | ICD-10-CM | POA: Diagnosis not present

## 2019-05-31 DIAGNOSIS — I10 Essential (primary) hypertension: Secondary | ICD-10-CM | POA: Diagnosis not present

## 2019-05-31 NOTE — Chronic Care Management (AMB) (Signed)
Chronic Care Management  Social Work General Note  05/31/2019 Name: Kevin Weiss MRN: [id number] DOB: 1940/12/02  Kevin Weiss is a 78 y.o. year old male who is a primary care patient of Kevin Weiss, [place]. The CCM was consulted to assist the patient with care coordination.  Kevin Weiss was given information about Chronic Care Management services today including:  1. CCM service includes personalized support from designated clinical staff supervised by his physician, including individualized plan of care and coordination with other care providers 2. 24/7 contact phone numbers for assistance for urgent and routine care needs. 3. Service will only be billed when office clinical staff spend 20 minutes or more in a month to coordinate care. 4. Only one practitioner may furnish and bill the service in a calendar month. 5. The patient may stop CCM services at any time (effective at the end of the month) by phone call to the office staff. 6. The patient will be responsible for cost sharing (co-pay) of up to 20% of the service fee (after annual deductible is met).  Patient agreed to services and verbal consent obtained.   Review of patient status, including review of consultants reports, relevant laboratory and other test results, and collaboration with appropriate care team members and the patient's provider was performed as part of comprehensive patient evaluation and provision of chronic care management services.    SDOH (Social Determinants of Health) screening performed today. See Care Plan Entry related to challenges with: None   Outpatient Encounter Medications as of 05/31/2019  Medication Sig  . aspirin 81 MG tablet Take 81 mg by mouth daily.  . Cholecalciferol (VITAMIN D-3) 1000 UNITS CAPS Take 1 capsule by mouth daily.  . ferrous sulfate 325 (65 FE) MG tablet One qd  . hydrochlorothiazide (MICROZIDE) 12.5 MG capsule TAKE 1 CAPSULE BY MOUTH EVERY DAY  .  linaclotide (LINZESS) 72 MCG capsule Take 1 capsule (72 mcg total) by mouth daily before breakfast. (Patient taking differently: Take 72 mcg by mouth as needed. )  . lisinopril (ZESTRIL) 10 MG tablet TAKE 1 TABLET BY MOUTH EVERY DAY  . oxybutynin (DITROPAN XL) 15 MG 24 hr tablet Take 15 mg by mouth at bedtime.   No facility-administered encounter medications on file as of 05/31/2019.     Goals Addressed            This Visit's Progress   . Collaborate with RN Case Manager to perform appropriate assessments to determine care management and care coordination needs       Current Barriers:  . Care coordination needs related to independent self-health management of chronic conditions including HTN and Pre Diabetes  Clinical Social Work Clinical Goal(s):  Marland Kitchen Over the next 30 days the patient will work with RN Case Manager to establish an individualized plan of care related to the management of patients identified chronic conditions . Over the next 30 days the patient will work with embedded PharmD to address patients identified concerns surrounding cost on Linzess  Interventions: . Patient interviewed and appropriate assessments performed . Assessed understanding of current chronic medical conditions. The patient identifies as having high blood pressure  . Determined the patient feels his HTN is well controlled as evidenced by stable readings during physician appointments . Advised the patient to expect a call in the coming month from RN Case Manager whom will assist with disease management . Assessed for patient ability to afford prescribed medications. The patients identifies difficulty affording Linzess . Advised the patient  to expect a call from embedded PharmD for med assistance . Collaboration with CM team regarding patient enrollment and identified case management needs  Patient Self Care Activities:  . Self administers medications as prescribed . Attends all scheduled provider  appointments . Performs ADL's independently . Calls provider office for new concerns or questions  Initial goal documentation         Follow Up Plan: No planned SW follow up at this time. The patient will be contacted by embedded PharmD and RN Case Manager over the next 28 days.       Daneen Schick, BSW, CDP Social Worker, Certified Dementia Practitioner [place] [facility] [phone]  Total time spent performing care coordination and/or care management activities with the patient by phone or face to face = 12 minutes.

## 2019-05-31 NOTE — Chronic Care Management (AMB) (Signed)
  Chronic Care Management   Initial Visit Note  05/31/2019 Name: Kevin Weiss MRN: [id number] DOB: 10-Jul-1941  Referred by: Kevin Mattocks, PA-C Weiss for referral : Chronic Care Management (CCM RNCM Case Collaboration )   Kevin Weiss is a 78 y.o. year old male who is a primary care patient of Kevin Weiss, Kevin Weiss, [place]. The care management team was consulted for assistance with chronic disease management and care coordination needs related to HTN.  Review of patient status, including review of consultants reports, relevant laboratory and other test results, and collaboration with appropriate care team members and the patient's provider was performed as part of comprehensive patient evaluation and provision of chronic care management services.    I initiated and established the plan of care for Kevin Weiss during one on one collaboration with my clinical care management colleague Kevin Weiss BSW who is also engaged with this patient to address social work needs.   Outpatient Encounter Medications as of 05/31/2019  Medication Sig  . aspirin 81 MG tablet Take 81 mg by mouth daily.  . Cholecalciferol (VITAMIN D-3) 1000 UNITS CAPS Take 1 capsule by mouth daily.  . ferrous sulfate 325 (65 FE) MG tablet One qd  . hydrochlorothiazide (MICROZIDE) 12.5 MG capsule TAKE 1 CAPSULE BY MOUTH EVERY DAY  . linaclotide (LINZESS) 72 MCG capsule Take 1 capsule (72 mcg total) by mouth daily before breakfast. (Patient taking differently: Take 72 mcg by mouth as needed. )  . lisinopril (ZESTRIL) 10 MG tablet TAKE 1 TABLET BY MOUTH EVERY DAY  . oxybutynin (DITROPAN XL) 15 MG 24 hr tablet Take 15 mg by mouth at bedtime.   No facility-administered encounter medications on file as of 05/31/2019.      Goals Addressed    . Assist with Chronic Care Management and Care Coordination needs       Current Barriers:  Marland Kitchen Knowledge Barriers related to resources and support  available to address needs related to Chronic disease management and Pharmacy resource needs  Case Manager Clinical Goal(s):  Marland Kitchen Over the next 30 days, patient will work with the CCM team to address needs related to Chronic disease Management and Pharmacy resource needs  Interventions:  . Collaborated with BSW and initiated plan of care to address needs related to Pharmacy resource needs; assess for CCM RNCM needs  Patient Self Care Activities:  . Attends all scheduled provider appointments . Calls provider office for new concerns or questions  Initial goal documentation         Telephone follow up appointment with care management team member scheduled for: 06/22/19  Barb Merino, RN, BSN, CCM Care Management Coordinator [facility]  Direct Phone: [phone]

## 2019-05-31 NOTE — Patient Instructions (Signed)
Social Worker Visit Information  Goals we discussed today:  Goals Addressed            This Visit's Progress   . Collaborate with RN Case Manager to perform appropriate assessments to determine care management and care coordination needs       Current Barriers:  . Care coordination needs related to independent self-health management of chronic conditions including HTN and Pre Diabetes  Clinical Social Work Clinical Goal(s):  Marland Kitchen Over the next 30 days the patient will work with RN Case Manager to establish an individualized plan of care related to the management of patients identified chronic conditions . Over the next 30 days the patient will work with embedded PharmD to address patients identified concerns surrounding cost on Linzess  Interventions: . Patient interviewed and appropriate assessments performed . Assessed understanding of current chronic medical conditions. The patient identifies as having high blood pressure  . Determined the patient feels his HTN is well controlled as evidenced by stable readings during physician appointments . Advised the patient to expect a call in the coming month from RN Case Manager whom will assist with disease management . Assessed for patient ability to afford prescribed medications. The patients identifies difficulty affording Linzess . Advised the patient to expect a call from embedded PharmD for med assistance . Collaboration with CM team regarding patient enrollment and identified case management needs  Patient Self Care Activities:  . Self administers medications as prescribed . Attends all scheduled provider appointments . Performs ADL's independently . Calls provider office for new concerns or questions  Initial goal documentation         Materials provided: Verbal education about CM program provided by phone  Mr. Niehoff was given information about Chronic Care Management services today including:  1. CCM service includes  personalized support from designated clinical staff supervised by his physician, including individualized plan of care and coordination with other care providers 2. 24/7 contact phone numbers for assistance for urgent and routine care needs. 3. Service will only be billed when office clinical staff spend 20 minutes or more in a month to coordinate care. 4. Only one practitioner may furnish and bill the service in a calendar month. 5. The patient may stop CCM services at any time (effective at the end of the month) by phone call to the office staff. 6. The patient will be responsible for cost sharing (co-pay) of up to 20% of the service fee (after annual deductible is met).  Patient agreed to services and verbal consent obtained.   The patient verbalized understanding of instructions provided today and declined a print copy of patient instruction materials.   Follow up plan: No further SW follow up planned at this time. The patient will be contacted by CM team over the next 28 days.   Daneen Schick, BSW, CDP Social Worker, Certified Dementia Practitioner [place] [facility] [place] [facility] [phone]

## 2019-06-11 ENCOUNTER — Ambulatory Visit: Payer: Self-pay | Admitting: Pharmacist

## 2019-06-11 DIAGNOSIS — E782 Mixed hyperlipidemia: Secondary | ICD-10-CM

## 2019-06-11 DIAGNOSIS — I10 Essential (primary) hypertension: Secondary | ICD-10-CM

## 2019-06-11 DIAGNOSIS — K5900 Constipation, unspecified: Secondary | ICD-10-CM

## 2019-06-12 DIAGNOSIS — G4733 Obstructive sleep apnea (adult) (pediatric): Secondary | ICD-10-CM | POA: Diagnosis not present

## 2019-06-14 ENCOUNTER — Ambulatory Visit (INDEPENDENT_AMBULATORY_CARE_PROVIDER_SITE_OTHER): Payer: Medicare HMO

## 2019-06-14 ENCOUNTER — Other Ambulatory Visit: Payer: Self-pay

## 2019-06-14 VITALS — BP 132/84 | HR 75 | Temp 98.4°F

## 2019-06-14 DIAGNOSIS — Z23 Encounter for immunization: Secondary | ICD-10-CM

## 2019-06-14 NOTE — Progress Notes (Signed)
  Chronic Care Management   Initial Visit Note  06/11/2019 Name: Kevin Weiss MRN: [id number] DOB: 1941-06-08  Referred by: Shelby Mattocks, PA-C Reason for referral : Chronic Care Management   Kevin Weiss is a 78 y.o. year old male who is a primary care patient of [place], Sunday Spillers, [place]. The CCM team was consulted for assistance with chronic disease management and care coordination needs related to HTN and constipation  Review of patient status, including review of consultants reports, relevant laboratory and other test results, and collaboration with appropriate care team members and the patient's provider was performed as part of comprehensive patient evaluation and provision of chronic care management services.    I spoke with Kevin Weiss by telephone today  Advanced Directives Status: [place] and [employer] application for related entries.   Medications: Outpatient Encounter Medications as of 06/11/2019  Medication Sig  . aspirin 81 MG tablet Take 81 mg by mouth daily.  . Cholecalciferol (VITAMIN D-3) 1000 UNITS CAPS Take 1 capsule by mouth daily.  . ferrous sulfate 325 (65 FE) MG tablet One qd  . hydrochlorothiazide (MICROZIDE) 12.5 MG capsule TAKE 1 CAPSULE BY MOUTH EVERY DAY  . linaclotide (LINZESS) 72 MCG capsule Take 1 capsule (72 mcg total) by mouth daily before breakfast. (Patient taking differently: Take 72 mcg by mouth as needed. )  . lisinopril (ZESTRIL) 10 MG tablet TAKE 1 TABLET BY MOUTH EVERY DAY  . oxybutynin (DITROPAN XL) 15 MG 24 hr tablet Take 15 mg by mouth at bedtime.   No facility-administered encounter medications on file as of 06/11/2019.      Objective:   Goals Addressed            This Visit's Progress     Patient Stated   . I need to manage my constipation (pt-stated)       Current Barriers:  . Financial . Self-manages medications.  Uses pill bottles . Chronic Constipation o Patient has tried and  failed miralax, colace and stimulant laxatives o He has been dealing with constipation for years and it has vastly affected his quality of life o He states the only medication that works is Linzess (costly). He cannot afford this medication out of pocket.  He has been obtaining samples from PCP   Pharmacist Clinical Goal(s):  Marland Kitchen Over the next 90 days, patient will work with PharmD and provider towards optimized medication management . Over the next 30 days, patient will work with PharmD and PCP to obtain patient assistance for Linzess  Interventions: . Comprehensive medication review performed; medication list updated in electronic medical record . Counseled patient on diet & lifestyle modifications for patients with constipation o Exercised as able o Increase water intake o Continue to take medications as prescribed  Patient Self Care Activities:  . Patient will take medications as prescribed . Patient will focus on improved adherence by applying for financial assistance to obtain medication  Initial goal documentation       Other   . Patient Stated          Plan:   The care management team will reach out to the patient again over the next 30 days.   Provider Signature  Regina Eck, PharmD, BCPS Clinical Pharmacist, [facility] [place]: [phone]

## 2019-06-14 NOTE — Patient Instructions (Signed)
Visit Information  Goals Addressed            This Visit's Progress     Patient Stated   . I need to manage my constipation (pt-stated)       Current Barriers:  . Financial . Self-manages medications.  Uses pill bottles . Chronic Constipation o Patient has tried and failed miralax, colace and stimulant laxatives o He has been dealing with constipation for years and it has vastly affected his quality of life o He states the only medication that works is Linzess (costly). He cannot afford this medication out of pocket.  He has been obtaining samples from PCP   Pharmacist Clinical Goal(s):  Marland Kitchen Over the next 90 days, patient will work with PharmD and provider towards optimized medication management . Over the next 30 days, patient will work with PharmD and PCP to obtain patient assistance for Linzess  Interventions: . Comprehensive medication review performed; medication list updated in electronic medical record . Counseled patient on diet & lifestyle modifications for patients with constipation o Exercised as able o Increase water intake o Continue to take medications as prescribed  Patient Self Care Activities:  . Patient will take medications as prescribed . Patient will focus on improved adherence by applying for financial assistance to obtain medication  Initial goal documentation       Other   . Patient Stated         The patient verbalized understanding of instructions provided today and declined a print copy of patient instruction materials.   The care management team will reach out to the patient again over the next 30 days.   SIGNATURE Regina Eck, PharmD, BCPS Clinical Pharmacist, [facility] [place]: [phone]

## 2019-06-14 NOTE — Progress Notes (Signed)
Pt presents today for flu shot, to sign paper work left from pharmacist, also received samples from [facility]

## 2019-06-19 ENCOUNTER — Other Ambulatory Visit: Payer: Self-pay | Admitting: Pharmacy Technician

## 2019-06-19 NOTE — Patient Outreach (Signed)
[facility]) Care Management  06/19/2019  Kevin Weiss 21-Jul-1941 [id number]   Received patient and provider portion(s) of patient assistance application(s) for Linzess. Faxed completed application and required documents into [employer].  Will follow up with company(ies) in 21-28 business days to check status of application(s).  Maud Deed Chana Bode Dunlap Certified Pharmacy Technician [place] [facility] Direct [phone]

## 2019-06-21 ENCOUNTER — Other Ambulatory Visit: Payer: Self-pay

## 2019-06-21 ENCOUNTER — Ambulatory Visit (INDEPENDENT_AMBULATORY_CARE_PROVIDER_SITE_OTHER): Payer: Medicare HMO | Admitting: Internal Medicine

## 2019-06-21 ENCOUNTER — Encounter: Payer: Self-pay | Admitting: Internal Medicine

## 2019-06-21 VITALS — BP 124/76 | HR 74 | Temp 98.5°F | Ht 66.8 in | Wt 247.6 lb

## 2019-06-21 DIAGNOSIS — L723 Sebaceous cyst: Secondary | ICD-10-CM

## 2019-06-21 DIAGNOSIS — E7800 Pure hypercholesterolemia, unspecified: Secondary | ICD-10-CM | POA: Diagnosis not present

## 2019-06-21 DIAGNOSIS — D508 Other iron deficiency anemias: Secondary | ICD-10-CM | POA: Diagnosis not present

## 2019-06-21 NOTE — Progress Notes (Signed)
Subjective:     Patient ID: Kevin Weiss , male    DOB: [birth year] , 78 y.o.   MRN: [id number]   Chief Complaint  Patient presents with  . Hyperlipidemia    HPI 1-Pt is here to have his LDL rechecked.  Has been taking flax seed one capsule a day and garlic pills one a day to help bring down the LDL.  The bottle recommends for him to take it tid, but is too much to remember.  2- R axilla lump x 1 week, little sore to touch. Does not bother him otherwise. This area has not been draining, and has not had this lump in this area before.    Past Medical History:  Diagnosis Date  . Arthritis   . Cardiomegaly 10/13/2006   Qualifier: Diagnosis of  By: Kevin Weiss    . CHF, EJECTION FRACTION > OR = 50% 10/13/2006   Qualifier: Diagnosis of  By: Kevin Weiss    . Degenerative joint disease (DJD) of hip 03/12/2014  . GASTROESOPHAGEAL REFLUX, NO ESOPHAGITIS 10/13/2006   patient denies  . HEMORRHOIDS, NOS 10/13/2006   Qualifier: Diagnosis of  By: Kevin Weiss    . History of colon polyps   . Hypertension    takes Amlodipine and Prinizide daily  . 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 Morbid obesity ([place]) 11/03/2015  . OSA on CPAP 11/03/2015  . Retrognathia 11/03/2015     Family History  Problem Relation Age of Onset  . Hypertension Mother   . Hyperlipidemia Mother   . Alzheimer's disease Mother   . Heart disease Father   . Hyperlipidemia Father   . Hypertension Father   . Hypertension Sister   . Hypertension Brother   . Heart attack Maternal Grandmother      Current Outpatient Medications:  .  aspirin 81 MG tablet, Take 81 mg by mouth daily., Disp: , Rfl:  .  Cholecalciferol (VITAMIN D-3) 1000 UNITS CAPS, Take 1 capsule by mouth daily., Disp: , Rfl:  .  ferrous sulfate 325 (65 FE) MG tablet, One qd, Disp: 1 tablet, Rfl: 0 .  hydrochlorothiazide (MICROZIDE) 12.5 MG capsule, TAKE 1 CAPSULE BY MOUTH EVERY DAY, Disp: 90  capsule, Rfl: 0 .  linaclotide (LINZESS) 72 MCG capsule, Take 1 capsule (72 mcg total) by mouth daily before breakfast. (Patient taking differently: Take 72 mcg by mouth as needed. ), Disp: 30 capsule, Rfl: 3 .  lisinopril (ZESTRIL) 10 MG tablet, TAKE 1 TABLET BY MOUTH EVERY DAY, Disp: 90 tablet, Rfl: 0 .  oxybutynin (DITROPAN XL) 15 MG 24 hr tablet, Take 15 mg by mouth at bedtime., Disp: , Rfl:    No Known Allergies   Review of Systems   Constitutional: Negative for diaphoresis and unexpected weight change.  HENT: Negative for tinnitus.   Eyes: Negative for visual disturbance.  Respiratory: Negative for chest tightness and shortness of breath.   Cardiovascular: Negative for chest pain, palpitations and leg swelling.  Gastrointestinal: Negative for constipation, diarrhea and nausea.  Endocrine: Negative for polydipsia, polyphagia and polyuria.  Genitourinary: Negative for dysuria and frequency.  Skin: Negative for rash and wound. Has a lump on R axilla.  Neurological: Negative for dizziness, speech difficulty, weakness, numbness and headaches.  Today's Vitals   06/21/19 1124  BP: 124/76  Pulse: 74  Temp: 98.5 F (36.9 C)  TempSrc: Oral  Weight: 247 lb 9.6 oz (112.3 kg)  Height: 5' 6.8" (  1.697 m)   Body mass index is 39.01 kg/m.   Objective:  Physical Exam   Constitutional: he is oriented to person, place, and time. he appears well-developed and well-nourished. No distress.  HENT:  Head: Normocephalic and atraumatic.  Right Ear: External ear normal.  Left Ear: External ear normal.  Nose: Nose normal.  Eyes: Conjunctivae are normal. Right eye exhibits no discharge. Left eye exhibits no discharge. No scleral icterus.  Neck: Neck supple. No thyromegaly present.  No carotid bruits bilaterally  Cardiovascular: Normal rate and regular rhythm.  No murmur heard. Pulmonary/Chest: Effort normal and breath sounds normal. No respiratory distress.  Musculoskeletal: Normal range of  motion. She exhibits no edema.  Lymphadenopathy: he has no cervical adenopathy.  Neurological: he is alert and oriented to person, place, and time.  Skin: Skin is warm and dry. Capillary refill takes less than 2 seconds. No rash noted. he is not diaphoretic. R Axilla has a 1/2 x 1 cm oval superficial induration which is not red or hot. Is minimally tender. No axillary nodes noted.  Psychiatric: he has a normal mood and affect. His behavior is normal. Judgment and thought content normal.  Nursing note reviewed.     Assessment And Plan:     1. Elevated LDL cholesterol level- chronic  - Lipid Profile  2. Other iron deficiency anemia- chronic. - Iron   3- sebaceous cyst R axilla- new. It does not look infected, so I advised him to not touch it too much and apply heat 10 min at a time 2-3 times a day for a few days. If area gets painful or larger, needs to go to urgent care or come here.   FU 3 months.  Kevin Longley RODRIGUEZ-SOUTH[place]TH, PA-C    THE PATIENT IS ENCOURAGED TO PRACTICE SOCIAL DISTANCING DUE TO THE COVID-19 PANDEMIC.

## 2019-06-22 ENCOUNTER — Telehealth: Payer: Self-pay

## 2019-06-22 LAB — LIPID PANEL
Chol/HDL Ratio: 4.1 ratio (ref 0.0–5.0)
Cholesterol, Total: 169 mg/dL (ref 100–199)
HDL: 41 mg/dL (ref >39)
LDL Chol Calc (NIH): 115 mg/dL — ABNORMAL HIGH (ref 0–99)
Triglycerides: 70 mg/dL (ref 0–149)
VLDL Cholesterol Cal: 13 mg/dL (ref 5–40)

## 2019-06-22 LAB — IRON: Iron: 98 ug/dL (ref 38–169)

## 2019-06-26 ENCOUNTER — Telehealth: Payer: Self-pay

## 2019-06-26 ENCOUNTER — Other Ambulatory Visit: Payer: Self-pay | Admitting: Internal Medicine

## 2019-06-26 NOTE — Progress Notes (Signed)
This encounter was created in error - please disregard.

## 2019-06-26 NOTE — Telephone Encounter (Signed)
Spoke with pt, gave him message from provider. Pt pleased with result  Rodriguez-[place], Sandrea Matte  Candiss Norse T, CMA        Please inform him that his bad cholesterol, the LDL is better than last one. Was 127 and now is 115.

## 2019-07-04 ENCOUNTER — Other Ambulatory Visit: Payer: Self-pay | Admitting: Pharmacy Technician

## 2019-07-04 NOTE — Patient Outreach (Signed)
[facility]) Care Management  07/04/2019  BALAM MERCEDES 01-Oct-1940 [id number]    Follow up call placed to [place] regarding patient assistance application(s) for Linzess , Asencion [place] states that patients application had been denied due to not meeting program co-pay requirement. Patient to to receive appeal information in the mail that can be submitted.  Follow up:  Will route note to [place] to inform.  Maud Deed Chana Bode Lesslie Certified Pharmacy Technician [place] [facility] Direct Dial:[phone]

## 2019-07-05 ENCOUNTER — Telehealth: Payer: Self-pay | Admitting: Pharmacist

## 2019-07-16 ENCOUNTER — Ambulatory Visit (INDEPENDENT_AMBULATORY_CARE_PROVIDER_SITE_OTHER): Payer: Medicare HMO | Admitting: Pharmacist

## 2019-07-16 DIAGNOSIS — E782 Mixed hyperlipidemia: Secondary | ICD-10-CM | POA: Diagnosis not present

## 2019-07-16 DIAGNOSIS — I10 Essential (primary) hypertension: Secondary | ICD-10-CM

## 2019-07-18 NOTE — Progress Notes (Signed)
  Chronic Care Management    Visit Note  07/16/2019 Name: Kevin Weiss MRN: [id number] DOB: 1941-06-18  Referred by: Shelby Mattocks, PA-C Reason for referral : Chronic Care Management   Kevin Weiss is a 78 y.o. year old male who is a primary care patient of [place], Sunday Spillers, [place]. The CCM team was consulted for assistance with chronic disease management and care coordination needs related to constipation  Review of patient status, including review of consultants reports, relevant laboratory and other test results, and collaboration with appropriate care team members and the patient's provider was performed as part of comprehensive patient evaluation and provision of chronic care management services.    I spoke with Ms. Berent by telephone today  Medications: Outpatient Encounter Medications as of 07/16/2019  Medication Sig  . aspirin 81 MG tablet Take 81 mg by mouth daily.  . Cholecalciferol (VITAMIN D-3) 1000 UNITS CAPS Take 1 capsule by mouth daily.  . ferrous sulfate 325 (65 FE) MG tablet One qd  . hydrochlorothiazide (MICROZIDE) 12.5 MG capsule TAKE 1 CAPSULE BY MOUTH EVERY DAY  . linaclotide (LINZESS) 72 MCG capsule Take 1 capsule (72 mcg total) by mouth daily before breakfast. (Patient taking differently: Take 72 mcg by mouth as needed. )  . lisinopril (ZESTRIL) 10 MG tablet TAKE 1 TABLET BY MOUTH EVERY DAY  . oxybutynin (DITROPAN XL) 15 MG 24 hr tablet Take 15 mg by mouth at bedtime.   No facility-administered encounter medications on file as of 07/16/2019.      Objective:   Goals Addressed            This Visit's Progress     Patient Stated   . I need to manage my constipation (pt-stated)       Current Barriers:  . Financial . Self-manages medications.  Uses pill bottles . Chronic Constipation o Patient has tried and failed miralax, colace and stimulant laxatives o He has been dealing with constipation for years and it has  vastly affected his quality of life o He states the only medication that works is Linzess (costly). He cannot afford this medication out of pocket.  He has been obtaining samples from PCP  Pharmacist Clinical Goal(s):  Marland Kitchen Over the next 90 days, patient will work with PharmD and provider towards optimized medication management . Over the next 30 days, patient will work with PharmD and PCP to obtain patient assistance for Linzess  Interventions: . Comprehensive medication review performed; medication list updated in electronic medical record . Counseled patient on diet & lifestyle modifications for patients with constipation o Exercised as able o Increase water intake o Continue to take medications as prescribed o Prepared [employer] silver scripts tier exception for Linzess to see if we can reduce copay ($155/month) o PCP to sign if she agrees with plan.  Patient was denied for patient assistance due to income.  Patient Self Care Activities:  . Patient will take medications as prescribed . Patient will focus on improved adherence by applying for financial assistance to obtain medication  Please see past updates related to this goal by clicking on the "Past Updates" button in the selected goal          Plan:   The care management team will reach out to the patient again over the next 30 days.   Provider Signature Regina Eck, PharmD, BCPS Clinical Pharmacist, [facility] [place]: [phone]

## 2019-07-18 NOTE — Patient Instructions (Signed)
Visit Information  Goals Addressed            This Visit's Progress     Patient Stated   . I need to manage my constipation (pt-stated)       Current Barriers:  . Financial . Self-manages medications.  Uses pill bottles . Chronic Constipation o Patient has tried and failed miralax, colace and stimulant laxatives o He has been dealing with constipation for years and it has vastly affected his quality of life o He states the only medication that works is Linzess (costly). He cannot afford this medication out of pocket.  He has been obtaining samples from PCP  Pharmacist Clinical Goal(s):  Marland Kitchen Over the next 90 days, patient will work with PharmD and provider towards optimized medication management . Over the next 30 days, patient will work with PharmD and PCP to obtain patient assistance for Linzess  Interventions: . Comprehensive medication review performed; medication list updated in electronic medical record . Counseled patient on diet & lifestyle modifications for patients with constipation o Exercised as able o Increase water intake o Continue to take medications as prescribed o Prepared [employer] silver scripts tier exception for Linzess to see if we can reduce copay ($155/month) o PCP to sign if she agrees with plan.  Patient was denied for patient assistance due to income.  Patient Self Care Activities:  . Patient will take medications as prescribed . Patient will focus on improved adherence by applying for financial assistance to obtain medication  Please see past updates related to this goal by clicking on the "Past Updates" button in the selected goal         The patient verbalized understanding of instructions provided today and declined a print copy of patient instruction materials.   The care management team will reach out to the patient again over the next 30 days.   SIGNATURE Regina Eck, PharmD, BCPS Clinical Pharmacist, [facility] [place]: [phone]

## 2019-07-19 DIAGNOSIS — G4733 Obstructive sleep apnea (adult) (pediatric): Secondary | ICD-10-CM | POA: Diagnosis not present

## 2019-07-25 ENCOUNTER — Telehealth: Payer: Self-pay | Admitting: Pharmacist

## 2019-07-31 ENCOUNTER — Telehealth: Payer: Self-pay

## 2019-08-02 ENCOUNTER — Other Ambulatory Visit (HOSPITAL_COMMUNITY): Payer: Self-pay | Admitting: Internal Medicine

## 2019-08-02 ENCOUNTER — Ambulatory Visit (INDEPENDENT_AMBULATORY_CARE_PROVIDER_SITE_OTHER): Payer: Medicare HMO | Admitting: Pharmacist

## 2019-08-02 DIAGNOSIS — K5909 Other constipation: Secondary | ICD-10-CM

## 2019-08-02 DIAGNOSIS — I10 Essential (primary) hypertension: Secondary | ICD-10-CM | POA: Diagnosis not present

## 2019-08-02 DIAGNOSIS — K5900 Constipation, unspecified: Secondary | ICD-10-CM

## 2019-08-02 MED ORDER — LUBIPROSTONE 8 MCG PO CAPS: 8.0000 ug | ORAL_CAPSULE | Freq: Two times a day (BID) | ORAL | 0 refills | Status: DC

## 2019-08-06 ENCOUNTER — Encounter: Payer: Self-pay | Admitting: Adult Health

## 2019-08-06 ENCOUNTER — Other Ambulatory Visit: Payer: Self-pay

## 2019-08-06 ENCOUNTER — Ambulatory Visit (INDEPENDENT_AMBULATORY_CARE_PROVIDER_SITE_OTHER): Payer: Medicare HMO | Admitting: Adult Health

## 2019-08-06 VITALS — BP 131/73 | HR 80 | Temp 97.6°F | Ht 68.0 in | Wt 248.0 lb

## 2019-08-06 DIAGNOSIS — Z9989 Dependence on other enabling machines and devices: Secondary | ICD-10-CM | POA: Diagnosis not present

## 2019-08-06 DIAGNOSIS — G4733 Obstructive sleep apnea (adult) (pediatric): Secondary | ICD-10-CM

## 2019-08-06 NOTE — Patient Instructions (Signed)
Continue using CPAP nightly and greater than 4 hours each night °If your symptoms worsen or you develop new symptoms please let us know.  ° °

## 2019-08-06 NOTE — Patient Instructions (Signed)
Visit Information  Goals Addressed            This Visit's Progress     Patient Stated   . I need to manage my chronic constipation (pt-stated)       Current Barriers:  . Financial . Self-manages medications.  Uses pill bottles from pharmacy . Chronic Constipation o Patient has tried and failed miralax, colace and stimulant laxatives o He has been dealing with constipation for years and it has vastly affected his quality of life o He states the only medication that works is Linzess 142mg (costly). He cannot afford this medication out of pocket.  He has been obtaining samples from PCP  Pharmacist Clinical Goal(s):  .Marland KitchenOver the next 90 days, patient will work with PharmD and provider towards optimized medication management . Over the next 30 days, patient will work with PharmD and PCP to obtain patient assistance for Linzess  Interventions: . Comprehensive medication review performed; medication list updated in electronic medical record . Counseled patient on diet & lifestyle modifications for patients with constipation o Exercised as able o Increase water intake o Continue to take medications as prescribed o Prepared [employer] silver scripts tier exception for Linzess to see if we can reduce copay ($155/month) o PCP to sign if she agrees with plan.  Patient was denied for patient assistance due to income. - 08/02/19 [facility]  Will follow up in 30 days. - Amitiza copay $128--pt unable to afford.    Patient Self Care Activities:  . Patient will take medications as prescribed . Patient will focus on improved adherence by applying for financial assistance to obtain medication  Please see past updates related to this goal by clicking on the "Past Updates" button in the selected goal      . I would like to control my blood pressure (pt-stated)       Current Barriers:  . Uncontrolled hypertension, complicated by HLD, CHF . Current antihypertensive regimen:  lisinopril, HCTZ . Previous antihypertensives tried: amlodipine (d/c'd due to patient preference/liver) . Current home BP readings: 120s/70-80s . Most recent eGFR/CrCl 76 (Scr 1.22)  Pharmacist Clinical Goal(s):  .Marland KitchenOver the next 90 days, patient will work with PharmD and providers to optimize antihypertensive regimen  Interventions: . Comprehensive medication review performed; medication list updated in the electronic medical record.  . Reviewed purpose and side effects--patient denies adverse events . BP controlled-continue current management . Patient denies chest pain, however could benefit from nitrates PRN at home . Will continue to follow  Patient Self Care Activities:  . Patient will continue to check BP 3x weekly or if symptomatic, document, and provide at future appointments . Patient will focus on medication adherence  Initial goal documentation        The patient verbalized understanding of instructions provided today and declined a print copy of patient instruction materials.   The care management team will reach out to the patient again over the next 30 days.   SIGNATURE JRegina Eck PharmD, BCPS Clinical Pharmacist, [facility] [place] [phone]

## 2019-08-06 NOTE — Progress Notes (Signed)
PATIENT: Kevin Weiss DOB: 1940/10/03  REASON FOR VISIT: follow up HISTORY FROM: patient  HISTORY OF PRESENT ILLNESS: Today 08/06/19: Kevin Weiss is a 78 year old male with a history of obstructive sleep apnea on CPAP.  His download indicates that he uses machine nightly for compliance of 100%.  He uses machine greater than 4 hours each night.  On average he uses his machine 7 hours and 50 minutes.  His residual AHI is 2.8 on 7 to 16 cm of water with EPR 3.  His leak in the 95th percentile is 10 L/min.  He reports that the CPAP continues to work well for him.  He returns today for an evaluation.  HISTORY 08/02/18: Kevin Weiss is a 78 year old male with a history of obstructive sleep apnea on CPAP.  His CPAP download indicates that he uses machine 30 out of 30 days for compliance of 100%.  He uses machine greater than 4 hours each night.  On average he uses his machine 7 hours and 33 minutes.  His residual AHI is 3.6 on 7 to 16 cm of water with EPR of 3.  He does not have a significant leak.  He returns today for evaluation.  REVIEW OF SYSTEMS: Out of a complete 14 system review of symptoms, the patient complains only of the following symptoms, and all other reviewed systems are negative.  See HPI  ALLERGIES: No Known Allergies  HOME MEDICATIONS: Outpatient Medications Prior to Visit  Medication Sig Dispense Refill  . aspirin 81 MG tablet Take 81 mg by mouth daily.    . Cholecalciferol (VITAMIN D-3) 1000 UNITS CAPS Take 1 capsule by mouth daily.    . ferrous sulfate 325 (65 FE) MG tablet One qd 1 tablet 0  . Flaxseed, Linseed, (FLAXSEED OIL) 1000 MG CAPS Take by mouth.    . Garlic (GARLIQUE PO) Take by mouth.    . hydrochlorothiazide (MICROZIDE) 12.5 MG capsule TAKE 1 CAPSULE BY MOUTH EVERY DAY 90 capsule 0  . lisinopril (ZESTRIL) 10 MG tablet TAKE 1 TABLET BY MOUTH EVERY DAY 90 tablet 0  . oxybutynin (DITROPAN XL) 15 MG 24 hr tablet Take 15 mg by mouth at bedtime.    Marland Kitchen  lubiprostone (AMITIZA) 8 MCG capsule Take 1 capsule (8 mcg total) by mouth 2 (two) times daily with a meal. (Patient not taking: Reported on 08/06/2019) 60 capsule 0   No facility-administered medications prior to visit.    PAST MEDICAL HISTORY: Past Medical History:  Diagnosis Date  . Arthritis   . Cardiomegaly 10/13/2006   Qualifier: Diagnosis of  By: Eusebio Friendly    . CHF, EJECTION FRACTION > OR = 50% 10/13/2006   Qualifier: Diagnosis of  By: Eusebio Friendly    . Degenerative joint disease (DJD) of hip 03/12/2014  . GASTROESOPHAGEAL REFLUX, NO ESOPHAGITIS 10/13/2006   patient denies  . HEMORRHOIDS, NOS 10/13/2006   Qualifier: Diagnosis of  By: Eusebio Friendly    . History of colon polyps   . Hypertension    takes Amlodipine and Prinizide daily  . 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 Morbid obesity ([place]) 11/03/2015  . OSA on CPAP 11/03/2015  . Retrognathia 11/03/2015    PAST SURGICAL HISTORY: Past Surgical History:  Procedure Laterality Date  . COLONOSCOPY    . KNEE ARTHROSCOPY Left 15+yrs ago  . TOTAL HIP ARTHROPLASTY Right 03/12/2014   Procedure: TOTAL HIP ARTHROPLASTY ANTERIOR APPROACH;  Surgeon: Monico Blitz  Rhona Raider, MD;  Location: [place];  Service: Orthopedics;  Laterality: Right;  . TOTAL HIP ARTHROPLASTY Left 04/11/2018   Procedure: TOTAL HIP ARTHROPLASTY ANTERIOR APPROACH;  Surgeon: Melrose Nakayama, MD;  Location: [place];  Service: Orthopedics;  Laterality: Left;    FAMILY HISTORY: Family History  Problem Relation Age of Onset  . Hypertension Mother   . Hyperlipidemia Mother   . Alzheimer's disease Mother   . Heart disease Father   . Hyperlipidemia Father   . Hypertension Father   . Hypertension Sister   . Hypertension Brother   . Heart attack Maternal Grandmother     SOCIAL HISTORY: Social History   Socioeconomic History  . Marital status: Married    Spouse name: Not on file  . Number of children: Not on file   . Years of education: Not on file  . Highest education level: Not on file  Occupational History  . Occupation: retired  Tobacco Use  . Smoking status: Former Smoker    Packs/day: 0.25    Years: 10.00    Pack years: 2.50  . Smokeless tobacco: Never Used  . Tobacco comment: 40 + years  Substance and Sexual Activity  . Alcohol use: Yes    Comment: rarely  . Drug use: Not Currently    Types: Marijuana    Comment: 03/30/18  . Sexual activity: Not Currently  Other Topics Concern  . Not on file  Social History Narrative   Live at home with wife. Retired from [employer] as a Librarian, academic.    Social Determinants of Health   Financial Resource Strain: Low Risk   . Difficulty of Paying Living Expenses: Not very hard  Food Insecurity: No Food Insecurity  . Worried About Charity fundraiser in the Last Year: Never true  . Ran Out of Food in the Last Year: Never true  Transportation Needs: No Transportation Needs  . Lack of Transportation (Medical): No  . Lack of Transportation (Non-Medical): No  Physical Activity: Insufficiently Active  . Days of Exercise per Week: 3 days  . Minutes of Exercise per Session: 30 min  Stress: No Stress Concern Present  . Feeling of Stress : Not at all  Social Connections:   . Frequency of Communication with Friends and Family: Not on file  . Frequency of Social Gatherings with Friends and Family: Not on file  . Attends Religious Services: Not on file  . Active Member of Clubs or Organizations: Not on file  . Attends Archivist Meetings: Not on file  . Marital Status: Not on file  Intimate Partner Violence: Not At Risk  . Fear of Current or Ex-Partner: No  . Emotionally Abused: No  . Physically Abused: No  . Sexually Abused: No      PHYSICAL EXAM  Vitals:   08/06/19 1410  BP: 131/73  Pulse: 80  Temp: 97.6 F (36.4 C)  Weight: 248 lb (112.5 kg)  Height: 5\' 8"  (1.727 m)   Body mass index is 37.71 kg/m.    Generalized: Well developed, in no acute distress  Chest: Lungs clear to auscultation bilaterally  Neurological examination  Mentation: Alert oriented to time, place, history taking. Follows all commands speech and language fluent Cranial nerve II-XII: Extraocular movements were full, visual field were full on confrontational test Head turning and shoulder shrug  were normal and symmetric. Motor: The motor testing reveals 5 over 5 strength of all 4 extremities. Good symmetric motor tone is noted throughout.  Sensory: Sensory testing is intact to soft touch on all 4 extremities. No evidence of extinction is noted.  Gait and station: Gait is normal.   DIAGNOSTIC DATA (LABS, IMAGING, TESTING) - I reviewed patient records, labs, notes, testing and imaging myself where available.  Lab Results  Component Value Date   WBC 4.8 05/15/2019   HGB 15.7 05/15/2019   HCT 47.2 05/15/2019   MCV 86 05/15/2019   PLT 198 05/15/2019      Component Value Date/Time   NA 138 05/15/2019 1238   K 4.6 05/15/2019 1238   CL 99 05/15/2019 1238   CO2 22 05/15/2019 1238   GLUCOSE 89 05/15/2019 1238   GLUCOSE 138 (H) 04/12/2018 0358   BUN 17 05/15/2019 1238   CREATININE 1.12 05/15/2019 1238   CALCIUM 9.7 05/15/2019 1238   PROT 7.2 05/15/2019 1238   ALBUMIN 4.6 05/15/2019 1238   AST 22 05/15/2019 1238   ALT 22 05/15/2019 1238   ALKPHOS 215 (H) 05/15/2019 1238   BILITOT 0.5 05/15/2019 1238   GFRNONAA 64 05/15/2019 1238   GFRAA 74 05/15/2019 1238   Lab Results  Component Value Date   CHOL 169 06/21/2019   HDL 41 06/21/2019   LDLCALC 115 (H) 06/21/2019   TRIG 70 06/21/2019   CHOLHDL 4.1 06/21/2019    Lab Results  Component Value Date   TSH 1.860 05/15/2019      ASSESSMENT AND PLAN 78 y.o. year old male  has a past medical history of Arthritis, Cardiomegaly (10/13/2006), CHF, EJECTION FRACTION > OR = 50% (10/13/2006), Degenerative joint disease (DJD) of hip (03/12/2014), GASTROESOPHAGEAL  REFLUX, NO ESOPHAGITIS (10/13/2006), HEMORRHOIDS, NOS (10/13/2006), History of colon polyps, Hypertension, IMPOTENCE, ORGANIC (10/13/2006), Morbid obesity ([place]) (11/03/2015), OSA on CPAP (11/03/2015), and Retrognathia (11/03/2015). here with:  1. Obstructive sleep apnea on CPAP  The patient's CPAP download shows excellent compliance and good treatment of his apnea.  He is encouraged to continue using CPAP nightly and greater than 4 hours each night.  He is advised that if his symptoms worsen or he develops new symptoms he should let us know.  He will follow-up in 1 year or sooner if needed    I spent 15 minutes with the patient. 50% of this time was spent reviewing CPAP download   Ward Givens, MSN, NP-C 08/06/2019, 2:11 PM [facility] [address] [place], [place] [address] [phone]

## 2019-08-06 NOTE — Progress Notes (Signed)
Chronic Care Management    Visit Note  08/02/2019 Name: Kevin Weiss MRN: [id number] DOB: 1941-08-07  Referred by: Shelby Mattocks, PA-C Reason for referral : Chronic Care Management   Kevin Weiss is a 78 y.o. year old male who is a primary care patient of [place], Sunday [place]ill[place], [place]. The CCM team was consulted for assistance with chronic disease management and care coordination needs related to HTN and chronic constipation  Review of patient status, including review of consultants reports, relevant laboratory and other test results, and collaboration with appropriate care team members and the patient's provider was performed as part of comprehensive patient evaluation and provision of chronic care management services.    I spoke with Kevin Weiss by telephone today.  Medications: Outpatient Encounter Medications as of 08/02/2019  Medication Sig  . aspirin 81 MG tablet Take 81 mg by mouth daily.  . Cholecalciferol (VITAMIN D-3) 1000 UNITS CAPS Take 1 capsule by mouth daily.  . ferrous sulfate 325 (65 FE) MG tablet One qd  . hydrochlorothiazide (MICROZIDE) 12.5 MG capsule TAKE 1 CAPSULE BY MOUTH EVERY DAY  . lisinopril (ZESTRIL) 10 MG tablet TAKE 1 TABLET BY MOUTH EVERY DAY  . lubiprostone (AMITIZA) 8 MCG capsule Take 1 capsule (8 mcg total) by mouth 2 (two) times daily with a meal.  . oxybutynin (DITROPAN XL) 15 MG 24 hr tablet Take 15 mg by mouth at bedtime.   No facility-administered encounter medications on file as of 08/02/2019.     Objective:   Goals Addressed            This Visit's Progress     Patient Stated   . I need to manage my chronic constipation (pt-stated)       Current Barriers:  . Financial . Self-manages medications.  Uses pill bottles from pharmacy . Chronic Constipation o Patient has tried and failed miralax, colace and stimulant laxatives o He has been dealing with constipation for years and it has vastly affected his  quality of life o He states the only medication that works is Linzess 19mg (costly). He cannot afford this medication out of pocket.  He has been obtaining samples from PCP  Pharmacist Clinical Goal(s):  .Marland KitchenOver the next 90 days, patient will work with PharmD and provider towards optimized medication management . Over the next 30 days, patient will work with PharmD and PCP to obtain patient assistance for Linzess  Interventions: . Comprehensive medication review performed; medication list updated in electronic medical record . Counseled patient on diet & lifestyle modifications for patients with constipation o Exercised as able o Increase water intake o Continue to take medications as prescribed o Prepared [employer] silver scripts tier exception for Linzess to see if we can reduce copay ($155/month) o PCP to sign if she agrees with plan.  Patient was denied for patient assistance due to income. - 08[facility]02/19 [facility]  Will follow up in 30 days. - Amitiza copay $128--pt unable to afford.    Patient Self Care Activities:  . Patient will take medications as prescribed . Patient will focus on improved adherence by applying for financial assistance to obtain medication  Please see past updates related to this goal by clicking on the "Past Updates" button in the selected goal      . I would like to control my blood pressure (pt-stated)       Current Barriers:  . Uncontrolled hypertension, complicated by HLD, CHF . Current antihypertensive regimen: lisinopril,  HCTZ . Previous antihypertensives tried: amlodipine (d/c'd due to patient preference/liver) . Current home BP readings: 120s/70-80s . Most recent eGFR/CrCl 76 (Scr 1.22)  Pharmacist Clinical Goal(s):  Marland Kitchen Over the next 90 days, patient will work with PharmD and providers to optimize antihypertensive regimen  Interventions: . Comprehensive medication review performed; medication list updated in the electronic  medical record.  . Reviewed purpose and side effects--patient denies adverse events . BP controlled-continue current management . Patient denies chest pain, however could benefit from nitrates PRN at home . Will continue to follow  Patient Self Care Activities:  . Patient will continue to check BP 3x weekly or if symptomatic, document, and provide at future appointments . Patient will focus on medication adherence  Initial goal documentation         Plan:   The care management team will reach out to the patient again over the next 30 days.   Provider Signature Regina Eck, PharmD, BCPS Clinical Pharmacist, [facility] [place]: [phone]

## 2019-08-13 ENCOUNTER — Telehealth: Payer: Self-pay | Admitting: Pharmacist

## 2019-08-15 ENCOUNTER — Telehealth: Payer: Self-pay

## 2019-08-15 NOTE — Telephone Encounter (Signed)
Pt LVM asking about getting the COVID vaccine. Called pt to inform him that we are not the COVID vaccine and not sure if we will be. But he can reach out to the local health department to receive information on how and when he can get one.

## 2019-08-20 ENCOUNTER — Ambulatory Visit (INDEPENDENT_AMBULATORY_CARE_PROVIDER_SITE_OTHER): Payer: Medicare HMO | Admitting: Pharmacist

## 2019-08-20 DIAGNOSIS — I10 Essential (primary) hypertension: Secondary | ICD-10-CM | POA: Diagnosis not present

## 2019-08-21 ENCOUNTER — Other Ambulatory Visit: Payer: Self-pay | Admitting: Internal Medicine

## 2019-08-21 DIAGNOSIS — I10 Essential (primary) hypertension: Secondary | ICD-10-CM

## 2019-08-22 DIAGNOSIS — Z96643 Presence of artificial hip joint, bilateral: Secondary | ICD-10-CM | POA: Diagnosis not present

## 2019-08-22 DIAGNOSIS — M545 Low back pain: Secondary | ICD-10-CM | POA: Diagnosis not present

## 2019-08-22 DIAGNOSIS — M79604 Pain in right leg: Secondary | ICD-10-CM | POA: Diagnosis not present

## 2019-08-22 NOTE — Patient Instructions (Signed)
Visit Information  Goals Addressed            This Visit's Progress     Patient Stated   . I need to manage my chronic constipation (pt-stated)       Current Barriers:  . Financial . Self-manages medications.  Uses pill bottles from pharmacy . Chronic Constipation o Patient has tried and failed miralax, colace and stimulant laxatives o He has been dealing with constipation for years and it has vastly affected his quality of life o He states the only medication that works is Linzess 1108mg (costly). He cannot afford this medication out of pocket.  He has been obtaining samples from PCP  Pharmacist Clinical Goal(s):  .Marland KitchenOver the next 90 days, patient will work with PharmD and provider towards optimized medication management . Over the next 30 days, patient will work with PharmD and PCP to obtain patient assistance for Linzess  Interventions: . Comprehensive medication review performed; medication list updated in electronic medical record . Counseled patient on diet & lifestyle modifications for patients with constipation o Exercised as able o Increase water intake o Continue to take medications as prescribed o Prepared [employer] silver scripts tier exception for Linzess to see if we can reduce copay ($155/month) o PCP to sign if she agrees with plan.  Patient was denied for patient assistance due to income. - 08[facility]02/19 [facility]  Will follow up in 30 days. - Amitiza copay $128--pt unable to afford.   - 08/20/2019-All methods to retrieve lower copays have been denied--patient will have to pay out of pocket and continue to get samples.  Patient Self Care Activities:  . Patient will take medications as prescribed . Patient will focus on improved adherence by applying for financial assistance to obtain medication  Please see past updates related to this goal by clicking on the "Past Updates" button in the selected goal      . I would like to control my blood  pressure (pt-stated)       Current Barriers:  . Uncontrolled hypertension, complicated by HLD, CHF . Current antihypertensive regimen: lisinopril, HCTZ . Previous antihypertensives tried: amlodipine (d/c'd due to patient preference/liver) . Current home BP readings: 120s/70-80s . Most recent eGFR/CrCl 76 (Scr 1.22)  Pharmacist Clinical Goal(s):  .Marland KitchenOver the next 90 days, patient will work with PharmD and providers to optimize antihypertensive regimen  Interventions: . Comprehensive medication review performed; medication list updated in the electronic medical record.  . Reviewed purpose and side effects--patient denies adverse events . BP controlled-continue current management . Patient denies chest pain, however could benefit from nitrates PRN at home . Will continue to follow  Patient Self Care Activities:  . Patient will continue to check BP 3x weekly or if symptomatic, document, and provide at future appointments . Patient will focus on medication adherence  Please see past updates related to this goal by clicking on the "Past Updates" button in the selected goal         The patient verbalized understanding of instructions provided today and declined a print copy of patient instruction materials.   The care management team will reach out to the patient again over the next 8 weeks  SIGNATURE JRegina Eck PharmD, BPeachlandPharmacist, [place] [phone]

## 2019-08-22 NOTE — Progress Notes (Signed)
Chronic Care Management    Visit Note  08/20/2019 Name: Kevin Weiss MRN: [id number] DOB: 15-Sep-1940  Referred by: Shelby Mattocks, PA-C Reason for referral : Chronic Care Management   Kevin Weiss is a 79 y.o. year old male who is a primary care patient of [place], Sunday Spillers, [place]. The CCM team was consulted for assistance with chronic disease management and care coordination needs related to HTN  Review of patient status, including review of consultants reports, relevant laboratory and other test results, and collaboration with appropriate care team members and the patient's provider was performed as part of comprehensive patient evaluation and provision of chronic care management services.     Medications: Outpatient Encounter Medications as of 08/20/2019  Medication Sig Note  . linaclotide (LINZESS) 145 MCG CAPS capsule Take 145 mcg by mouth daily before breakfast. 08/22/2019: samples  . aspirin 81 MG tablet Take 81 mg by mouth daily.   . Cholecalciferol (VITAMIN D-3) 1000 UNITS CAPS Take 1 capsule by mouth daily.   . ferrous sulfate 325 (65 FE) MG tablet One qd   . Flaxseed, Linseed, (FLAXSEED OIL) 1000 MG CAPS Take by mouth.   . Garlic (GARLIQUE PO) Take by mouth.   Marland Kitchen lisinopril (ZESTRIL) 10 MG tablet TAKE 1 TABLET BY MOUTH EVERY DAY   . oxybutynin (DITROPAN XL) 15 MG 24 hr tablet Take 15 mg by mouth at bedtime.   . [DISCONTINUED] hydrochlorothiazide (MICROZIDE) 12.5 MG capsule TAKE 1 CAPSULE BY MOUTH EVERY DAY   . [DISCONTINUED] lubiprostone (AMITIZA) 8 MCG capsule Take 1 capsule (8 mcg total) by mouth 2 (two) times daily with a meal. (Patient not taking: Reported on 08/06/2019)    No facility-administered encounter medications on file as of 08/20/2019.     Objective:   Goals Addressed            This Visit's Progress     Patient Stated   . I need to manage my chronic constipation (pt-stated)       Current Barriers:   . Financial . Self-manages medications.  Uses pill bottles from pharmacy . Chronic Constipation o Patient has tried and failed miralax, colace and stimulant laxatives o He has been dealing with constipation for years and it has vastly affected his quality of life o He states the only medication that works is Linzess 124mg (costly). He cannot afford this medication out of pocket.  He has been obtaining samples from PCP  Pharmacist Clinical Goal(s):  .Marland KitchenOver the next 90 days, patient will work with PharmD and provider towards optimized medication management . Over the next 30 days, patient will work with PharmD and PCP to obtain patient assistance for Linzess  Interventions: . Comprehensive medication review performed; medication list updated in electronic medical record . Counseled patient on diet & lifestyle modifications for patients with constipation o Exercised as able o Increase water intake o Continue to take medications as prescribed o Prepared [employer] silver scripts tier exception for Linzess to see if we can reduce copay ($155/month) o PCP to sign if she agrees with plan.  Patient was denied for patient assistance due to income. - [facility]02[facility]19 [facility]  Will follow up in 30 days. - Amitiza copay $128--pt unable to afford.   - 08/20/2019-All methods to retrieve lower copays have been denied--patient will have to pay out of pocket and continue to get samples.  Patient Self Care Activities:  . Patient will take medications as prescribed . Patient will focus  on improved adherence by applying for financial assistance to obtain medication  Please see past updates related to this goal by clicking on the "Past Updates" button in the selected goal      . I would like to control my blood pressure (pt-stated)       Current Barriers:  . Uncontrolled hypertension, complicated by HLD, CHF . Current antihypertensive regimen: lisinopril, HCTZ . Previous  antihypertensives tried: amlodipine (d/c'd due to patient preference/liver) . Current home BP readings: 120s/70-80s . Most recent eGFR/CrCl 76 (Scr 1.22)  Pharmacist Clinical Goal(s):  Marland Kitchen Over the next 90 days, patient will work with PharmD and providers to optimize antihypertensive regimen  Interventions: . Comprehensive medication review performed; medication list updated in the electronic medical record.  . Reviewed purpose and side effects--patient denies adverse events . BP controlled-continue current management . Patient denies chest pain, however could benefit from nitrates PRN at home . Will continue to follow  Patient Self Care Activities:  . Patient will continue to check BP 3x weekly or if symptomatic, document, and provide at future appointments . Patient will focus on medication adherence  Please see past updates related to this goal by clicking on the "Past Updates" button in the selected goal          Plan:   The care management team will reach out to the patient again over the next 60 days.   Provider Signature Regina Eck, PharmD, BCPS Clinical Pharmacist, [facility] [place]: [phone]

## 2019-08-28 ENCOUNTER — Ambulatory Visit: Payer: Medicare Other | Attending: Internal Medicine

## 2019-08-28 DIAGNOSIS — Z23 Encounter for immunization: Secondary | ICD-10-CM | POA: Insufficient documentation

## 2019-08-28 NOTE — Progress Notes (Signed)
   Covid-19 Vaccination Clinic  Name:  Kevin Weiss    MRN: [id number] DOB: September 19, 1940  08/28/2019  Kevin Weiss was observed post Covid-19 immunization for 15 minutes without incidence. He was provided with Vaccine Information Sheet and instruction to access the V-Safe system.   Kevin Weiss was instructed to call 911 with any severe reactions post vaccine: Marland Kitchen Difficulty breathing  . Swelling of your face and throat  . A fast heartbeat  . A bad rash all over your body  . Dizziness and weakness    Immunizations Administered    Name Date Dose VIS Date Route   Pfizer COVID-19 Vaccine 08/28/2019  9:25 AM 0.3 mL 07/27/2019 Intramuscular   Manufacturer: [employer]   Lot: [id number]   [place]: [id number]

## 2019-09-17 ENCOUNTER — Ambulatory Visit: Payer: Medicare HMO | Attending: Internal Medicine

## 2019-09-17 ENCOUNTER — Telehealth: Payer: Self-pay

## 2019-09-17 DIAGNOSIS — Z23 Encounter for immunization: Secondary | ICD-10-CM

## 2019-09-17 NOTE — Telephone Encounter (Signed)
LVM for pt to call the office to have upcoming appt changed due to provider schedule

## 2019-09-17 NOTE — Progress Notes (Signed)
   Covid-19 Vaccination Clinic  Name:  Kevin Weiss    MRN: [id number] DOB: 1941/06/22  09/17/2019  Kevin Weiss was observed post Covid-19 immunization for 15 minutes without incidence. He was provided with Vaccine Information Sheet and instruction to access the V-Safe system.   Kevin Weiss was instructed to call 911 with any severe reactions post vaccine: Marland Kitchen Difficulty breathing  . Swelling of your face and throat  . A fast heartbeat  . A bad rash all over your body  . Dizziness and weakness    Immunizations Administered    Name Date Dose VIS Date Route   Pfizer COVID-19 Vaccine 09/17/2019  9:08 AM 0.3 mL 07/27/2019 Intramuscular   Manufacturer: [place]   Lot: [id number]   [place]: [id number]

## 2019-09-21 ENCOUNTER — Telehealth: Payer: Self-pay

## 2019-09-27 ENCOUNTER — Ambulatory Visit: Payer: Medicare HMO | Admitting: Internal Medicine

## 2019-10-03 ENCOUNTER — Telehealth: Payer: Self-pay

## 2019-10-03 NOTE — Telephone Encounter (Signed)
ATT TO CONTACT PT TO ADVISE APPT CANCELLED DUE TO WEATHER NO ANS LVM THAT APPT HAS BEEN RESCHEDULED

## 2019-10-04 ENCOUNTER — Ambulatory Visit: Payer: Medicare HMO | Admitting: Internal Medicine

## 2019-10-05 ENCOUNTER — Other Ambulatory Visit: Payer: Self-pay | Admitting: Internal Medicine

## 2019-10-10 ENCOUNTER — Other Ambulatory Visit: Payer: Self-pay

## 2019-10-10 ENCOUNTER — Ambulatory Visit (INDEPENDENT_AMBULATORY_CARE_PROVIDER_SITE_OTHER): Payer: Medicare HMO | Admitting: Internal Medicine

## 2019-10-10 VITALS — BP 122/76 | HR 86 | Temp 98.4°F | Ht 68.0 in | Wt 250.0 lb

## 2019-10-10 DIAGNOSIS — E669 Obesity, unspecified: Secondary | ICD-10-CM

## 2019-10-10 DIAGNOSIS — I5032 Chronic diastolic (congestive) heart failure: Secondary | ICD-10-CM

## 2019-10-10 DIAGNOSIS — Z6838 Body mass index (BMI) 38.0-38.9, adult: Secondary | ICD-10-CM | POA: Diagnosis not present

## 2019-10-10 DIAGNOSIS — R7303 Prediabetes: Secondary | ICD-10-CM | POA: Diagnosis not present

## 2019-10-10 DIAGNOSIS — I10 Essential (primary) hypertension: Secondary | ICD-10-CM

## 2019-10-10 NOTE — Progress Notes (Signed)
This visit occurred during the SARS-CoV-2 public health emergency.  Safety protocols were in place, including screening questions prior to the visit, additional usage of staff PPE, and extensive cleaning of exam room while observing appropriate contact time as indicated for disinfecting solutions.  Subjective:     Patient ID: Kevin Weiss , male    DOB: 09-01-1940 , 79 y.o.   MRN: [id number]   Chief Complaint  Patient presents with  . Hypertension    HPI Pt is here for FU HTN He does not check his BP, and has a  He not been able to qualify for linzess help, and needs more samples. This is the only thing that has helped, although since exercising today he noticed had a BM just fine.  Past Medical History:  Diagnosis Date  . Arthritis   . Cardiomegaly 10/13/2006   Qualifier: Diagnosis of  By: Eusebio Friendly    . CHF, EJECTION FRACTION > OR = 50% 10/13/2006   Qualifier: Diagnosis of  By: Eusebio Friendly    . Degenerative joint disease (DJD) of hip 03/12/2014  . GASTROESOPHAGEAL REFLUX, NO ESOPHAGITIS 10/13/2006   patient denies  . HEMORRHOIDS, NOS 10/13/2006   Qualifier: Diagnosis of  By: Eusebio Friendly    . History of colon polyps   . Hypertension    takes Amlodipine and Prinizide daily  . 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 Morbid obesity ([place]) 11/03/2015  . OSA on CPAP 11/03/2015  . Retrognathia 11/03/2015     Family History  Problem Relation Age of Onset  . Hypertension Mother   . Hyperlipidemia Mother   . Alzheimer's disease Mother   . Heart disease Father   . Hyperlipidemia Father   . Hypertension Father   . Hypertension Sister   . Hypertension Brother   . Heart attack Maternal Grandmother      Current Outpatient Medications:  .  aspirin 81 MG tablet, Take 81 mg by mouth daily., Disp: , Rfl:  .  Cholecalciferol (VITAMIN D-3) 1000 UNITS CAPS, Take 1 capsule by mouth daily., Disp: , Rfl:  .  ferrous sulfate  325 (65 FE) MG tablet, One qd, Disp: 1 tablet, Rfl: 0 .  Flaxseed, Linseed, (FLAXSEED OIL) 1000 MG CAPS, Take by mouth., Disp: , Rfl:  .  Garlic (GARLIQUE PO), Take by mouth., Disp: , Rfl:  .  hydrochlorothiazide (MICROZIDE) 12.5 MG capsule, TAKE 1 CAPSULE BY MOUTH EVERY DAY, Disp: 90 capsule, Rfl: 0 .  lisinopril (ZESTRIL) 10 MG tablet, TAKE 1 TABLET BY MOUTH EVERY DAY, Disp: 90 tablet, Rfl: 0 .  oxybutynin (DITROPAN XL) 15 MG 24 hr tablet, Take 15 mg by mouth at bedtime., Disp: , Rfl:  .  linaclotide (LINZESS) 145 MCG CAPS capsule, Take 145 mcg by mouth daily before breakfast., Disp: , Rfl:    No Known Allergies   Review of Systems  Review of Systems  Constitutional: Negative for diaphoresis and unexpected weight change.  HENT: Negative for tinnitus.   Eyes: Negative for visual disturbance.  Respiratory: Negative for chest tightness and shortness of breath.   Cardiovascular: Negative for chest pain, palpitations and leg swelling.  Gastrointestinal: Negative for constipation, diarrhea and nausea.  Endocrine: Negative for polydipsia, polyphagia and polyuria.  Genitourinary: Negative for dysuria and frequency.  Skin: Negative for rash and wound.  Neurological: Negative for dizziness, speech difficulty, weakness, numbness and headaches.   Today's Vitals   10/10/19 1126  BP: 122/76  Pulse: 86  Temp: 98.4 F (36.9 C)  TempSrc: Oral  Weight: 250 lb (113.4 kg)  Height: 5\' 8"  (1.727 m)   Body mass index is 38.01 kg/m.   Objective:  Physical Exam  Constitutional: he is oriented to person, place, and time. he appears well-developed and well-nourished. No distress.  HENT:  Head: Normocephalic and atraumatic.  Right Ear: External ear normal.  Left Ear: External ear normal.  Nose: Nose normal.  Eyes: Conjunctivae are normal. Right eye exhibits no discharge. Left eye exhibits no discharge. No scleral icterus.  Neck: Neck supple. No thyromegaly present.  No carotid bruits bilaterally   Cardiovascular: Normal rate and regular rhythm.  No murmur heard. Pulmonary/Chest: Effort normal and breath sounds normal. No respiratory distress.  Musculoskeletal: Normal range of motion. he exhibits no edema.  Lymphadenopathy:    he has no cervical adenopathy.  Neurological: he is alert and oriented to person, place, and time.  Skin: Skin is warm and dry. Capillary refill takes less than 2 seconds. No rash noted. he is not diaphoretic.  Psychiatric: She has a normal mood and affect. His behavior is normal. Judgment and thought content normal.  Nursing note reviewed.  Assessment And Plan:    1. Essential hypertension- stable. May continue current medication. FU in 3 months.  - CMP14 + Anion Gap - CBC no Diff  2. Prediabetes- chronic. Will continue low carb diet.  - Hemoglobin A1c  3. Obesity (BMI 35.0-39.9 without comorbidity)- chronic. He has started exercising, hopefully this will help him loose some wt.     Markia Kyer RODRIGUEZ-SOUTHWORTH, PA-C    THE PATIENT IS ENCOURAGED TO PRACTICE SOCIAL DISTANCING DUE TO THE COVID-19 PANDEMIC.

## 2019-10-11 LAB — CMP14 + ANION GAP
ALT: 23 IU/L (ref 0–44)
AST: 17 IU/L (ref 0–40)
Albumin/Globulin Ratio: 1.9 (ref 1.2–2.2)
Albumin: 4.8 g/dL — ABNORMAL HIGH (ref 3.7–4.7)
Alkaline Phosphatase: 222 IU/L — ABNORMAL HIGH (ref 39–117)
Anion Gap: 17 mmol/L (ref 10.0–18.0)
BUN/Creatinine Ratio: 13 (ref 10–24)
BUN: 17 mg/dL (ref 8–27)
Bilirubin Total: 0.4 mg/dL (ref 0.0–1.2)
CO2: 21 mmol/L (ref 20–29)
Calcium: 9.7 mg/dL (ref 8.6–10.2)
Chloride: 101 mmol/L (ref 96–106)
Creatinine, Ser: 1.3 mg/dL — ABNORMAL HIGH (ref 0.76–1.27)
GFR calc Af Amer: 61 mL/min/{1.73_m2} (ref >59)
GFR calc non Af Amer: 53 mL/min/{1.73_m2} — ABNORMAL LOW (ref >59)
Globulin, Total: 2.5 g/dL (ref 1.5–4.5)
Glucose: 92 mg/dL (ref 65–99)
Potassium: 4.3 mmol/L (ref 3.5–5.2)
Sodium: 139 mmol/L (ref 134–144)
Total Protein: 7.3 g/dL (ref 6.0–8.5)

## 2019-10-11 LAB — HEMOGLOBIN A1C
Est. average glucose Bld gHb Est-mCnc: 117 mg/dL
Hgb A1c MFr Bld: 5.7 % — ABNORMAL HIGH (ref 4.8–5.6)

## 2019-10-11 LAB — CBC
Hematocrit: 46 % (ref 37.5–51.0)
Hemoglobin: 15.7 g/dL (ref 13.0–17.7)
MCH: 28.2 pg (ref 26.6–33.0)
MCHC: 34.1 g/dL (ref 31.5–35.7)
MCV: 83 fL (ref 79–97)
Platelets: 220 10*3/uL (ref 150–450)
RBC: 5.57 x10E6/uL (ref 4.14–5.80)
RDW: 13.4 % (ref 11.6–15.4)
WBC: 5.5 10*3/uL (ref 3.4–10.8)

## 2019-10-15 ENCOUNTER — Telehealth: Payer: Self-pay

## 2019-10-15 ENCOUNTER — Encounter: Payer: Self-pay | Admitting: Internal Medicine

## 2019-10-15 NOTE — Telephone Encounter (Signed)
Patient notified of his lab results. YRL,RMA

## 2019-10-15 NOTE — Telephone Encounter (Signed)
Spoke w/pt gave provider message  [place], Sunday Spillers, PA-C  Candiss Norse T, CMA  Please inform pt that his chemistry is stable, cell count is normal, and his diabetes test is still at prediabetes. Needs to continue low carb and no sugar diet.

## 2019-10-24 ENCOUNTER — Telehealth: Payer: Self-pay

## 2019-11-01 ENCOUNTER — Telehealth: Payer: Self-pay

## 2019-11-01 ENCOUNTER — Ambulatory Visit (INDEPENDENT_AMBULATORY_CARE_PROVIDER_SITE_OTHER): Payer: Medicare HMO

## 2019-11-01 ENCOUNTER — Other Ambulatory Visit: Payer: Self-pay

## 2019-11-01 DIAGNOSIS — R7303 Prediabetes: Secondary | ICD-10-CM

## 2019-11-01 DIAGNOSIS — I10 Essential (primary) hypertension: Secondary | ICD-10-CM | POA: Diagnosis not present

## 2019-11-01 DIAGNOSIS — E782 Mixed hyperlipidemia: Secondary | ICD-10-CM

## 2019-11-05 NOTE — Chronic Care Management (AMB) (Signed)
Chronic Care Management   Initial Visit Note  11/02/2019 Name: Kevin Weiss MRN: [id number] DOB: 1941/05/29  Referred by: Shelby Mattocks, PA-C Reason for referral : Chronic Care Management (RQ Initial Call-HTN/HLD/Prediabetes )   Kevin Weiss is a 79 y.o. year old male who is a primary care patient of [place], Sunday Spillers, [place]. The CCM team was consulted for assistance with chronic disease management and care coordination needs related to HTN and Prediabetes, Mixed Hyperlipidemia  Review of patient status, including review of consultants reports, relevant laboratory and other test results, and collaboration with appropriate care team members and the patient's provider was performed as part of comprehensive patient evaluation and provision of chronic care management services.    SDOH (Social Determinants of Health) assessments performed: No See Care Plan activities for detailed interventions related to [place] initial CCM RN CM call to assess for CCM RN needs, a care plan was established.   Medications: Outpatient Encounter Medications as of 11/01/2019  Medication Sig Note  . aspirin 81 MG tablet Take 81 mg by mouth daily.   . Cholecalciferol (VITAMIN D-3) 1000 UNITS CAPS Take 1 capsule by mouth daily.   . ferrous sulfate 325 (65 FE) MG tablet One qd   . Flaxseed, Linseed, (FLAXSEED OIL) 1000 MG CAPS Take by mouth.   . Garlic (GARLIQUE PO) Take by mouth.   . hydrochlorothiazide (MICROZIDE) 12.5 MG capsule TAKE 1 CAPSULE BY MOUTH EVERY DAY   . linaclotide (LINZESS) 145 MCG CAPS capsule Take 145 mcg by mouth daily before breakfast. 08/22/2019: samples  . lisinopril (ZESTRIL) 10 MG tablet TAKE 1 TABLET BY MOUTH EVERY DAY   . oxybutynin (DITROPAN XL) 15 MG 24 hr tablet Take 15 mg by mouth at bedtime.    No facility-administered encounter medications on file as of 11/01/2019.     Objective:  Lab Results  Component Value Date   HGBA1C 5.7 (H)  10/10/2019   Lab Results  Component Value Date   LDLCALC 115 (H) 06/21/2019   CREATININE 1.30 (H) 10/10/2019   BP Readings from Last 3 Encounters:  10/10/19 122/76  08/06/19 131/73  06/21/19 124/76    Goals Addressed      Patient Stated   . I need to manage my chronic constipation (pt-stated)       Current Barriers:  . Financial . Self-manages medications.  Uses pill bottles from pharmacy . Chronic Constipation o Patient has tried and failed miralax, colace and stimulant laxatives o He has been dealing with constipation for years and it has vastly affected his quality of life o He states the only medication that works is Linzess 116mg (costly). He cannot afford this medication out of pocket.  He has been obtaining samples from PCP  Pharmacist Clinical Goal(s):  .Marland KitchenOver the next 90 days, patient will work with PharmD and provider towards optimized medication management Goal Met  . Over the next 30 days, patient will work with PharmD and PCP to obtain patient assistance for Linzess Goal Met   Interventions: . Comprehensive medication review performed; medication list updated in electronic medical record . Counseled patient on diet & lifestyle modifications for patients with constipation o Exercised as able o Increase water intake o Continue to take medications as prescribed o Prepared [employer] silver scripts tier exception for Linzess to see if we can reduce copay ($155/month) o PCP to sign if she agrees with plan.  Patient was denied for patient assistance due to income. - 08/02/19 Tier  exception forms faxed to [facility].  Will follow up in 30 days. - Amitiza copay $128--pt unable to afford.   - 08/20/2019-All methods to retrieve lower copays have been denied--patient will have to pay out of pocket and continue to get samples.  Marland Kitchen CARE PLAN ENTRY . (see longtitudinal plan of care for additional care plan information) . Current Barriers:  Marland Kitchen Knowledge Deficits related to disease  process and Self Health management of Chronic Constipation  . Film/video editor.  . Chronic Disease Management support and education needs related to Chronic Constipation, HTN, Prediabetes, Mixed Hyperlipidemia  . Nurse Case Manager Clinical Goal(s):  Marland Kitchen Over the next 90 days, patient will work with the CCM team and PCP to address needs related to disease education and support to help improve patient's Self Health management of chronic constipation  CCM RN CM Interventions:  11/01/19 call completed with patient . Evaluation of current treatment plan related to Chronic Constipation and patient's adherence to plan as established by provider. . Provided education to patient re: disease process related Chronic Constipation, Self Health management and pharmacological recommendations . Reviewed medications with patient and discussed patient has used Linzess for treatment of Chronic Constipation with good effectiveness, he continues to have financial hardship paying this medication and rely's on PCP for samples; Determined patient has worked with the embedded Pharm D to obtain financial assistance with the cost of this drug; Per review of the Pharm D's update, there is no financial assistance available to assist with the cost of this drug, Kevin Weiss will need to rely on samples from the PCP or pay out of pocket for this medication . Discussed plans with patient for ongoing care management follow up and provided patient with direct contact information for care management team . Provided patient with printed educational materials related to Chronic Constipation   Patient Self Care Activities:  . Patient will take medications as prescribed . Patient will focus on improved adherence by applying for financial assistance to obtain medication  Please see past updates related to this goal by clicking on the "Past Updates" button in the selected goal      . I would like to control my blood pressure  (pt-stated)       Current Barriers:  . Uncontrolled hypertension, complicated by HLD, CHF . Current antihypertensive regimen: lisinopril, HCTZ . Previous antihypertensives tried: amlodipine (d/c'd due to patient preference/liver) . Current home BP readings: 120s/70-80s . Most recent eGFR/CrCl 76 (Scr 1.22)  Pharmacist Clinical Goal(s):  Marland Kitchen Over the next 90 days, patient will work with the CCM team and PCP to optimize antihypertensive regimen  Interventions: . Comprehensive medication review performed; medication list updated in the electronic medical record.  . Reviewed purpose and side effects--patient denies adverse events . BP controlled-continue current management . Patient denies chest pain, however could benefit from nitrates PRN at home . Will continue to follow  CARE PLAN ENTRY (see longtitudinal plan of care for additional care plan information)  Current Barriers:  Marland Kitchen Knowledge Deficits related to disease process and Self Health management of HTN . Chronic Disease Management support and education needs related to HTN, Prediabetes, Mixed Hyperlipidemia  Nurse Case Manager Clinical Goal(s):  Marland Kitchen Over the next 90 days, patient will work with the CCM team and PCP to address needs related to disease education and support to improve Self Health management of HTN  CCM RN CM Interventions:  11/01/19 call completed with patient  . Evaluation of current treatment plan related to HTN  and patient's adherence to plan as established by provider. . Provided education to patient re: disease process and Self Health management of HTN, including adherence to diet, exercise and medication management as prescribed; Determined patient is at goal BP and is able to Self monitor his BP at home; educated on target BP ,130/80  . Reviewed medications with patient and discussed patient is adhering to his prescribed medication regimen w/o noted SE  . Discussed plans with patient for ongoing care management  follow up and provided patient with direct contact information for care management team . Advised patient, providing education and rationale, to monitor blood pressure daily and record, calling the CCM team and or PCP for findings outside established parameters.  . Provided patient with printed educational materials related to What is High Blood Pressure?; African Americans and High Blood Pressure; Why Should I Lower Sodium?; High Blood Pressure and Stroke; Life's Simple 7  Patient Self Care Activities:  . Patient will continue to check BP 3x weekly or if symptomatic, document, and provide at future appointments . Patient will focus on medication adherence  Please see past updates related to this goal by clicking on the "Past Updates" button in the selected goal        Other   . COMPLETED: Assist with Chronic Care Management and Care Coordination needs       Current Barriers:  Marland Kitchen Knowledge Barriers related to resources and support available to address needs related to Chronic disease management and Pharmacy resource needs  Case Manager Clinical Goal(s):  Marland Kitchen Over the next 30 days, patient will work with the CCM team to address needs related to Chronic disease Management and Pharmacy resource needs  Interventions:  . Collaborated with BSW and initiated plan of care to address needs related to Pharmacy resource needs; assess for CCM RNCM needs  Patient Self Care Activities:  . Attends all scheduled provider appointments . Calls provider office for new concerns or questions  Initial goal documentation        Plan:   Telephone follow up appointment with care management team member scheduled for: 11/09/19  Barb Merino, RN, BSN, CCM Care Management Coordinator [facility]  Direct Phone: [phone]

## 2019-11-05 NOTE — Patient Instructions (Signed)
Visit Information  Goals Addressed      Patient Stated   . I need to manage my chronic constipation (pt-stated)       Current Barriers:  . Financial . Self-manages medications.  Uses pill bottles from pharmacy . Chronic Constipation o Patient has tried and failed miralax, colace and stimulant laxatives o He has been dealing with constipation for years and it has vastly affected his quality of life o He states the only medication that works is Linzess 16mg (costly). He cannot afford this medication out of pocket.  He has been obtaining samples from PCP  Pharmacist Clinical Goal(s):  .Marland KitchenOver the next 90 days, patient will work with PharmD and provider towards optimized medication management Goal Met  . Over the next 30 days, patient will work with PharmD and PCP to obtain patient assistance for Linzess Goal Met   Interventions: . Comprehensive medication review performed; medication list updated in electronic medical record . Counseled patient on diet & lifestyle modifications for patients with constipation o Exercised as able o Increase water intake o Continue to take medications as prescribed o Prepared [employer] silver scripts tier exception for Linzess to see if we can reduce copay ($155/month) o PCP to sign if she agrees with plan.  Patient was denied for patient assistance due to income. - 08[facility]02[facility]19 [facility]  Will follow up in 30 days. - A[facility] copay $128--pt unable to afford.   - 08/20/2019-All methods to retrieve lower copays have been denied--patient will have to pay out of pocket and continue to get samples.  .Marland KitchenCARE PLAN ENTRY . (see longtitudinal plan of care for additional care plan information) . Current Barriers:  .Marland KitchenKnowledge Deficits related to disease process and Self Health management of Chronic Constipation  . FFilm/video editor  . Chronic Disease Management support and education needs related to Chronic Constipation, HTN, Prediabetes,  Mixed Hyperlipidemia  . Nurse Case Manager Clinical Goal(s):  .Marland KitchenOver the next 90 days, patient will work with the CCM team and PCP to address needs related to disease education and support to help improve patient's Self Health management of chronic constipation  CCM RN CM Interventions:  11/01/19 call completed with patient . Evaluation of current treatment plan related to Chronic Constipation and patient's adherence to plan as established by provider. . Provided education to patient re: disease process related Chronic Constipation, Self Health management and pharmacological recommendations . Reviewed medications with patient and discussed patient has used Linzess for treatment of Chronic Constipation with good effectiveness, he continues to have financial hardship paying this medication and rely's on PCP for samples; Determined patient has worked with the embedded Pharm D to obtain financial assistance with the cost of this drug; Per review of the Pharm D's update, there is no financial assistance available to assist with the cost of this drug, Mr. HKlinkewill need to rely on samples from the PCP or pay out of pocket for this medication . Discussed plans with patient for ongoing care management follow up and provided patient with direct contact information for care management team . Provided patient with printed educational materials related to Chronic Constipation   Patient Self Care Activities:  . Patient will take medications as prescribed . Patient will focus on improved adherence by applying for financial assistance to obtain medication  Please see past updates related to this goal by clicking on the "Past Updates" button in the selected goal      . I  would like to control my blood pressure (pt-stated)       Current Barriers:  . Uncontrolled hypertension, complicated by HLD, CHF . Current antihypertensive regimen: lisinopril, HCTZ . Previous antihypertensives tried: amlodipine (d/c'd  due to patient preference/liver) . Current home BP readings: 120s/70-80s . Most recent eGFR/CrCl 76 (Scr 1.22)  Pharmacist Clinical Goal(s):  Marland Kitchen Over the next 90 days, patient will work with the CCM team and PCP to optimize antihypertensive regimen  Interventions: . Comprehensive medication review performed; medication list updated in the electronic medical record.  . Reviewed purpose and side effects--patient denies adverse events . BP controlled-continue current management . Patient denies chest pain, however could benefit from nitrates PRN at home . Will continue to follow  CARE PLAN ENTRY (see longtitudinal plan of care for additional care plan information)  Current Barriers:  Marland Kitchen Knowledge Deficits related to disease process and Self Health management of HTN . Chronic Disease Management support and education needs related to HTN, Prediabetes, Mixed Hyperlipidemia  Nurse Case Manager Clinical Goal(s):  Marland Kitchen Over the next 90 days, patient will work with the CCM team and PCP to address needs related to disease education and support to improve Self Health management of HTN  CCM RN CM Interventions:  11/01/19 call completed with patient  . Evaluation of current treatment plan related to HTN and patient's adherence to plan as established by provider. . Provided education to patient re: disease process and Self Health management of HTN, including adherence to diet, exercise and medication management as prescribed; Determined patient is at goal BP and is able to Self monitor his BP at home; educated on target BP ,130/80  . Reviewed medications with patient and discussed patient is adhering to his prescribed medication regimen w/o noted SE  . Discussed plans with patient for ongoing care management follow up and provided patient with direct contact information for care management team . Advised patient, providing education and rationale, to monitor blood pressure daily and record, calling the CCM  team and or PCP for findings outside established parameters.  . Provided patient with printed educational materials related to What is High Blood Pressure?; African Americans and High Blood Pressure; Why Should I Lower Sodium?; High Blood Pressure and Stroke; Life's Simple 7  Patient Self Care Activities:  . Patient will continue to check BP 3x weekly or if symptomatic, document, and provide at future appointments . Patient will focus on medication adherence  Please see past updates related to this goal by clicking on the "Past Updates" button in the selected goal        Other   . COMPLETED: Assist with Chronic Care Management and Care Coordination needs       Current Barriers:  Marland Kitchen Knowledge Barriers related to resources and support available to address needs related to Chronic disease management and Pharmacy resource needs  Case Manager Clinical Goal(s):  Marland Kitchen Over the next 30 days, patient will work with the CCM team to address needs related to Chronic disease Management and Pharmacy resource needs  Interventions:  . Collaborated with BSW and initiated plan of care to address needs related to Pharmacy resource needs; assess for CCM RNCM needs  Patient Self Care Activities:  . Attends all scheduled provider appointments . Calls provider office for new concerns or questions  Initial goal documentation        Patient verbalizes understanding of instructions provided today.   Telephone follow up appointment with care management team member scheduled for:11/09/19  Barb Merino,  RN, BSN, [place] Management Coordinator [place] [facility]  Direct Phone: [phone]

## 2019-11-09 ENCOUNTER — Telehealth: Payer: Self-pay

## 2019-11-13 ENCOUNTER — Other Ambulatory Visit: Payer: Self-pay | Admitting: Internal Medicine

## 2019-11-13 DIAGNOSIS — I10 Essential (primary) hypertension: Secondary | ICD-10-CM

## 2019-12-11 ENCOUNTER — Other Ambulatory Visit: Payer: Self-pay | Admitting: Nurse Practitioner

## 2019-12-11 DIAGNOSIS — D508 Other iron deficiency anemias: Secondary | ICD-10-CM

## 2019-12-17 ENCOUNTER — Telehealth: Payer: Self-pay

## 2019-12-17 NOTE — Telephone Encounter (Signed)
Patient called in and wanted to confirm dosage of ferrous sulfate ([facility]) 325 (65 FE) MG tablet he states that he discussed taking medication once a day , he states he will feel more comfortable taking once daily. Please advise.

## 2019-12-29 ENCOUNTER — Other Ambulatory Visit: Payer: Self-pay | Admitting: Internal Medicine

## 2020-01-02 ENCOUNTER — Telehealth: Payer: Self-pay

## 2020-02-06 ENCOUNTER — Other Ambulatory Visit: Payer: Self-pay

## 2020-02-06 ENCOUNTER — Telehealth: Payer: Self-pay

## 2020-02-06 ENCOUNTER — Ambulatory Visit (INDEPENDENT_AMBULATORY_CARE_PROVIDER_SITE_OTHER): Payer: Medicare HMO

## 2020-02-06 DIAGNOSIS — I10 Essential (primary) hypertension: Secondary | ICD-10-CM | POA: Diagnosis not present

## 2020-02-06 DIAGNOSIS — E782 Mixed hyperlipidemia: Secondary | ICD-10-CM | POA: Diagnosis not present

## 2020-02-06 DIAGNOSIS — K5900 Constipation, unspecified: Secondary | ICD-10-CM

## 2020-02-06 DIAGNOSIS — R7303 Prediabetes: Secondary | ICD-10-CM

## 2020-02-10 ENCOUNTER — Other Ambulatory Visit: Payer: Self-pay | Admitting: Internal Medicine

## 2020-02-10 DIAGNOSIS — I10 Essential (primary) hypertension: Secondary | ICD-10-CM

## 2020-02-11 NOTE — Patient Instructions (Addendum)
Visit Information  Goals Addressed      Patient Stated   .  "to improve my chronic constipation" (pt-stated)        CARE PLAN ENTRY (see longitudinal plan of care for additional care plan information)  Current Barriers:  Marland Kitchen Knowledge Deficits related to disease process and Self Health management of Chronic Constipation  . Chronic Disease Management support and education needs related to Prediabetes, HTN, Mixed Hyperlipidemia, Constipation, unspecified  Nurse Case Manager Clinical Goal(s):  Marland Kitchen Over the next 90 days, patient will work with the CCM team and PCP to address needs related to disease education and support to improve Self Health management of Chronic Constipation   CCM RN CM Interventions:  02/07/20 call completed with patient . Inter-disciplinary care team collaboration (see longitudinal plan of care) . Evaluation of current treatment plan related to Chronic Constipation and patient's adherence to plan as established by provider. . Provided education to patient re: disease process related to Chronic Constipation and ways to improve Self management this condition  . Reviewed medications with patient and discussed patient is getting good effectiveness from [place], he continues to have financial hardship paying for this medication, he rely's on getting samples from the PCP office; discussed patient is having BM every few days; patient would like to work with embedded Pharm D for support of medication management in support of his chronic conditions  . Discussed plans with patient for ongoing care management follow up and provided patient with direct contact information for care management team . Provided patient with printed educational materials related to Elderly Nutrition 101 . Pharmacy referral for embedded Pharm D   Patient Self Care Activities:  . Self administers medications as prescribed . Attends all scheduled provider appointments . Calls pharmacy for medication  refills . Performs ADL's independently . Performs IADL's independently . Calls provider office for new concerns or questions  Initial goal documentation     .  COMPLETED: I need to manage my chronic constipation (pt-stated)        Current Barriers:  . Financial . Self-manages medications.  Uses pill bottles from pharmacy . Chronic Constipation o Patient has tried and failed miralax, colace and stimulant laxatives o He has been dealing with constipation for years and it has vastly affected his quality of life o He states the only medication that works is Linzess 165mg (costly). He cannot afford this medication out of pocket.  He has been obtaining samples from PCP  Pharmacist Clinical Goal(s):  .Marland KitchenOver the next 90 days, patient will work with PharmD and provider towards optimized medication management Goal Met  . Over the next 30 days, patient will work with PharmD and PCP to obtain patient assistance for Linzess Goal Met   Interventions: . Comprehensive medication review performed; medication list updated in electronic medical record . Counseled patient on diet & lifestyle modifications for patients with constipation o Exercised as able o Increase water intake o Continue to take medications as prescribed o Prepared [employer] silver scripts tier exception for Linzess to see if we can reduce copay ($155/month) o PCP to sign if she agrees with plan.  Patient was denied for patient assistance due to income. - [facility]02[facility]  Will follow up in 30 days. - [place] copay $128--pt unable to afford.   - 08/20/2019-All methods to retrieve lower copays have been denied--patient will have to pay out of pocket and continue to get samples.  .Marland KitchenCARE PLAN ENTRY . (  see longtitudinal plan of care for additional care plan information) . Current Barriers:  Marland Kitchen Knowledge Deficits related to disease process and Self Health management of Chronic Constipation  . Museum/gallery exhibitions officer.  . Chronic Disease Management support and education needs related to Chronic Constipation, HTN, Prediabetes, Mixed Hyperlipidemia  . Nurse Case Manager Clinical Goal(s):  Marland Kitchen Over the next 90 days, patient will work with the CCM team and PCP to address needs related to disease education and support to help improve patient's Self Health management of chronic constipation  CCM RN CM Interventions:  11/01/19 call completed with patient . Evaluation of current treatment plan related to Chronic Constipation and patient's adherence to plan as established by provider. . Provided education to patient re: disease process related Chronic Constipation, Self Health management and pharmacological recommendations . Reviewed medications with patient and discussed patient has used Linzess for treatment of Chronic Constipation with good effectiveness, he continues to have financial hardship paying this medication and rely's on PCP for samples; Determined patient has worked with the embedded Pharm D to obtain financial assistance with the cost of this drug; Per review of the Pharm D's update, there is no financial assistance available to assist with the cost of this drug, Mr. Dinovo will need to rely on samples from the PCP or pay out of pocket for this medication . Discussed plans with patient for ongoing care management follow up and provided patient with direct contact information for care management team . Provided patient with printed educational materials related to Chronic Constipation   Patient Self Care Activities:  . Patient will take medications as prescribed . Patient will focus on improved adherence by applying for financial assistance to obtain medication  Please see past updates related to this goal by clicking on the "Past Updates" button in the selected goal      .  I would like to control my blood pressure (pt-stated)        Current Barriers:  . Uncontrolled hypertension,  complicated by HLD, CHF . Current antihypertensive regimen: lisinopril, HCTZ . Previous antihypertensives tried: amlodipine (d/c'd due to patient preference/liver) . Current home BP readings: 120s/70-80s . Most recent eGFR/CrCl 76 (Scr 1.22)  Pharmacist Clinical Goal(s):  Marland Kitchen Over the next 90 days, patient will work with the CCM team and PCP to optimize antihypertensive regimen Goal Met   (see longtitudinal plan of care for additional care plan information)  Current Barriers:  Marland Kitchen Knowledge Deficits related to disease process and Self Health management of HTN . Chronic Disease Management support and education needs related to HTN, Prediabetes, Mixed Hyperlipidemia  Nurse Case Manager Clinical Goal(s):  Marland Kitchen New 02/07/20 Over the next 90 days, patient will work with the CCM team and PCP to address needs related to disease education and support to improve Self Health management of HTN  CCM RN CM Interventions:  02/07/20 call completed with patient  . Evaluation of current treatment plan related to HTN and patient's adherence to plan as established by provider. . Provided education to patient re: disease process and Self Health management of HTN, including adherence to diet, exercise and medication management as prescribed; Determined patient is at goal BP and is able to Self monitor his BP at home; educated on target BP ,130/80  . Reviewed medications with patient and discussed patient is adhering to his prescribed medication regimen w/o noted SE  . Discussed plans with patient for ongoing care management follow up and provided patient with direct contact information for  care management team . Advised patient, providing education and rationale, to monitor blood pressure daily and record, calling the CCM team and or PCP for findings outside established parameters.  . Provided patient with printed educational materials related to What is High Blood Pressure?; BP Log; Why Should I Lower Sodium?;  Elderly Nutrition 101  Patient Self Care Activities:  . Patient will continue to check BP 3x weekly or if symptomatic, document, and provide at future appointments . Patient will focus on medication adherence  Please see past updates related to this goal by clicking on the "Past Updates" button in the selected goal        Patient verbalizes understanding of instructions provided today.   Telephone follow up appointment with care management team member scheduled for: 04/03/20  Barb Merino, RN, BSN, CCM Care Management Coordinator [place] [facility]  Direct Phone: [phone]

## 2020-02-11 NOTE — Chronic Care Management (AMB) (Signed)
Chronic Care Management   Follow Up Note   02/07/2020 Name: Kevin Weiss MRN: [id number] DOB: 1940/09/10  Referred by: Shelby Mattocks, PA-C Reason for referral : Chronic Care Management (FU Call-Constipation/Linzess )   Kevin Weiss is a 79 y.o. year old male who is a primary care patient of [place], Sunday [place]ill[place], [place]. The CCM team was consulted for assistance with chronic disease management and care coordination needs.    Review of patient status, including review of consultants reports, relevant laboratory and other test results, and collaboration with appropriate care team members and the patient's provider was performed as part of comprehensive patient evaluation and provision of chronic care management services.    SDOH (Social Determinants of Health) assessments performed: Yes - no new acute challenges See Care Plan activities for detailed interventions related to [place])   Placed outbound CCM RN CM follow up care plan call to patient.     Outpatient Encounter Medications as of 02/06/2020  Medication Sig Note  . aspirin 81 MG tablet Take 81 mg by mouth daily.   . Cholecalciferol (VITAMIN D-3) 1000 UNITS CAPS Take 1 capsule by mouth daily.   . ferrous sulfate ([facility] IRON) 325 (65 FE) MG tablet TAKE 1 TABLET (325 MG TOTAL) BY MOUTH 3 (THREE) TIMES DAILY WITH MEALS.   Marland Kitchen Flaxseed, Linseed, (FLAXSEED OIL) 1000 MG CAPS Take by mouth.   . Garlic (GARLIQUE PO) Take by mouth.   . linaclotide (LINZESS) 145 MCG CAPS capsule Take 145 mcg by mouth daily before breakfast. 08/22/2019: samples  . lisinopril (ZESTRIL) 10 MG tablet TAKE 1 TABLET BY MOUTH EVERY DAY   . oxybutynin (DITROPAN XL) 15 MG 24 hr tablet Take 15 mg by mouth at bedtime.   . [DISCONTINUED] hydrochlorothiazide (MICROZIDE) 12.5 MG capsule TAKE 1 CAPSULE BY MOUTH EVERY DAY    No facility-administered encounter medications on file as of 02/06/2020.     Objective:  Lab Results  Component Value Date     HGBA1C 5.7 (H) 10/10/2019   Lab Results  Component Value Date   LDLCALC 115 (H) 06/21/2019   CREATININE 1.30 (H) 10/10/2019   BP Readings from Last 3 Encounters:  10/10/19 122/76  08/06/19 131/73  06/21/19 124/76    Goals Addressed      Patient Stated   .  "to improve my chronic constipation" (pt-stated)        [place] (see longitudinal plan of care for additional care plan information)  Current Barriers:  Marland Kitchen Knowledge Deficits related to disease process and Self Health management of Chronic Constipation  . Chronic Disease Management support and education needs related to Prediabetes, HTN, Mixed Hyperlipidemia, Constipation, unspecified  Nurse Case Manager Clinical Goal(s):  Marland Kitchen Over the next 90 days, patient will work with the CCM team and PCP to address needs related to disease education and support to improve Self Health management of Chronic Constipation   CCM RN CM Interventions:  02/07/20 call completed with patient . Inter-disciplinary care team collaboration (see longitudinal plan of care) . Evaluation of current treatment plan related to Chronic Constipation and patient's adherence to plan as established by provider. . Provided education to patient re: disease process related to Chronic Constipation and ways to improve Self management this condition  . Reviewed medications with patient and discussed patient is getting good effectiveness from [facility], he continues to have financial hardship paying for this medication, he rely's on getting samples from the PCP office; discussed patient is having BM every few days;  patient would like to work with embedded Pharm D for support of medication management in support of his chronic conditions  . Discussed plans with patient for ongoing care management follow up and provided patient with direct contact information for care management team . Provided patient with printed educational materials related to Elderly Nutrition  101 . Pharmacy referral for embedded Pharm D   Patient Self Care Activities:  . Self administers medications as prescribed . Attends all scheduled provider appointments . Calls pharmacy for medication refills . Performs ADL's independently . Performs IADL's independently . Calls provider office for new concerns or questions  Initial goal documentation     .  COMPLETED: I need to manage my chronic constipation (pt-stated)        Current Barriers:  . Financial . Self-manages medications.  Uses pill bottles from pharmacy . Chronic Constipation o Patient has tried and failed miralax, colace and stimulant laxatives o He has been dealing with constipation for years and it has vastly affected his quality of life o He states the only medication that works is Linzess 182mg (costly). He cannot afford this medication out of pocket.  He has been obtaining samples from PCP  Pharmacist Clinical Goal(s):  .Marland KitchenOver the next 90 days, patient will work with PharmD and provider towards optimized medication management Goal Met  . Over the next 30 days, patient will work with PharmD and PCP to obtain patient assistance for Linzess Goal Met   Interventions: . Comprehensive medication review performed; medication list updated in electronic medical record . Counseled patient on diet & lifestyle modifications for patients with constipation o Exercised as able o Increase water intake o Continue to take medications as prescribed o Prepared [employer] silver scripts tier exception for Linzess to see if we can reduce copay ($155/month) o PCP to sign if she agrees with plan.  Patient was denied for patient assistance due to income. - 08/02/19 [facility]  Will follow up in 30 days. - [place] copay $128--pt unable to afford.   - 08/20/2019-All methods to retrieve lower copays have been denied--patient will have to pay out of pocket and continue to get samples.  .Marland KitchenCARE PLAN ENTRY . (see  longtitudinal plan of care for additional care plan information) . Current Barriers:  .Marland KitchenKnowledge Deficits related to disease process and Self Health management of Chronic Constipation  . FFilm/video editor  . Chronic Disease Management support and education needs related to Chronic Constipation, HTN, Prediabetes, Mixed Hyperlipidemia  . Nurse Case Manager Clinical Goal(s):  .Marland KitchenOver the next 90 days, patient will work with the CCM team and PCP to address needs related to disease education and support to help improve patient's Self Health management of chronic constipation  CCM RN CM Interventions:  11/01/19 call completed with patient . Evaluation of current treatment plan related to Chronic Constipation and patient's adherence to plan as established by provider. . Provided education to patient re: disease process related Chronic Constipation, Self Health management and pharmacological recommendations . Reviewed medications with patient and discussed patient has used Linzess for treatment of Chronic Constipation with good effectiveness, he continues to have financial hardship paying this medication and rely's on PCP for samples; Determined patient has worked with the embedded Pharm D to obtain financial assistance with the cost of this drug; Per review of the Pharm D's update, there is no financial assistance available to assist with the cost of this drug, Kevin Weiss need to rely  on samples from the PCP or pay out of pocket for this medication . Discussed plans with patient for ongoing care management follow up and provided patient with direct contact information for care management team . Provided patient with printed educational materials related to Chronic Constipation   Patient Self Care Activities:  . Patient will take medications as prescribed . Patient will focus on improved adherence by applying for financial assistance to obtain medication  Please see past updates related to  this goal by clicking on the "Past Updates" button in the selected goal      .  I would like to control my blood pressure (pt-stated)        Current Barriers:  . Uncontrolled hypertension, complicated by HLD, CHF . Current antihypertensive regimen: lisinopril, HCTZ . Previous antihypertensives tried: amlodipine (d/c'd due to patient preference/liver) . Current home BP readings: 120s/70-80s . Most recent eGFR/CrCl 76 (Scr 1.22)  Pharmacist Clinical Goal(s):  Marland Kitchen Over the next 90 days, patient will work with the CCM team and PCP to optimize antihypertensive regimen Goal Met   (see longtitudinal plan of care for additional care plan information)  Current Barriers:  Marland Kitchen Knowledge Deficits related to disease process and Self Health management of HTN . Chronic Disease Management support and education needs related to HTN, Prediabetes, Mixed Hyperlipidemia  Nurse Case Manager Clinical Goal(s):  Marland Kitchen New 02/07/20 Over the next 90 days, patient will work with the CCM team and PCP to address needs related to disease education and support to improve Self Health management of HTN  CCM RN CM Interventions:  02/07/20 call completed with patient  . Evaluation of current treatment plan related to HTN and patient's adherence to plan as established by provider. . Provided education to patient re: disease process and Self Health management of HTN, including adherence to diet, exercise and medication management as prescribed; Determined patient is at goal BP and is able to Self monitor his BP at home; educated on target BP ,130/80  . Reviewed medications with patient and discussed patient is adhering to his prescribed medication regimen w/o noted SE  . Discussed plans with patient for ongoing care management follow up and provided patient with direct contact information for care management team . Advised patient, providing education and rationale, to monitor blood pressure daily and record, calling the CCM team  and or PCP for findings outside established parameters.  . Provided patient with printed educational materials related to What is High Blood Pressure?; BP Log; Why Should I Lower Sodium?; Elderly Nutrition 101  Patient Self Care Activities:  . Patient will continue to check BP 3x weekly or if symptomatic, document, and provide at future appointments . Patient will focus on medication adherence  Please see past updates related to this goal by clicking on the "Past Updates" button in the selected goal        Plan:   Telephone follow up appointment with care management team member scheduled for: 04/03/20  Barb Merino, RN, BSN, CCM Care Management Coordinator [facility]  Direct Phone: [phone]

## 2020-02-12 ENCOUNTER — Telehealth: Payer: Self-pay | Admitting: Internal Medicine

## 2020-02-12 NOTE — Chronic Care Management (AMB) (Signed)
  Chronic Care Management   Note  02/12/2020 Name: Kevin Weiss MRN: [id number] DOB: 1941/08/11  Kevin Weiss is a 79 y.o. year old male who is a primary care patient of [place], Sandrea Matte and is actively engaged with the care management team. I reached out to [name] by phone today to assist with scheduling an initial visit with the Pharmacist.  Follow up plan: Unsuccessful telephone outreach attempt made. A HIPPA compliant phone message was left for the patient providing contact information and requesting a return call. The care management team will reach out to the patient again over the next 7 days. If patient returns call to provider office, please advise to call [place] at [phone].  [place], [place] [address] Direct Dial: [phone] [email or web address]: [email or web address]

## 2020-02-12 NOTE — Chronic Care Management (AMB) (Signed)
  Chronic Care Management   Note  02/12/2020 Name: BETH SPACKMAN MRN: [id number] DOB: May 30, 1941  JCION BUDDENHAGEN is a 79 y.o. year old male who is a primary care patient of [place], Sandrea Matte and is actively engaged with the care management team. I reached out to [name] by phone today to assist with scheduling an initial visit with the Pharmacist.  Follow up plan: Telephone appointment with care management team member scheduled for: 03/10/2020.  [place], [place] [address] Direct Dial: [phone] [email or web address] Website: [email or web address]

## 2020-03-10 ENCOUNTER — Telehealth: Payer: Medicare HMO

## 2020-03-13 ENCOUNTER — Encounter: Payer: Medicare HMO | Admitting: Internal Medicine

## 2020-03-13 ENCOUNTER — Ambulatory Visit: Payer: Medicare HMO

## 2020-03-19 ENCOUNTER — Telehealth: Payer: Self-pay

## 2020-03-19 NOTE — Chronic Care Management (AMB) (Signed)
° ° °  Chronic Care Management Pharmacy Assistant   Name: Kevin Weiss  MRN: [id number] DOB: Oct 13, 1940  Reason for Encounter: Medication Review/ Initial Questions for CCM visit.  PCP : Kevin Mattocks, PA-C  Allergies:  No Known Allergies  Medications: Outpatient Encounter Medications as of 03/19/2020  Medication Sig Note   aspirin 81 MG tablet Take 81 mg by mouth daily.    Cholecalciferol (VITAMIN D-3) 1000 UNITS CAPS Take 1 capsule by mouth daily.    ferrous sulfate (CVS IRON) 325 (65 FE) MG tablet TAKE 1 TABLET (325 MG TOTAL) BY MOUTH 3 (THREE) TIMES DAILY WITH MEALS.    Flaxseed, Linseed, (FLAXSEED OIL) 1000 MG CAPS Take by mouth.    Garlic (GARLIQUE PO) Take by mouth.    hydrochlorothiazide (MICROZIDE) 12.5 MG capsule TAKE 1 CAPSULE BY MOUTH EVERY DAY    linaclotide (LINZESS) 145 MCG CAPS capsule Take 145 mcg by mouth daily before breakfast. 08/22/2019: samples   lisinopril (ZESTRIL) 10 MG tablet TAKE 1 TABLET BY MOUTH EVERY DAY    oxybutynin (DITROPAN XL) 15 MG 24 hr tablet Take 15 mg by mouth at bedtime.    No facility-administered encounter medications on file as of 03/19/2020.    Current Diagnosis: Patient Active Problem List   Diagnosis Date Noted   Prediabetes 02/02/2019   Primary osteoarthritis of left hip 04/11/2018   Preop cardiovascular exam 03/30/2018   Abnormal echocardiogram 03/30/2018   Obese abdomen 07/22/2016   Retrognathia 11/03/2015   Morbid obesity ([place]) 11/03/2015   Cardiac hypertrophy 11/03/2015   OSA on CPAP 11/03/2015   Degenerative joint disease (DJD) of hip 03/12/2014   OBESITY, NOS 10/13/2006   HYPERTENSION, BENIGN SYSTEMIC 10/13/2006   CHF, EJECTION FRACTION > OR = 50% 10/13/2006   CARDIOMEGALY 10/13/2006   HEMORRHOIDS, NOS 10/13/2006   GASTROESOPHAGEAL REFLUX, NO ESOPHAGITIS 10/13/2006   IMPOTENCE, 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   APNEA, SLEEP 10/13/2006     Follow-Up:  Pharmacist Review- Tried to call  patient on hm#, no ans, voicemail box full. Tried to call wife Kevin Weiss cell, number not in service. Kevin Weiss, Kevin Weiss Pharmacist Assistant [phone]

## 2020-03-20 ENCOUNTER — Other Ambulatory Visit: Payer: Self-pay

## 2020-03-20 ENCOUNTER — Ambulatory Visit (INDEPENDENT_AMBULATORY_CARE_PROVIDER_SITE_OTHER): Payer: Medicare HMO

## 2020-03-20 ENCOUNTER — Encounter: Payer: Medicare HMO | Admitting: Internal Medicine

## 2020-03-20 VITALS — BP 122/82 | HR 76 | Temp 98.0°F | Ht 67.0 in | Wt 248.0 lb

## 2020-03-20 DIAGNOSIS — R7303 Prediabetes: Secondary | ICD-10-CM

## 2020-03-20 DIAGNOSIS — Z0000 Encounter for general adult medical examination without abnormal findings: Secondary | ICD-10-CM

## 2020-03-20 DIAGNOSIS — Z23 Encounter for immunization: Secondary | ICD-10-CM | POA: Diagnosis not present

## 2020-03-20 DIAGNOSIS — E782 Mixed hyperlipidemia: Secondary | ICD-10-CM

## 2020-03-20 DIAGNOSIS — I10 Essential (primary) hypertension: Secondary | ICD-10-CM

## 2020-03-20 MED ORDER — PREVNAR 13 IM SUSP: 0.5000 mL | INTRAMUSCULAR | 0 refills | Status: AC

## 2020-03-20 NOTE — Patient Instructions (Signed)
[name] , Thank you for taking time to come for your Medicare Wellness Visit. I appreciate your ongoing commitment to your health goals. Please review the following plan we discussed and let me know if I can assist you in the future.   Screening recommendations/referrals: Colonoscopy: not required Recommended yearly ophthalmology/optometry visit for glaucoma screening and checkup Recommended yearly dental visit for hygiene and checkup  Vaccinations: Influenza vaccine: due Pneumococcal vaccine: sent to pharmacy Tdap vaccine: declined Shingles vaccine: discussed   Covid-19: 09/17/2019, 08/28/2019  Advanced directives: Please bring a copy of your POA (Power of Attorney) and/or Living Will to your next appointment.   Conditions/risks identified: none  Next appointment: 04/08/2020 at 11:15 Follow up in one year for your annual wellness visit.   Preventive Care 77 Years and Older, Male Preventive care refers to lifestyle choices and visits with your health care provider that can promote health and wellness. What does preventive care include?  A yearly physical exam. This is also called an annual well check.  Dental exams once or twice a year.  Routine eye exams. Ask your health care provider how often you should have your eyes checked.  Personal lifestyle choices, including:  Daily care of your teeth and gums.  Regular physical activity.  Eating a healthy diet.  Avoiding tobacco and drug use.  Limiting alcohol use.  Practicing safe sex.  Taking low doses of aspirin every day.  Taking vitamin and mineral supplements as recommended by your health care provider. What happens during an annual well check? The services and screenings done by your health care provider during your annual well check will depend on your age, overall health, lifestyle risk factors, and family history of disease. Counseling  Your health care provider may ask you questions about your:  Alcohol  use.  Tobacco use.  Drug use.  Emotional well-being.  Home and relationship well-being.  Sexual activity.  Eating habits.  History of falls.  Memory and ability to understand (cognition).  Work and work Statistician. Screening  You may have the following tests or measurements:  Height, weight, and BMI.  Blood pressure.  Lipid and cholesterol levels. These may be checked every 5 years, or more frequently if you are over 77 years old.  Skin check.  Lung cancer screening. You may have this screening every year starting at age 55 if you have a 30-pack-year history of smoking and currently smoke or have quit within the past 15 years.  Fecal occult blood test (FOBT) of the stool. You may have this test every year starting at age 77.  Flexible sigmoidoscopy or colonoscopy. You may have a sigmoidoscopy every 5 years or a colonoscopy every 10 years starting at age 77.  Prostate cancer screening. Recommendations will vary depending on your family history and other risks.  Hepatitis C blood test.  Hepatitis B blood test.  Sexually transmitted disease (STD) testing.  Diabetes screening. This is done by checking your blood sugar (glucose) after you have not eaten for a while (fasting). You may have this done every 1-3 years.  Abdominal aortic aneurysm (AAA) screening. You may need this if you are a current or former smoker.  Osteoporosis. You may be screened starting at age 77 if you are at high risk. Talk with your health care provider about your test results, treatment options, and if necessary, the need for more tests. Vaccines  Your health care provider may recommend certain vaccines, such as:  Influenza vaccine. This is recommended every year.  Tetanus, diphtheria, and acellular pertussis (Tdap, Td) vaccine. You may need a Td booster every 10 years.  Zoster vaccine. You may need this after age 77.  Pneumococcal 13-valent conjugate (PCV13) vaccine. One dose is  recommended after age 15.  Pneumococcal polysaccharide (PPSV23) vaccine. One dose is recommended after age 77. Talk to your health care provider about which screenings and vaccines you need and how often you need them. This information is not intended to replace advice given to you by your health care provider. Make sure you discuss any questions you have with your health care provider. Document Released: 08/29/2015 Document Revised: 04/21/2016 Document Reviewed: 06/03/2015 Elsevier Interactive Patient Education  2017 [place] Prevention in the Home Falls can cause injuries. They can happen to people of all ages. There are many things you can do to make your home safe and to help prevent falls. What can I do on the outside of my home?  Regularly fix the edges of walkways and driveways and fix any cracks.  Remove anything that might make you trip as you walk through a door, such as a raised step or threshold.  Trim any bushes or trees on the path to your home.  Use bright outdoor lighting.  Clear any walking paths of anything that might make someone trip, such as rocks or tools.  Regularly check to see if handrails are loose or broken. Make sure that both sides of any steps have handrails.  Any raised decks and porches should have guardrails on the edges.  Have any leaves, snow, or ice cleared regularly.  Use sand or salt on walking paths during winter.  Clean up any spills in your garage right away. This includes oil or grease spills. What can I do in the bathroom?  Use night lights.  Install grab bars by the toilet and in the tub and shower. Do not use towel bars as grab bars.  Use non-skid mats or decals in the tub or shower.  If you need to sit down in the shower, use a plastic, non-slip stool.  Keep the floor dry. Clean up any water that spills on the floor as soon as it happens.  Remove soap buildup in the tub or shower regularly.  Attach bath mats  securely with double-sided non-slip rug tape.  Do not have throw rugs and other things on the floor that can make you trip. What can I do in the bedroom?  Use night lights.  Make sure that you have a light by your bed that is easy to reach.  Do not use any sheets or blankets that are too big for your bed. They should not hang down onto the floor.  Have a firm chair that has side arms. You can use this for support while you get dressed.  Do not have throw rugs and other things on the floor that can make you trip. What can I do in the kitchen?  Clean up any spills right away.  Avoid walking on wet floors.  Keep items that you use a lot in easy-to-reach places.  If you need to reach something above you, use a strong step stool that has a grab bar.  Keep electrical cords out of the way.  Do not use floor polish or wax that makes floors slippery. If you must use wax, use non-skid floor wax.  Do not have throw rugs and other things on the floor that can make you trip. What can I do  with my stairs?  Do not leave any items on the stairs.  Make sure that there are handrails on both sides of the stairs and use them. Fix handrails that are broken or loose. Make sure that handrails are as long as the stairways.  Check any carpeting to make sure that it is firmly attached to the stairs. Fix any carpet that is loose or worn.  Avoid having throw rugs at the top or bottom of the stairs. If you do have throw rugs, attach them to the floor with carpet tape.  Make sure that you have a light switch at the top of the stairs and the bottom of the stairs. If you do not have them, ask someone to add them for you. What else can I do to help prevent falls?  Wear shoes that:  Do not have high heels.  Have rubber bottoms.  Are comfortable and fit you well.  Are closed at the toe. Do not wear sandals.  If you use a stepladder:  Make sure that it is fully opened. Do not climb a closed  stepladder.  Make sure that both sides of the stepladder are locked into place.  Ask someone to hold it for you, if possible.  Clearly mark and make sure that you can see:  Any grab bars or handrails.  First and last steps.  Where the edge of each step is.  Use tools that help you move around (mobility aids) if they are needed. These include:  Canes.  Walkers.  Scooters.  Crutches.  Turn on the lights when you go into a dark area. Replace any light bulbs as soon as they burn out.  Set up your furniture so you have a clear path. Avoid moving your furniture around.  If any of your floors are uneven, fix them.  If there are any pets around you, be aware of where they are.  Review your medicines with your doctor. Some medicines can make you feel dizzy. This can increase your chance of falling. Ask your doctor what other things that you can do to help prevent falls. This information is not intended to replace advice given to you by your health care provider. Make sure you discuss any questions you have with your health care provider. Document Released: 05/29/2009 Document Revised: 01/08/2016 Document Reviewed: 09/06/2014 Elsevier Interactive Patient Education  2017 [employer].

## 2020-03-20 NOTE — Progress Notes (Signed)
This visit occurred during the SARS-CoV-2 public health emergency.  Safety protocols were in place, including screening questions prior to the visit, additional usage of staff PPE, and extensive cleaning of exam room while observing appropriate contact time as indicated for disinfecting solutions.  Subjective:   Kevin Weiss is a 79 y.o. male who presents for Medicare Annual/Subsequent preventive examination.  Review of Systems     Cardiac Risk Factors include: advanced age (>67mn, >>63women);hypertension;male gender;obesity (BMI >30kg/m2);sedentary lifestyle     Objective:    Today's Vitals   03/20/20 1005  BP: 122/82  Pulse: 76  Temp: 98 F (36.7 C)  TempSrc: Oral  SpO2: 98%  Weight: 248 lb (112.5 kg)  Height: 5' 7"  (1.702 m)   Body mass index is 38.84 kg/m.  Advanced Directives 03/20/2020 03/08/2019 05/05/2018 04/11/2018 03/31/2018 03/13/2014 03/01/2014  Does Patient Have a Medical Advance Directive? Yes Yes Yes Yes Yes Patient does not have advance directive Patient does not have advance directive  Type of Advance Directive [place] will [place] will Living will [place]one JackLiving will HSummit ViewLiving will - -  Does patient want to make changes to medical advance directive? - No - Patient declined - No - Patient declined - - -  Copy of H[place]in Chart? No - copy requested No - copy requested - - No - copy requested - -    Current Medications (verified) Outpatient Encounter Medications as of 03/20/2020  Medication Sig  . aspirin 81 MG tablet Take 81 mg by mouth daily.  . Cholecalciferol (VITAMIN D-3) 1000 UNITS CAPS Take 1 capsule by mouth daily.  . ferrous sulfate ([employer] IRON) 325 (65 FE) MG tablet TAKE 1 TABLET (325 MG TOTAL) BY MOUTH 3 (THREE) TIMES DAILY WITH MEALS.  .Marland KitchenFlaxseed, Linseed, (FLAXSEED OIL) 1000 MG CAPS Take by mouth.  . Garlic (GARLIQUE PO) Take by  mouth.  . hydrochlorothiazide (MICROZIDE) 12.5 MG capsule TAKE 1 CAPSULE BY MOUTH EVERY DAY  . linaclotide (LINZESS) 145 MCG CAPS capsule Take 145 mcg by mouth daily before breakfast.  . lisinopril (ZESTRIL) 10 MG tablet TAKE 1 TABLET BY MOUTH EVERY DAY  . oxybutynin (DITROPAN XL) 15 MG 24 hr tablet Take 15 mg by mouth at bedtime.  . pneumococcal 13-valent conjugate vaccine (PREVNAR 13) SUSP injection Inject 0.5 mLs into the muscle tomorrow at 10 am for 1 dose.   No facility-administered encounter medications on file as of 03/20/2020.    Allergies (verified) Patient has no known allergies.   History: Past Medical History:  Diagnosis Date  . Arthritis   . Cardiomegaly 10/13/2006   Qualifier: Diagnosis of  By: MEusebio Friendly   . CHF, EJECTION FRACTION > OR = 50% 10/13/2006   Qualifier: Diagnosis of  By: MEusebio Friendly   . Degenerative joint disease (DJD) of hip 03/12/2014  . GASTROESOPHAGEAL REFLUX, NO ESOPHAGITIS 10/13/2006   patient denies  . HEMORRHOIDS, NOS 10/13/2006   Qualifier: Diagnosis of  By: MEusebio Friendly   . History of colon polyps   . Hypertension    takes Amlodipine and Prinizide daily  . IMPOTENCE, 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   Qualifier: Diagnosis of  By: MEusebio Friendly   . Morbid obesity ([place] 11/03/2015  . OSA on CPAP 11/03/2015  . Retrognathia 11/03/2015   Past Surgical History:  Procedure Laterality Date  . COLONOSCOPY    . KNEE ARTHROSCOPY Left 15+yrs ago  . TOTAL HIP  ARTHROPLASTY Right 03/12/2014   Procedure: TOTAL HIP ARTHROPLASTY ANTERIOR APPROACH;  Surgeon: Hessie Dibble, MD;  Location: [facility];  Service: Orthopedics;  Laterality: Right;  . TOTAL HIP ARTHROPLASTY Left 04/11/2018   Procedure: TOTAL HIP ARTHROPLASTY ANTERIOR APPROACH;  Surgeon: Melrose Nakayama, MD;  Location: [facility];  Service: Orthopedics;  Laterality: Left;   Family History  Problem Relation Age of Onset  . Hypertension Mother   . Hyperlipidemia Mother   . Alzheimer's disease Mother     . Heart disease Father   . Hyperlipidemia Father   . Hypertension Father   . Hypertension Sister   . Hypertension Brother   . Heart attack Maternal Grandmother    Social History   Socioeconomic History  . Marital status: Married    Spouse name: Not on file  . Number of children: Not on file  . Years of education: Not on file  . Highest education level: Not on file  Occupational History  . Occupation: retired  Tobacco Use  . Smoking status: Former Smoker    Packs/day: 0.25    Years: 10.00    Pack years: 2.50  . Smokeless tobacco: Never Used  . Tobacco comment: 40 + years  Vaping Use  . Vaping Use: Never used  Substance and Sexual Activity  . Alcohol use: Not Currently    Comment: rarely  . Drug use: Not Currently    Types: Marijuana    Comment: 03/30/18  . Sexual activity: Not Currently  Other Topics Concern  . Not on file  Social History Narrative   Live at home with wife. Retired from [employer] as a Librarian, academic.    Social Determinants of Health   Financial Resource Strain: Low Risk   . Difficulty of Paying Living Expenses: Not hard at all  Food Insecurity: No Food Insecurity  . Worried About Charity fundraiser in the Last Year: Never true  . Ran Out of Food in the Last Year: Never true  Transportation Needs: No Transportation Needs  . Lack of Transportation (Medical): No  . Lack of Transportation (Non-Medical): No  Physical Activity: Inactive  . Days of Exercise per Week: 0 days  . Minutes of Exercise per Session: 0 min  Stress: No Stress Concern Present  . Feeling of Stress : Not at all  Social Connections:   . Frequency of Communication with Friends and Family:   . Frequency of Social Gatherings with Friends and Family:   . Attends Religious Services:   . Active Member of Clubs or Organizations:   . Attends Archivist Meetings:   Marland Kitchen Marital Status:     Tobacco Counseling Counseling given: Not Answered Comment: 40 +  years   Clinical Intake:  Pre-visit preparation completed: Yes  Pain : No/denies pain     Nutritional Status: BMI > 30  Obese Nutritional Risks: None Diabetes: No  How often do you need to have someone help you when you read instructions, pamphlets, or other written materials from your doctor or pharmacy?: 1 - Never What is the last grade level you completed in school?: 12th grade  Diabetic? no  Interpreter Needed?: No  Information entered by :: NAllen LPN   Activities of Daily Living In your present state of health, do you have any difficulty performing the following activities: 03/20/2020  Hearing? Y  Comment some hearing loss in right ear  Vision? N  Difficulty concentrating or making decisions? N  Walking or climbing stairs? N  Dressing or bathing? N  Doing errands, shopping? N  Preparing Food and eating ? N  Using the Toilet? N  In the past six months, have you accidently leaked urine? N  Do you have problems with loss of bowel control? N  Managing your Medications? N  Managing your Finances? N  Housekeeping or managing your Housekeeping? N  Some recent data might be hidden    Patient Care Team: Rodriguez-[place], Sandrea Matte as PCP - General (Internal Medicine) Daneen Schick as Social Worker [place], Claudette Stapler, RN as Case Manager  Indicate any recent Medical Services you may have received from other than [facility] providers in the past year (date may be approximate).     Assessment:   This is a routine wellness examination for [place].  Hearing/Vision screen  Hearing Screening   125Hz  250Hz  500Hz  1000Hz  2000Hz  3000Hz  4000Hz  6000Hz  8000Hz   Right ear:           Left ear:           Vision Screening Comments: No regular eye exams, [facility]  Dietary issues and exercise activities discussed: Current Exercise Habits: The patient does not participate in regular exercise at present  Goals    .  "to improve my chronic constipation" (pt-stated)      CARE  PLAN ENTRY (see longitudinal plan of care for additional care plan information)  Current Barriers:  Marland Kitchen Knowledge Deficits related to disease process and Self Health management of Chronic Constipation  . Chronic Disease Management support and education needs related to Prediabetes, HTN, Mixed Hyperlipidemia, Constipation, unspecified  Nurse Case Manager Clinical Goal(s):  Marland Kitchen Over the next 90 days, patient will work with the CCM team and PCP to address needs related to disease education and support to improve Self Health management of Chronic Constipation   CCM RN CM Interventions:  02/07/20 call completed with patient . Inter-disciplinary care team collaboration (see longitudinal plan of care) . Evaluation of current treatment plan related to Chronic Constipation and patient's adherence to plan as established by provider. . Provided education to patient re: disease process related to Chronic Constipation and ways to improve Self management this condition  . Reviewed medications with patient and discussed patient is getting good effectiveness from [facility], he continues to have financial hardship paying for this medication, he rely's on getting samples from the PCP office; discussed patient is having BM every few days; patient would like to work with embedded Pharm D for support of medication management in support of his chronic conditions  . Discussed plans with patient for ongoing care management follow up and provided patient with direct contact information for care management team . Provided patient with printed educational materials related to Elderly Nutrition 101 . Pharmacy referral for embedded Pharm D   Patient Self Care Activities:  . Self administers medications as prescribed . Attends all scheduled provider appointments . Calls pharmacy for medication refills . Performs ADL's independently . Performs IADL's independently . Calls provider office for new concerns or  questions  Initial goal documentation     .  Collaborate with RN Case Manager to perform appropriate assessments to determine care management and care coordination needs      Current Barriers:  . Care coordination needs related to independent self-health management of chronic conditions including HTN and Pre Diabetes  Clinical Social Work Clinical Goal(s):  Marland Kitchen Over the next 30 days the patient will work with RN Case Manager to establish an individualized plan of care related to the  management of patients identified chronic conditions . Over the next 30 days the patient will work with embedded PharmD to address patients identified concerns surrounding cost on Linzess  Interventions: . Patient interviewed and appropriate assessments performed . Assessed understanding of current chronic medical conditions. The patient identifies as having high blood pressure  . Determined the patient feels his HTN is well controlled as evidenced by stable readings during physician appointments . Advised the patient to expect a call in the coming month from RN Case Manager whom will assist with disease management . Assessed for patient ability to afford prescribed medications. The patients identifies difficulty affording Linzess . Advised the patient to expect a call from embedded PharmD for med assistance . Collaboration with CM team regarding patient enrollment and identified case management needs  Patient Self Care Activities:  . Self administers medications as prescribed . Attends all scheduled provider appointments . Performs ADL's independently . Calls provider office for new concerns or questions  Initial goal documentation     .  I would like to control my blood pressure (pt-stated)      Current Barriers:  . Uncontrolled hypertension, complicated by HLD, CHF . Current antihypertensive regimen: lisinopril, HCTZ . Previous antihypertensives tried: amlodipine (d/c'd due to patient  preference/liver) . Current home BP readings: 120s/70-80s . Most recent eGFR/CrCl 76 (Scr 1.22)  Pharmacist Clinical Goal(s):  Marland Kitchen Over the next 90 days, patient will work with the CCM team and PCP to optimize antihypertensive regimen Goal Met   (see longtitudinal plan of care for additional care plan information)  Current Barriers:  Marland Kitchen Knowledge Deficits related to disease process and Self Health management of HTN . Chronic Disease Management support and education needs related to HTN, Prediabetes, Mixed Hyperlipidemia  Nurse Case Manager Clinical Goal(s):  Marland Kitchen New 02/07/20 Over the next 90 days, patient will work with the CCM team and PCP to address needs related to disease education and support to improve Self Health management of HTN  CCM RN CM Interventions:  02/07/20 call completed with patient  . Evaluation of current treatment plan related to HTN and patient's adherence to plan as established by provider. . Provided education to patient re: disease process and Self Health management of HTN, including adherence to diet, exercise and medication management as prescribed; Determined patient is at goal BP and is able to Self monitor his BP at home; educated on target BP ,130/80  . Reviewed medications with patient and discussed patient is adhering to his prescribed medication regimen w/o noted SE  . Discussed plans with patient for ongoing care management follow up and provided patient with direct contact information for care management team . Advised patient, providing education and rationale, to monitor blood pressure daily and record, calling the CCM team and or PCP for findings outside established parameters.  . Provided patient with printed educational materials related to What is High Blood Pressure?; BP Log; Why Should I Lower Sodium?; Elderly Nutrition 101  Patient Self Care Activities:  . Patient will continue to check BP 3x weekly or if symptomatic, document, and provide at future  appointments . Patient will focus on medication adherence  Please see past updates related to this goal by clicking on the "Past Updates" button in the selected goal      .  Patient Stated    .  Patient Stated      03/20/2020, wants to lose 20 pounds    .  Weight (lb) < 200 lb (90.7 kg)  03/08/2019 wants to get down to 220 pounds      Depression Screen PHQ 2/9 Scores 03/20/2020 03/08/2019 01/23/2019 08/15/2018  PHQ - 2 Score 0 0 0 0  PHQ- 9 Score 0 0 - -    Fall Risk Fall Risk  03/20/2020 06/21/2019 03/08/2019 01/23/2019 08/15/2018  Falls in the past year? 0 0 0 0 0  Number falls in past yr: - - 0 - -  Risk for fall due to : Medication side effect - Medication side effect - -  Follow up Falls evaluation completed;Education provided;Falls prevention discussed - Falls evaluation completed;Education provided;Falls prevention discussed - -    Any stairs in or around the home? No  If so, are there any without handrails? n/a Home free of loose throw rugs in walkways, pet beds, electrical cords, etc? Yes  Adequate lighting in your home to reduce risk of falls? Yes   ASSISTIVE DEVICES UTILIZED TO PREVENT FALLS:  Life alert? No  Use of a cane, walker or w/c? No  Grab bars in the bathroom? No  Shower chair or bench in shower? No  Elevated toilet seat or a handicapped toilet? Yes   TIMED UP AND GO:  Was the test performed? No .    Gait steady and fast without use of assistive device  Cognitive Function:     6CIT Screen 03/20/2020 03/08/2019  What Year? 4 points 0 points  What month? 0 points 0 points  What time? 0 points 0 points  Count back from 20 0 points 0 points  Months in reverse 0 points 0 points  Repeat phrase 2 points 0 points  Total Score 6 0    Immunizations Immunization History  Administered Date(s) Administered  . Influenza, High Dose Seasonal PF 06/14/2019  . Influenza-Unspecified 03/16/2014  . PFIZER SARS-COV-2 Vaccination 08/28/2019, 09/17/2019  .  Pneumococcal Polysaccharide-23 03/14/2014, 04/13/2018  . Td 08/16/1996    TDAP status: Due, Education has been provided regarding the importance of this vaccine. Advised may receive this vaccine at local pharmacy or Health Dept. Aware to provide a copy of the vaccination record if obtained from local pharmacy or Health Dept. Verbalized acceptance and understanding. Flu Vaccine status: Up to date Pneumococcal vaccine status: sent to pharmacy Covid-19 vaccine status: Completed vaccines  Qualifies for Shingles Vaccine? Yes   Zostavax completed No   Shingrix Completed?: No.    Education has been provided regarding the importance of this vaccine. Patient has been advised to call insurance company to determine out of pocket expense if they have not yet received this vaccine. Advised may also receive vaccine at local pharmacy or Health Dept. Verbalized acceptance and understanding.  Screening Tests Health Maintenance  Topic Date Due  . PNA vac Low Risk Adult (2 of 2 - PCV13) 04/14/2019  . INFLUENZA VACCINE  03/16/2020  . TETANUS/TDAP  03/20/2021 (Originally 08/16/2006)  . Hepatitis C Screening  03/20/2021 (Originally 1941-07-14)  . COVID-19 Vaccine  Completed    Health Maintenance  Health Maintenance Due  Topic Date Due  . PNA vac Low Risk Adult (2 of 2 - PCV13) 04/14/2019  . INFLUENZA VACCINE  03/16/2020    Colorectal cancer screening: No longer required.   Lung Cancer Screening: (Low Dose CT Chest recommended if Age 14-80 years, 30 pack-year currently smoking OR have quit w/in 15years.) does not qualify.   Lung Cancer Screening Referral: no  Additional Screening:  Hepatitis C Screening: does qualify; Declined  Vision Screening: Recommended annual ophthalmology exams for early detection  of glaucoma and other disorders of the eye. Is the patient up to date with their annual eye exam?  No  Who is the provider or what is the name of the office in which the patient attends annual eye  exams? [facility] If pt is not established with a provider, would they like to be referred to a provider to establish care? No .   Dental Screening: Recommended annual dental exams for proper oral hygiene  Community Resource Referral / Chronic Care Management: CRR required this visit?  No   CCM required this visit?  No      Plan:     I have personally reviewed and noted the following in the patient's chart:   . Medical and social history . Use of alcohol, tobacco or illicit drugs  . Current medications and supplements . Functional ability and status . Nutritional status . Physical activity . Advanced directives . List of other physicians . Hospitalizations, surgeries, and ER visits in previous 12 months . Vitals . Screenings to include cognitive, depression, and falls . Referrals and appointments  In addition, I have reviewed and discussed with patient certain preventive protocols, quality metrics, and best practice recommendations. A written personalized care plan for preventive services as well as general preventive health recommendations were provided to patient.     Kellie Simmering, LPN   12/21/3092   Nurse Notes:

## 2020-03-20 NOTE — Patient Instructions (Addendum)
Visit Information  Goals Addressed            This Visit's Progress   . Pharmacy Care Plan       CARE PLAN ENTRY (see longitudinal plan of care for additional care plan information)  Current Barriers:  . Chronic Disease Management support, education, and care coordination needs related to Hypertension, Hyperlipidemia, and Prediabetes   Hypertension BP Readings from Last 3 Encounters:  03/20/20 122/82  10/10/19 122/76  08/06/19 131/73   . Pharmacist Clinical Goal(s): o Over the next 180 days, patient will work with PharmD and providers to maintain BP goal <130/80 . Current regimen:  . Lisinopril 10mg  daily . Hydrochlorothiazide 12.5mg  daily . Interventions: o Provided dietary and exercise recommendations o Reviewed home blood pressure monitoring technique with patient . Patient self care activities - Over the next 180 days, patient will: o Check BP daily, document, and provide at future appointments o Ensure daily salt intake < 2300 mg/day o Try to exercise for 30 minutes daily 5 times per week (150 minutes total per week) o Work up to exercise goal, start with 1-2 days per week  Hyperlipidemia Lab Results  Component Value Date/Time   LDLCALC 115 (H) 06/21/2019 02:21 PM   . Pharmacist Clinical Goal(s): o Over the next 180 days, patient will work with PharmD and providers to achieve LDL goal < 100 . Current regimen:  o N/A . Interventions: o Provided dietary and exercise recommendations . Patient self care activities - Over the next 180 days, patient will: o Try to exercise for 30 minutes daily 5 times per week (150 minutes total per week) o Focus on a health, well-balanced diet using the PLATE method o Limit fried and fatty foods  Prediabetes Lab Results  Component Value Date/Time   HGBA1C 5.7 (H) 10/10/2019 12:01 PM   . Pharmacist Clinical Goal(s): o Over the next 180 days, patient will work with PharmD and providers to achieve A1c goal  5.6% . Current  regimen:  o N/A . Interventions: o Provided dietary and exercise recommendations o Provided patient education about the importance of diet and exercise to prevent progression of prediabetes . Patient self care activities - Over the next 180 days, patient will: o Check blood sugar weekly, document, and provide at future appointments o Contact provider with any episodes of hypoglycemia  Medication management . Pharmacist Clinical Goal(s): o Over the next 180 days, patient will work with PharmD and providers to maintain optimal medication adherence . [facility] . Interventions o Comprehensive medication review performed. o Continue current medication management strategy o Provided samples of Linzess during office visit . Patient self care activities - Over the next 180 days, patient will: o Focus on medication adherence by using pill box to organize medications o Take medications as prescribed o Report any questions or concerns to PharmD and/or provider(s)  Initial goal documentation        Mr. Soto was given information about Chronic Care Management services today including:  1. CCM service includes personalized support from designated clinical staff supervised by his physician, including individualized plan of care and coordination with other care providers 2. 24/7 contact phone numbers for assistance for urgent and routine care needs. 3. Standard insurance, coinsurance, copays and deductibles apply for chronic care management only during months in which we provide at least 20 minutes of these services. Most insurances cover these services at 100%, however patients may be responsible for any copay, coinsurance and/or deductible if applicable. This  service may help you avoid the need for more expensive face-to-face services. 4. Only one practitioner may furnish and bill the service in a calendar month. 5. The patient may stop CCM services at any time (effective at the end  of the month) by phone call to the office staff.  Patient agreed to services and verbal consent obtained.   The patient verbalized understanding of instructions provided today and agreed to receive a mailed copy of patient instruction and/or educational materials. Telephone follow up appointment with pharmacy team member scheduled for: 08/12/20 @ 3:30 PM  Jannette Fogo, PharmD Clinical Pharmacist [facility] [phone]   Prediabetes Eating Plan Prediabetes is a condition that causes blood sugar (glucose) levels to be higher than normal. This increases the risk for developing diabetes. In order to prevent diabetes from developing, your health care provider may recommend a diet and other lifestyle changes to help you:  Control your blood glucose levels.  Improve your cholesterol levels.  Manage your blood pressure. Your health care provider may recommend working with a diet and nutrition specialist (dietitian) to make a meal plan that is best for you. What are tips for following this plan? Lifestyle  Set weight loss goals with the help of your health care team. It is recommended that most people with prediabetes lose 7% of their current body weight.  Exercise for at least 30 minutes at least 5 days a week.  Attend a support group or seek ongoing support from a mental health counselor.  Take over-the-counter and prescription medicines only as told by your health care provider. Reading food labels  Read food labels to check the amount of fat, salt (sodium), and sugar in prepackaged foods. Avoid foods that have: ? Saturated fats. ? Trans fats. ? Added sugars.  Avoid foods that have more than 300 milligrams (mg) of sodium per serving. Limit your daily sodium intake to less than 2,300 mg each day. Shopping  Avoid buying pre-made and processed foods. Cooking  Cook with olive oil. Do not use butter, lard, or ghee.  Bake, broil, grill, or boil foods.  Avoid frying. Meal planning   Work with your dietitian to develop an eating plan that is right for you. This may include: ? Tracking how many calories you take in. Use a food diary, notebook, or mobile application to track what you eat at each meal. ? Using the glycemic index (GI) to plan your meals. The index tells you how quickly a food will raise your blood glucose. Choose low-GI foods. These foods take a longer time to raise blood glucose.  Consider following a Mediterranean diet. This diet includes: ? Several servings each day of fresh fruits and vegetables. ? Eating fish at least twice a week. ? Several servings each day of whole grains, beans, nuts, and seeds. ? Using olive oil instead of other fats. ? Moderate alcohol consumption. ? Eating small amounts of red meat and whole-fat dairy.  If you have high blood pressure, you may need to limit your sodium intake or follow a diet such as the DASH eating plan. DASH is an eating plan that aims to lower high blood pressure. What foods are recommended? The items listed below may not be a complete list. Talk with your dietitian about what dietary choices are best for you. Grains Whole grains, such as whole-wheat or whole-grain breads, crackers, cereals, and pasta. Unsweetened oatmeal. Bulgur. Barley. Quinoa. Brown rice. Corn or whole-wheat flour tortillas or taco shells. Vegetables Lettuce. Spinach.  Peas. Beets. Cauliflower. Cabbage. Broccoli. Carrots. Tomatoes. Squash. Eggplant. Herbs. Peppers. Onions. Cucumbers. Brussels sprouts. Fruits Berries. Bananas. Apples. Oranges. Grapes. Papaya. Mango. Pomegranate. Kiwi. Grapefruit. Cherries. Meats and other protein foods Seafood. Poultry without skin. Lean cuts of pork and beef. Tofu. Eggs. Nuts. Beans. Dairy Low-fat or fat-free dairy products, such as yogurt, cottage cheese, and cheese. Beverages Water. Tea. Coffee. Sugar-free or diet soda. Seltzer water. Lowfat or no-fat milk. Milk  alternatives, such as soy or almond milk. Fats and oils Olive oil. Canola oil. Sunflower oil. Grapeseed oil. Avocado. Walnuts. Sweets and desserts Sugar-free or low-fat pudding. Sugar-free or low-fat ice cream and other frozen treats. Seasoning and other foods Herbs. Sodium-free spices. Mustard. Relish. Low-fat, low-sugar ketchup. Low-fat, low-sugar barbecue sauce. Low-fat or fat-free mayonnaise. What foods are not recommended? The items listed below may not be a complete list. Talk with your dietitian about what dietary choices are best for you. Grains Refined white flour and flour products, such as bread, pasta, snack foods, and cereals. Vegetables Canned vegetables. Frozen vegetables with butter or cream sauce. Fruits Fruits canned with syrup. Meats and other protein foods Fatty cuts of meat. Poultry with skin. Breaded or fried meat. Processed meats. Dairy Full-fat yogurt, cheese, or milk. Beverages Sweetened drinks, such as sweet iced tea and soda. Fats and oils Butter. Lard. Ghee. Sweets and desserts Baked goods, such as cake, cupcakes, pastries, cookies, and cheesecake. Seasoning and other foods Spice mixes with added salt. Ketchup. Barbecue sauce. Mayonnaise. Summary  To prevent diabetes from developing, you may need to make diet and other lifestyle changes to help control blood sugar, improve cholesterol levels, and manage your blood pressure.  Set weight loss goals with the help of your health care team. It is recommended that most people with prediabetes lose 7 percent of their current body weight.  Consider following a Mediterranean diet that includes plenty of fresh fruits and vegetables, whole grains, beans, nuts, seeds, fish, lean meat, low-fat dairy, and healthy oils. This information is not intended to replace advice given to you by your health care provider. Make sure you discuss any questions you have with your health care provider. Document Revised: 11/24/2018  Document Reviewed: 10/06/2016 Elsevier Patient Education  2020 [employer].

## 2020-03-20 NOTE — Chronic Care Management (AMB) (Signed)
Chronic Care Management Pharmacy  Name: Kevin Weiss  MRN: [id number] DOB: September 02, 1940  Chief Complaint/ HPI  Kevin Weiss,  79 y.o. , male presents for their Initial CCM visit with the clinical pharmacist In office.  PCP : Shelby Mattocks, PA-C  Their chronic conditions include: Hypertension, Hyperlipidemia, Overactive Bladder and Prediabetes  Office Visits: 12/17/19 Telephone call: Pt called to confirm dosage of ferrous sulfate 324m. States that he discussed taking medication once daily with PCP and would feel more comfortable taking once daily.   10/10/19 OV: HgbA1c in prediabetic range (5.7%). Advised pt to continue low carobohydrate and no sugar diet. Pt does not check BP at home. Linzess has helped with constipation, but pt did not qualify for PAP. Continue exercising. HTN stable. Continue current medications.   Consult Visits:  CCM Encounters: 02/06/20 RN: Discussed HTN and constipation treatments plans.   11/01/19 RN: Established care plan. Evaluated current treatment plans. Advised pt to monitor BP at home.   Medications: Outpatient Encounter Medications as of 03/20/2020  Medication Sig Note  . aspirin 81 MG tablet Take 81 mg by mouth daily.   . Cholecalciferol (VITAMIN D-3) 1000 UNITS CAPS Take 1 capsule by mouth daily.   . ferrous sulfate ([facility] IRON) 325 (65 FE) MG tablet TAKE 1 TABLET (325 MG TOTAL) BY MOUTH 3 (THREE) TIMES DAILY WITH MEALS.   .Marland KitchenFlaxseed, Linseed, (FLAXSEED OIL) 1000 MG CAPS Take by mouth.   . Garlic (GARLIQUE PO) Take by mouth.   . hydrochlorothiazide (MICROZIDE) 12.5 MG capsule TAKE 1 CAPSULE BY MOUTH EVERY DAY   . linaclotide (LINZESS) 145 MCG CAPS capsule Take 145 mcg by mouth daily before breakfast. 08/22/2019: samples  . lisinopril (ZESTRIL) 10 MG tablet TAKE 1 TABLET BY MOUTH EVERY DAY   . oxybutynin (DITROPAN XL) 15 MG 24 hr tablet Take 15 mg by mouth at bedtime.   . pneumococcal 13-valent conjugate vaccine (PREVNAR 13) SUSP  injection Inject 0.5 mLs into the muscle tomorrow at 10 am for 1 dose.    No facility-administered encounter medications on file as of 03/20/2020.    Current Diagnosis/Assessment:  SDOH Interventions     Most Recent Value  SDOH Interventions  Financial Strain Interventions Other (Comment)  [Provided patient with samples of Linzess. Will reassess qualification for LWaldopatient assistance in 2022]      Goals Addressed            This Visit's Progress   . Pharmacy Care Plan       CARE PLAN ENTRY (see longitudinal plan of care for additional care plan information)  Current Barriers:  . Chronic Disease Management support, education, and care coordination needs related to Hypertension, Hyperlipidemia, and Prediabetes   Hypertension BP Readings from Last 3 Encounters:  03/20/20 122/82  10/10/19 122/76  08/06/19 131/73   . Pharmacist Clinical Goal(s): o Over the next 180 days, patient will work with PharmD and providers to maintain BP goal <130/80 . Current regimen:  . Lisinopril 127mdaily . Hydrochlorothiazide 12.2m70maily . Interventions: o Provided dietary and exercise recommendations o Reviewed home blood pressure monitoring technique with patient . Patient self care activities - Over the next 180 days, patient will: o Check BP daily, document, and provide at future appointments o Ensure daily salt intake < 2300 mg/day o Try to exercise for 30 minutes daily 5 times per week (150 minutes total per week) o Work up to exercise goal, start with 1-2 days per week  Hyperlipidemia Lab Results  Component Value Date/Time   LDLCALC 115 (H) 06/21/2019 02:21 PM   . Pharmacist Clinical Goal(s): o Over the next 180 days, patient will work with PharmD and providers to achieve LDL goal < 100 . Current regimen:  o N/A . Interventions: o Provided dietary and exercise recommendations . Patient self care activities - Over the next 180 days, patient will: o Try to exercise for 30  minutes daily 5 times per week (150 minutes total per week) o Focus on a health, well-balanced diet using the PLATE method o Limit fried and fatty foods  Prediabetes Lab Results  Component Value Date/Time   HGBA1C 5.7 (H) 10/10/2019 12:01 PM   . Pharmacist Clinical Goal(s): o Over the next 180 days, patient will work with PharmD and providers to achieve A1c goal  5.6% . Current regimen:  o N/A . Interventions: o Provided dietary and exercise recommendations o Provided patient education about the importance of diet and exercise to prevent progression of prediabetes . Patient self care activities - Over the next 180 days, patient will: o Check blood sugar weekly, document, and provide at future appointments o Contact provider with any episodes of hypoglycemia  Medication management . Pharmacist Clinical Goal(s): o Over the next 180 days, patient will work with PharmD and providers to maintain optimal medication adherence . [facility] . Interventions o Comprehensive medication review performed. o Continue current medication management strategy o Provided samples of Linzess during office visit . Patient self care activities - Over the next 180 days, patient will: o Focus on medication adherence by using pill box to organize medications o Take medications as prescribed o Report any questions or concerns to PharmD and/or provider(s)  Initial goal documentation        Prediabetes   A1c goal <5.7%  Recent Relevant Labs: Lab Results  Component Value Date/Time   HGBA1C 5.7 (H) 10/10/2019 12:01 PM   Checking BG: Weekly  Recent FBG Readings: Recent pre-meal BG readings:  Recent 2hr PP BG readings:   Recent HS BG readings:   Patient has failed these meds in past: N/A Patient is currently controlled on the following medications: . N/A  We discussed:  Diet extensively Pt states that he tries to get in his vegetables (but he does not love vegetables) He tries  to limit how much processed meats he eats He endorses eating a lot of seafood, sometimes fried but not always Pt has had to eat more soft foods lately due to several abscesses in mouth leading to teeth removal and still waiting on implants He has switched from eating sausage to [place] sausage Pt states that sweets are his weakness Advised pt to try and limit portion sizes of sweets and how often he eats them Exercise extensively Pt does yard work and plays golf, but not much exercise aside from that Recommend pt get 30 minutes of moderate intensity exercise daily 5 times a week (150 minutes total per week) Advised pt to start slow with 10-15 minutes a couple days a week and work up to 150 minutes weekly Discussed the HgbA1c lowering benefits of exercise (up to 0.7% reduction) . Diet and exercise can be an effective way for pt to manage prediabetes  . If not controlled by diet and exercise, condition could progress to diabetes and require medication  Plan Continue control with diet and exercise   Hyperlipidemia   LDL goal < 100  Lipid Panel     Component Value Date/Time   CHOL 169  06/21/2019 1421   TRIG 70 06/21/2019 1421   HDL 41 06/21/2019 1421   LDLCALC 115 (H) 06/21/2019 1421    Hepatic Function Latest Ref Rng & Units 10/10/2019 05/15/2019  Total Protein 6.0 - 8.5 g/dL 7.3 7.2  Albumin 3.7 - 4.7 g/dL 4.8(H) 4.6  AST 0 - 40 IU/L 17 22  ALT 0 - 44 IU/L 23 22  Alk Phosphatase 39 - 117 IU/L 222(H) 215(H)  Total Bilirubin 0.0 - 1.2 mg/dL 0.4 0.5     The 10-year ASCVD risk score Mikey Bussing DC Jr., et al., 2013) is: 21.2%   Values used to calculate the score:     Age: 33 years     Sex: Male     Is Non-Hispanic African American: Yes     Diabetic: No     Tobacco smoker: No     Systolic Blood Pressure: 786 mmHg     Is BP treated: Yes     HDL Cholesterol: 41 mg/dL     Total Cholesterol: 169 mg/dL   Patient has failed these meds in past: N/A Patient is currently uncontrolled on the  following medications:  . N/A  We discussed:   Diet and exercise extensively o Recommend pt limit fried foods/fatty foods  Plan Continue control with diet and exercise  Monitor lipid panel. May need to add statin medication if cholesterol worsens.  Hypertension   BP goal is:  <130/80  Office blood pressures are  BP Readings from Last 3 Encounters:  03/20/20 122/82  10/10/19 122/76  08/06/19 131/73   Patient checks BP at home daily Patient home BP readings are ranging:128/73, 129/76, 125/72, 122/76, 126/74, 125/70  Patient has failed these meds in the past: Amlodipine Patient is currently controlled on the following medications:  . Lisinopril 34m daily . Hydrochlorothiazide 12.544mdaily  We discussed: Diet and exercise extensively o Recommend pt limit salt intake and be mindful of salt in canned foods and processed foods . Reviewed home BP monitoring technique with pt o He demonstrated good understanding of proper technique . Pt denies headaches or dizziness  Plan Continue current medications   Overactive Bladder   Patient has failed these meds in past: N/A Patient is currently controlled on the following medications:   Oxybutynin XL 1552maily at bedtime  We discussed:  Pt has been advised to make sure he fully empties bladder each time he urinates  He also tries to make sure he uses the restroom before long drives/trips  When he has to urinate, he has to go immediately  He states the he thinks oxybutynin helps  Plan Continue current medications  Consitpation   Patient has failed these meds in past: Miralax, Colace, stimulant laxatives Patient is currently controlled on the following medications:   Linzess 145m34maily before breakfast  We discussed:    Pt states that Linzess is the only things that helps with his constipation  He takes it about 3 times per week, not daily  If he doesn't take for several days, he notices harder  stools  Provided pt samples of Linzess in office today  Discussed that I could help pt reapply for patient assistance next year to see if he could get approved (has previously been denied)  Plan Continue current medications  Health Maintenance   Patient is currently on the following medications:  . Aspirin 81mg62mly . Ferrous sulfate 325mg 50m daily . Flaxseed oil 1000mg d88m . Cholecalciferol 1000 units daily . Garlic daily  We discussed:   .  Pt denies bleeding bruising . Pt denies any stomach/nausea issues since starting iron  Plan Continue current medications   Vaccines   Reviewed and discussed patient's vaccination history.  No [facility] records available  Immunization History  Administered Date(s) Administered  . Influenza, High Dose Seasonal PF 06/14/2019  . Influenza-Unspecified 03/16/2014  . PFIZER SARS-COV-2 Vaccination 08/28/2019, 09/17/2019  . Pneumococcal Polysaccharide-23 03/14/2014, 04/13/2018  . Td 08/16/1996    Plan Discuss Shingrix, Tdap, and PCV13 at follow up  Medication Management   [facility] Uses pill box? Yes Pt endorses 100% compliance  We discussed:  . Importance of taking each medications daily as directed  Plan Continue current medication management strategy  Discuss medication synchronization and adherence packaging at follow up   Follow up: 5 month phone visit  Jannette Fogo, PharmD Clinical Pharmacist [facility] [phone]

## 2020-03-22 ENCOUNTER — Other Ambulatory Visit: Payer: Self-pay | Admitting: Internal Medicine

## 2020-03-26 ENCOUNTER — Other Ambulatory Visit: Payer: Self-pay | Admitting: Nurse Practitioner

## 2020-03-26 DIAGNOSIS — I10 Essential (primary) hypertension: Secondary | ICD-10-CM

## 2020-04-03 ENCOUNTER — Telehealth: Payer: Self-pay

## 2020-04-03 NOTE — Telephone Encounter (Cosign Needed)
°  Chronic Care Management   Outreach Note  04/03/2020 Name: Kevin Weiss MRN: [id number] DOB: 04-01-1941  Referred by: Shelby Mattocks, PA-C Reason for referral : No chief complaint on file.   An unsuccessful telephone outreach was attempted today. The patient was referred to the case management team for assistance with care management and care coordination.   Follow Up Plan: A HIPPA compliant phone message was left for the patient providing contact information and requesting a return call.  Telephone follow up appointment with care management team member scheduled for: 05/08/20  Barb Merino, RN, BSN, CCM Care Management Coordinator [place] [facility]  Direct Phone: [phone]

## 2020-04-07 ENCOUNTER — Encounter: Payer: Self-pay | Admitting: Nurse Practitioner

## 2020-04-08 ENCOUNTER — Ambulatory Visit (INDEPENDENT_AMBULATORY_CARE_PROVIDER_SITE_OTHER): Payer: Medicare HMO | Admitting: Nurse Practitioner

## 2020-04-08 ENCOUNTER — Other Ambulatory Visit: Payer: Self-pay

## 2020-04-08 ENCOUNTER — Encounter: Payer: Self-pay | Admitting: Nurse Practitioner

## 2020-04-08 VITALS — BP 122/76 | HR 84 | Temp 98.0°F | Ht 67.0 in | Wt 248.8 lb

## 2020-04-08 DIAGNOSIS — E782 Mixed hyperlipidemia: Secondary | ICD-10-CM

## 2020-04-08 DIAGNOSIS — D508 Other iron deficiency anemias: Secondary | ICD-10-CM | POA: Diagnosis not present

## 2020-04-08 DIAGNOSIS — R21 Rash and other nonspecific skin eruption: Secondary | ICD-10-CM

## 2020-04-08 DIAGNOSIS — R7303 Prediabetes: Secondary | ICD-10-CM | POA: Diagnosis not present

## 2020-04-08 DIAGNOSIS — Z0000 Encounter for general adult medical examination without abnormal findings: Secondary | ICD-10-CM

## 2020-04-08 DIAGNOSIS — I10 Essential (primary) hypertension: Secondary | ICD-10-CM

## 2020-04-08 LAB — POCT URINALYSIS DIPSTICK
Bilirubin, UA: NEGATIVE
Glucose, UA: NEGATIVE
Ketones, UA: NEGATIVE
Leukocytes, UA: NEGATIVE
Nitrite, UA: NEGATIVE
Protein, UA: NEGATIVE
Spec Grav, UA: 1.025 (ref 1.010–1.025)
Urobilinogen, UA: 0.2 E.U./dL
pH, UA: 5.5 (ref 5.0–8.0)

## 2020-04-08 LAB — POCT UA - MICROALBUMIN
Albumin/Creatinine Ratio, Urine, POC: 30
Creatinine, POC: 300 mg/dL
Microalbumin Ur, POC: 10 mg/L

## 2020-04-08 NOTE — Patient Instructions (Addendum)
Health Maintenance After Age 77 After age 77, you are at a higher risk for certain long-term diseases and infections as well as injuries from falls. Falls are a major cause of broken bones and head injuries in people who are older than age 77. Getting regular preventive care can help to keep you healthy and well. Preventive care includes getting regular testing and making lifestyle changes as recommended by your health care provider. Talk with your health care provider about:  Which screenings and tests you should have. A screening is a test that checks for a disease when you have no symptoms.  A diet and exercise plan that is right for you. What should I know about screenings and tests to prevent falls? Screening and testing are the best ways to find a health problem early. Early diagnosis and treatment give you the best chance of managing medical conditions that are common after age 77. Certain conditions and lifestyle choices may make you more likely to have a fall. Your health care provider may recommend:  Regular vision checks. Poor vision and conditions such as cataracts can make you more likely to have a fall. If you wear glasses, make sure to get your prescription updated if your vision changes.  Medicine review. Work with your health care provider to regularly review all of the medicines you are taking, including over-the-counter medicines. Ask your health care provider about any side effects that may make you more likely to have a fall. Tell your health care provider if any medicines that you take make you feel dizzy or sleepy.  Osteoporosis screening. Osteoporosis is a condition that causes the bones to get weaker. This can make the bones weak and cause them to break more easily.  Blood pressure screening. Blood pressure changes and medicines to control blood pressure can make you feel dizzy.  Strength and balance checks. Your health care provider may recommend certain tests to check your  strength and balance while standing, walking, or changing positions.  Foot health exam. Foot pain and numbness, as well as not wearing proper footwear, can make you more likely to have a fall.  Depression screening. You may be more likely to have a fall if you have a fear of falling, feel emotionally low, or feel unable to do activities that you used to do.  Alcohol use screening. Using too much alcohol can affect your balance and may make you more likely to have a fall. What actions can I take to lower my risk of falls? General instructions  Talk with your health care provider about your risks for falling. Tell your health care provider if: ? You fall. Be sure to tell your health care provider about all falls, even ones that seem minor. ? You feel dizzy, sleepy, or off-balance.  Take over-the-counter and prescription medicines only as told by your health care provider. These include any supplements.  Eat a healthy diet and maintain a healthy weight. A healthy diet includes low-fat dairy products, low-fat (lean) meats, and fiber from whole grains, beans, and lots of fruits and vegetables. Home safety  Remove any tripping hazards, such as rugs, cords, and clutter.  Install safety equipment such as grab bars in bathrooms and safety rails on stairs.  Keep rooms and walkways well-lit. Activity   Follow a regular exercise program to stay fit. This will help you maintain your balance. Ask your health care provider what types of exercise are appropriate for you.  If you need a cane or   walker, use it as recommended by your health care provider.  Wear supportive shoes that have nonskid soles. Lifestyle  Do not drink alcohol if your health care provider tells you not to drink.  If you drink alcohol, limit how much you have: ? 0-1 drink a day for women. ? 0-2 drinks a day for men.  Be aware of how much alcohol is in your drink. In the [place] one drink equals one typical bottle of beer (12  oz), one-half glass of wine (5 oz), or one shot of hard liquor (1 oz).  Do not use any products that contain nicotine or tobacco, such as cigarettes and e-cigarettes. If you need help quitting, ask your health care provider. Summary  Having a healthy lifestyle and getting preventive care can help to protect your health and wellness after age 36.  Screening and testing are the best way to find a health problem early and help you avoid having a fall. Early diagnosis and treatment give you the best chance for managing medical conditions that are more common for people who are older than age 75.  Falls are a major cause of broken bones and head injuries in people who are older than age 36. Take precautions to prevent a fall at home.  Work with your health care provider to learn what changes you can make to improve your health and wellness and to prevent falls. This information is not intended to replace advice given to you by your health care provider. Make sure you discuss any questions you have with your health care provider. Document Revised: 11/23/2018 Document Reviewed: 06/15/2017 Elsevier Patient Education  [place].  Use lubriderm or eucerin cream to your legs.

## 2020-04-08 NOTE — Progress Notes (Signed)
I,Yamilka Roman [employer] as a Education administrator for [employer], FNP.,have documented all relevant documentation on the behalf of [name], FNP,as directed by  [name], FNP while in the presence of [name], [place]. This visit occurred during the SARS-CoV-2 public health emergency.  Safety protocols were in place, including screening questions prior to the visit, additional usage of staff PPE, and extensive cleaning of exam room while observing appropriate contact time as indicated for disinfecting solutions.  Subjective:     Patient ID: Kevin Weiss , male    DOB: [birth year] , 79 y.o.   MRN: [id number]   Chief Complaint  Patient presents with  . Annual Exam  . Rash    patient has been having some itching on both his legs and he stated has been using some cortisone cream on it.     HPI  Patient here HM   He is working on getting his Linzess covered under patient assistance, Loma Sousa is working with him. This is working for his constipation.    Rash This is a new problem. The current episode started more than 1 month ago. Location: right leg. He was exposed to nothing. Past treatments include topical steroids (neosporin and has used alcohol). The treatment provided no relief.  Hypertension This is a new problem. The current episode started more than 1 year ago. The problem has been gradually improving since onset. The problem is controlled. Pertinent negatives include no chest pain or palpitations. There are no known risk factors for coronary artery disease. Past treatments include nothing. There are no compliance problems.  There is no history of angina. There is no history of chronic renal disease.     Past Medical History:  Diagnosis Date  . Arthritis   . Cardiomegaly 10/13/2006   Qualifier: Diagnosis of  By: Eusebio Friendly    . CHF, EJECTION FRACTION > OR = 50% 10/13/2006   Qualifier: Diagnosis of  By: Eusebio Friendly    . Degenerative joint disease (DJD) of hip  03/12/2014  . GASTROESOPHAGEAL REFLUX, NO ESOPHAGITIS 10/13/2006   patient denies  . HEMORRHOIDS, NOS 10/13/2006   Qualifier: Diagnosis of  By: Eusebio Friendly    . History of colon polyps   . Hypertension    takes Amlodipine and Prinizide daily  . 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 Morbid obesity ([place]) 11/03/2015  . OSA on CPAP 11/03/2015  . Retrognathia 11/03/2015     Family History  Problem Relation Age of Onset  . Hypertension Mother   . Hyperlipidemia Mother   . Alzheimer's disease Mother   . Heart disease Father   . Hyperlipidemia Father   . Hypertension Father   . Hypertension Sister   . Hypertension Brother   . Heart attack Maternal Grandmother      Current Outpatient Medications:  .  aspirin 81 MG tablet, Take 81 mg by mouth daily., Disp: , Rfl:  .  Cholecalciferol (VITAMIN D-3) 1000 UNITS CAPS, Take 1 capsule by mouth daily., Disp: , Rfl:  .  ferrous sulfate (CVS IRON) 325 (65 FE) MG tablet, TAKE 1 TABLET (325 MG TOTAL) BY MOUTH 3 (THREE) TIMES DAILY WITH MEALS., Disp: 90 tablet, Rfl: 1 .  Flaxseed, Linseed, (FLAXSEED OIL) 1000 MG CAPS, Take by mouth., Disp: , Rfl:  .  Garlic (GARLIQUE PO), Take by mouth., Disp: , Rfl:  .  hydrochlorothiazide (MICROZIDE) 12.5 MG capsule, TAKE 1 CAPSULE BY MOUTH EVERY DAY, Disp: 90  capsule, Rfl: 0 .  linaclotide (LINZESS) 145 MCG CAPS capsule, Take 145 mcg by mouth daily before breakfast., Disp: , Rfl:  .  lisinopril (ZESTRIL) 10 MG tablet, TAKE 1 TABLET BY MOUTH EVERY DAY, Disp: 90 tablet, Rfl: 0 .  oxybutynin (DITROPAN XL) 15 MG 24 hr tablet, Take 15 mg by mouth at bedtime. (Patient not taking: Reported on 04/07/2020), Disp: , Rfl:    No Known Allergies   Men's preventive visit. Patient Health Questionnaire (PHQ-2) is    Clinical Support from 03/20/2020 in [facility]  PHQ-2 Total Score 0     Patient is on a regular diet.  He is exercising by doing this around  the house and trying not to sit for long periods.  Doing projects around the house.  Marital status: Married. Relevant history for alcohol use is:  Social History   Substance and Sexual Activity  Alcohol Use Not Currently   Comment: rarely   Relevant history for tobacco use is:  Social History   Tobacco Use  Smoking Status Former Smoker  . Packs/day: 0.25  . Years: 10.00  . Pack years: 2.50  Smokeless Tobacco Never Used  Tobacco Comment   40 + years  .   Review of Systems  Constitutional: Negative.   HENT: Negative.   Eyes: Negative.   Respiratory: Negative.   Cardiovascular: Negative.  Negative for chest pain, palpitations and leg swelling.  Gastrointestinal: Negative.   Endocrine: Negative.   Genitourinary: Negative.   Musculoskeletal: Negative.   Skin: Positive for rash (lower legs).  Neurological: Negative.   Hematological: Negative.   Psychiatric/Behavioral: Negative.      Today's Vitals   04/08/20 1125  BP: 122/76  Pulse: 84  Temp: 98 F (36.7 C)  Weight: 248 lb 12.8 oz (112.9 kg)  Height: 5' 7"  (1.702 m)  PainSc: 0-No pain   Body mass index is 38.97 kg/m.   Objective:  Physical Exam Vitals reviewed.  Constitutional:      General: He is not in acute distress.    Appearance: Normal appearance. He is obese.  HENT:     Head: Normocephalic and atraumatic.     Right Ear: Tympanic membrane, ear canal and external ear normal. There is no impacted cerumen.     Left Ear: Tympanic membrane, ear canal and external ear normal. There is no impacted cerumen.  Cardiovascular:     Rate and Rhythm: Normal rate and regular rhythm.     Pulses: Normal pulses.     Heart sounds: Normal heart sounds. No murmur heard.   Pulmonary:     Effort: Pulmonary effort is normal. No respiratory distress.     Breath sounds: Normal breath sounds.  Abdominal:     General: Abdomen is flat. Bowel sounds are normal. There is no distension.     Palpations: Abdomen is soft.   Genitourinary:    Prostate: Normal.     Rectum: Guaiac result negative.  Musculoskeletal:        General: Normal range of motion.     Cervical back: Normal range of motion and neck supple.  Skin:    General: Skin is warm and dry.     Capillary Refill: Capillary refill takes less than 2 seconds.     Findings: Rash (bilateral lower extremities are dry and scaly) present.  Neurological:     General: No focal deficit present.     Mental Status: He is alert and oriented to person, place, and time.  Cranial Nerves: No cranial nerve deficit.  Psychiatric:        Mood and Affect: Mood normal.        Behavior: Behavior normal.        Thought Content: Thought content normal.        Judgment: Judgment normal.         Assessment And Plan:    1. Encounter for health maintenance examination . Behavior modifications discussed and diet history reviewed.   . Pt will continue to exercise regularly and modify diet with low GI, plant based foods and decrease intake of processed foods.  . Recommend intake of daily multivitamin, Vitamin D, and calcium.  . Recommend for preventive screenings, as well as recommend immunizations that include TDAP (up to date)  2. HYPERTENSION, BENIGN SYSTEMIC . B/P is well controlled.  . CMP ordered to check renal function.  . The importance of regular exercise and dietary modification was stressed to the patient.   . EKG done with Sinus rhythm with right bundle branch block, no change from previous with HR 74 - POCT Urinalysis Dipstick (81002) - POCT UA - Microalbumin - EKG 12-Lead - CMP14+EGFR  3. Prediabetes  Chronic, stable  Continue with healthy diet and exercise - Hemoglobin A1c - CBC  4. Other iron deficiency anemia  Stable, may take iron supplement once a day due to increased constipation  5. Mixed hyperlipidemia  Chronic, controlled  Continue with current medications, tolerating medications well - Lipid panel  6. Rash and nonspecific  skin eruption Bilateral lower extremities are dry and scaly He is to use the steroid cream he has at home, if not better return call to office    Patient was given opportunity to ask questions. Patient verbalized understanding of the plan and was able to repeat key elements of the plan. All questions were answered to their satisfaction.   [name], FNP   I, [name], FNP, have reviewed all documentation for this visit. The documentation on 05/07/20 for the exam, diagnosis, procedures, and orders are all accurate and complete.  THE PATIENT IS ENCOURAGED TO PRACTICE SOCIAL DISTANCING DUE TO THE COVID-19 PANDEMIC.

## 2020-04-09 LAB — CMP14+EGFR
ALT: 36 IU/L (ref 0–44)
AST: 25 IU/L (ref 0–40)
Albumin/Globulin Ratio: 1.7 (ref 1.2–2.2)
Albumin: 4.7 g/dL (ref 3.7–4.7)
Alkaline Phosphatase: 217 IU/L — ABNORMAL HIGH (ref 48–121)
BUN/Creatinine Ratio: 10 (ref 10–24)
BUN: 11 mg/dL (ref 8–27)
Bilirubin Total: 0.4 mg/dL (ref 0.0–1.2)
CO2: 23 mmol/L (ref 20–29)
Calcium: 9.8 mg/dL (ref 8.6–10.2)
Chloride: 99 mmol/L (ref 96–106)
Creatinine, Ser: 1.14 mg/dL (ref 0.76–1.27)
GFR calc Af Amer: 72 mL/min/{1.73_m2} (ref >59)
GFR calc non Af Amer: 62 mL/min/{1.73_m2} (ref >59)
Globulin, Total: 2.7 g/dL (ref 1.5–4.5)
Glucose: 72 mg/dL (ref 65–99)
Potassium: 4 mmol/L (ref 3.5–5.2)
Sodium: 139 mmol/L (ref 134–144)
Total Protein: 7.4 g/dL (ref 6.0–8.5)

## 2020-04-09 LAB — CBC
Hematocrit: 45.4 % (ref 37.5–51.0)
Hemoglobin: 15.7 g/dL (ref 13.0–17.7)
MCH: 29.9 pg (ref 26.6–33.0)
MCHC: 34.6 g/dL (ref 31.5–35.7)
MCV: 87 fL (ref 79–97)
Platelets: 223 10*3/uL (ref 150–450)
RBC: 5.25 x10E6/uL (ref 4.14–5.80)
RDW: 14.1 % (ref 11.6–15.4)
WBC: 6.1 10*3/uL (ref 3.4–10.8)

## 2020-04-09 LAB — LIPID PANEL
Chol/HDL Ratio: 4.4 ratio (ref 0.0–5.0)
Cholesterol, Total: 205 mg/dL — ABNORMAL HIGH (ref 100–199)
HDL: 47 mg/dL (ref >39)
LDL Chol Calc (NIH): 143 mg/dL — ABNORMAL HIGH (ref 0–99)
Triglycerides: 82 mg/dL (ref 0–149)
VLDL Cholesterol Cal: 15 mg/dL (ref 5–40)

## 2020-04-09 LAB — HEMOGLOBIN A1C
Est. average glucose Bld gHb Est-mCnc: 117 mg/dL
Hgb A1c MFr Bld: 5.7 % — ABNORMAL HIGH (ref 4.8–5.6)

## 2020-04-11 ENCOUNTER — Telehealth: Payer: Self-pay

## 2020-04-11 NOTE — Telephone Encounter (Signed)
lvm for pt to return call for lab results  

## 2020-04-11 NOTE — Telephone Encounter (Signed)
-----   Message from [name], [place] sent at 04/11/2020 12:37 AM EDT ----- HgbA1c is stable. Blood levels are normal. Kidney functions are improved. Your liver functions are stable. Your cholesterol levels have increased a little are you eating more fried and fatty foods?

## 2020-05-08 ENCOUNTER — Telehealth: Payer: Medicare HMO

## 2020-05-20 ENCOUNTER — Ambulatory Visit: Payer: Medicare HMO | Attending: Internal Medicine

## 2020-05-20 DIAGNOSIS — Z23 Encounter for immunization: Secondary | ICD-10-CM

## 2020-05-20 NOTE — Progress Notes (Signed)
   Covid-19 Vaccination Clinic  Name:  Kevin Weiss    MRN: [id number] DOB: 11-03-1940  05/20/2020  [name] was observed post Covid-19 immunization for 15 minutes without incident. He was provided with Vaccine Information Sheet and instruction to access the V-Safe system.   [name] was instructed to call 911 with any severe reactions post vaccine: Marland Kitchen Difficulty breathing  . Swelling of face and throat  . A fast heartbeat  . A bad rash all over body  . Dizziness and weakness

## 2020-05-29 ENCOUNTER — Telehealth: Payer: Self-pay | Admitting: Pharmacist

## 2020-05-29 NOTE — Chronic Care Management (AMB) (Signed)
    Chronic Care Management Pharmacy Assistant   Name: JACAI KIPP  MRN: [id number] DOB: [birth year]  Reason for Encounter: Medication Review - Patient Assistance Coordination.  PCP : [name], FNP  Allergies:  No Known Allergies  Medications: Outpatient Encounter Medications as of 05/29/2020  Medication Sig Note  . aspirin 81 MG tablet Take 81 mg by mouth daily.   . Cholecalciferol (VITAMIN D-3) 1000 UNITS CAPS Take 1 capsule by mouth daily.   . ferrous sulfate (CVS IRON) 325 (65 FE) MG tablet TAKE 1 TABLET (325 MG TOTAL) BY MOUTH 3 (THREE) TIMES DAILY WITH MEALS.   Marland Kitchen Flaxseed, Linseed, (FLAXSEED OIL) 1000 MG CAPS Take by mouth.   . Garlic (GARLIQUE PO) Take by mouth.   . hydrochlorothiazide (MICROZIDE) 12.5 MG capsule TAKE 1 CAPSULE BY MOUTH EVERY DAY   . linaclotide (LINZESS) 145 MCG CAPS capsule Take 145 mcg by mouth daily before breakfast. 08/22/2019: samples  . lisinopril (ZESTRIL) 10 MG tablet TAKE 1 TABLET BY MOUTH EVERY DAY   . oxybutynin (DITROPAN XL) 15 MG 24 hr tablet Take 15 mg by mouth at bedtime. (Patient not taking: Reported on 04/07/2020)    No facility-administered encounter medications on file as of 05/29/2020.    Current Diagnosis: Patient Active Problem List   Diagnosis Date Noted  . Prediabetes 02/02/2019  . Primary osteoarthritis of left hip 04/11/2018  . Preop cardiovascular exam 03/30/2018  . Abnormal echocardiogram 03/30/2018  . Obese abdomen 07/22/2016  . Retrognathia 11/03/2015  . Morbid obesity ([place]) 11/03/2015  . Cardiac hypertrophy 11/03/2015  . OSA on CPAP 11/03/2015  . Degenerative joint disease (DJD) of hip 03/12/2014  . OBESITY, NOS 10/13/2006  . HYPERTENSION, BENIGN SYSTEMIC 10/13/2006  . CHF, EJECTION FRACTION > OR = 50% 10/13/2006  . CARDIOMEGALY 10/13/2006  . HEMORRHOIDS, NOS 10/13/2006  . GASTROESOPHAGEAL REFLUX, NO ESOPHAGITIS 10/13/2006  . 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 APNEA, SLEEP 10/13/2006      Follow-Up:   Patient Assistance Coordination - Called patient to inquire on what the company said on why they denied the [place] . Patient was not for sure what happen , he said that his income met qualifications for house hold. I told patient that I would call [employer] to get some clarification.  I also inform patient that he could pick up same sample at [name], [place] office this afternoon .   Called [facility] spoke with [employer], states that patient is missing his proof of out of pocket verification. Mr. San Jetty states that they had mailed a form for patient to fill out and sign and mail back. I told him patient did not get form, and asked if he could fax to office this morning because patient is going by PCP office for samples today. Patient could bring his proof of his out of pocket verification with him and sign form and PCP office will fax back.  [employer] states that he will fax ATTN: Janece Moore,FNP.this morning.  Called patient to inform him to go by his pharmacy to get print out, of his out of pocket verification and take with him today when he goes to PCP to get samples , also told him that he would need to sign form. Patient states he will be there after lunch.  Beverly Milch, CPP . Notified.  Judithann Sheen, [facility] Clinical Pharmacist Assistant [phone]

## 2020-06-12 ENCOUNTER — Telehealth: Payer: Medicare HMO

## 2020-06-17 ENCOUNTER — Other Ambulatory Visit: Payer: Self-pay

## 2020-06-17 DIAGNOSIS — Z23 Encounter for immunization: Secondary | ICD-10-CM

## 2020-06-17 MED ORDER — PNEUMOCOCCAL 13-VAL CONJ VACC IM SUSP
0.5000 mL | Freq: Once | INTRAMUSCULAR | 0 refills | Status: AC
Start: 2020-06-17 — End: 2020-06-17

## 2020-06-20 ENCOUNTER — Telehealth: Payer: Self-pay | Admitting: Pharmacist

## 2020-06-20 NOTE — Chronic Care Management (AMB) (Signed)
    Chronic Care Management Pharmacy Assistant   Name: Kevin Weiss  MRN: [id number] DOB: 1940/12/31  Reason for Encounter: Patient Assistance Coordination  PCP : [name], [place]    06/20/2020- Linzess expense form faxed to [facility] patient assistance program.    Allergies:  No Known Allergies  Medications: Outpatient Encounter Medications as of 06/20/2020  Medication Sig Note  . aspirin 81 MG tablet Take 81 mg by mouth daily.   . Cholecalciferol (VITAMIN D-3) 1000 UNITS CAPS Take 1 capsule by mouth daily.   . ferrous sulfate (CVS IRON) 325 (65 FE) MG tablet TAKE 1 TABLET (325 MG TOTAL) BY MOUTH 3 (THREE) TIMES DAILY WITH MEALS.   Marland Kitchen Flaxseed, Linseed, (FLAXSEED OIL) 1000 MG CAPS Take by mouth.   . Garlic (GARLIQUE PO) Take by mouth.   . hydrochlorothiazide (MICROZIDE) 12.5 MG capsule TAKE 1 CAPSULE BY MOUTH EVERY DAY   . linaclotide (LINZESS) 145 MCG CAPS capsule Take 145 mcg by mouth daily before breakfast. 08/22/2019: samples  . lisinopril (ZESTRIL) 10 MG tablet TAKE 1 TABLET BY MOUTH EVERY DAY   . oxybutynin (DITROPAN XL) 15 MG 24 hr tablet Take 15 mg by mouth at bedtime. (Patient not taking: Reported on 04/07/2020)    No facility-administered encounter medications on file as of 06/20/2020.    Current Diagnosis: Patient Active Problem List   Diagnosis Date Noted  . Prediabetes 02/02/2019  . Primary osteoarthritis of left hip 04/11/2018  . Preop cardiovascular exam 03/30/2018  . Abnormal echocardiogram 03/30/2018  . Obese abdomen 07/22/2016  . Retrognathia 11/03/2015  . Morbid obesity ([place]) 11/03/2015  . Cardiac hypertrophy 11/03/2015  . OSA on CPAP 11/03/2015  . Degenerative joint disease (DJD) of hip 03/12/2014  . OBESITY, NOS 10/13/2006  . HYPERTENSION, BENIGN SYSTEMIC 10/13/2006  . CHF, EJECTION FRACTION > OR = 50% 10/13/2006  . CARDIOMEGALY 10/13/2006  . HEMORRHOIDS, NOS 10/13/2006  . GASTROESOPHAGEAL REFLUX, NO ESOPHAGITIS 10/13/2006  . IMPOTENCE, ORGANIC  10/13/2006  . APNEA, SLEEP 10/13/2006     Follow-Up:  Patient Assistance Coordination   07/15/2020- Called patient to follow up on patient assistance status with [facility], and to see if he received his Linzess. Also called regarding Mybetriq vs Oxybutynin medication prescribed by Nephrologist, left message for patient to return call. Patient has a follow up visit with Orlando Penner, [facility] 07/23/2020.  Orlando Penner, CPP notified. Pattricia Boss, [place] Pharmacist Assistant [phone]

## 2020-06-22 ENCOUNTER — Other Ambulatory Visit: Payer: Self-pay | Admitting: Nurse Practitioner

## 2020-06-24 ENCOUNTER — Ambulatory Visit: Payer: Medicare HMO

## 2020-06-24 ENCOUNTER — Telehealth: Payer: Self-pay

## 2020-06-24 DIAGNOSIS — I10 Essential (primary) hypertension: Secondary | ICD-10-CM

## 2020-06-24 DIAGNOSIS — R7303 Prediabetes: Secondary | ICD-10-CM

## 2020-06-24 NOTE — Chronic Care Management (AMB) (Signed)
Chronic Care Management    Social Work Follow Up Note  06/24/2020 Name: Kevin Weiss MRN: [id number] DOB: Jan 27, 1941  Kevin Weiss is a 79 y.o. year old male who is a primary care patient of [name], [place]. The CCM team was consulted for assistance with care coordination.   Review of patient status, including review of consultants reports, other relevant assessments, and collaboration with appropriate care team members and the patient's provider was performed as part of comprehensive patient evaluation and provision of chronic care management services.    SDOH (Social Determinants of Health) assessments performed: No    Outpatient Encounter Medications as of 06/24/2020  Medication Sig Note  . aspirin 81 MG tablet Take 81 mg by mouth daily.   . Cholecalciferol (VITAMIN D-3) 1000 UNITS CAPS Take 1 capsule by mouth daily.   . ferrous sulfate ([facility] IRON) 325 (65 FE) MG tablet TAKE 1 TABLET (325 MG TOTAL) BY MOUTH 3 (THREE) TIMES DAILY WITH MEALS.   Marland Kitchen Flaxseed, Linseed, (FLAXSEED OIL) 1000 MG CAPS Take by mouth.   . Garlic (GARLIQUE PO) Take by mouth.   . hydrochlorothiazide (MICROZIDE) 12.5 MG capsule TAKE 1 CAPSULE BY MOUTH EVERY DAY   . linaclotide (LINZESS) 145 MCG CAPS capsule Take 145 mcg by mouth daily before breakfast. 08/22/2019: samples  . lisinopril (ZESTRIL) 10 MG tablet TAKE 1 TABLET BY MOUTH EVERY DAY   . oxybutynin (DITROPAN XL) 15 MG 24 hr tablet Take 15 mg by mouth at bedtime. (Patient not taking: Reported on 04/07/2020)    No facility-administered encounter medications on file as of 06/24/2020.     Goals Addressed            This Visit's Progress   . Collaborate with RN Care Manager to perform appropriate assessments to assist with care coordination needs       CARE PLAN ENTRY (see longitudinal plan of care for additional care plan information)  Current Barriers:  Marland Kitchen Medication questions related to Oxybutynin and Myrbertriq . Chronic conditions including  HTN, Osteoarthritis, and pre-diabetes which put patient at increased risk for admission  Social Work Clinical Goal(s):  Marland Kitchen Over the next 30 days the patient will work with primary care team to address medication related questions . Over the next 90 days the patient will work with SW to address care coordination needs  CCM SW Interventions: Completed 06/24/20 . Inter-disciplinary care team collaboration (see longitudinal plan of care) . Successful call with the patient to assess for care coordination needs . Determined the patient has medication questions in regards to switch from Oxybutynin to Myrbetriq o Patient reports Dr. Jannifer Franklin made the switch at recent [place] in the last few weeks o Patient is concerned with impact Myrbetriq may have on liver function o Patient reports his liver function had improved during last PCP lab draw and he is concerned Myrbetriq may impact it negatively o Patient reports he was given samples but is concerned with cost increase of new medication . Performed chart review to note next PharmD call scheduled for 12/28 . Advised patient SW would outreach his primary provider as well as embedded pharmacist regarding medication questions . Collaboration with [name] FNP and chronic care management pharmacy team to request patient outreach . Scheduled follow up call to the patient over the next month  Patient Self Care Activities:  . Patient verbalizes understanding of plan to contact SW as needed prior to next scheduled call . Self administers medications as prescribed . Attends all  scheduled provider appointments . Calls provider office for new concerns or questions  Initial goal documentation         Follow Up Plan: SW will follow up with patient by phone over the next month.   Daneen Schick, BSW, CDP Social Worker, Certified Dementia Practitioner [place] [facility] [place] [facility] [phone]  Total time spent performing care coordination and/or care  management activities with the patient by phone or face to face = 18 minutes.

## 2020-06-24 NOTE — Telephone Encounter (Signed)
  Chronic Care Management   Outreach Note  06/24/2020 Name: ACHILLES NEVILLE MRN: [id number] DOB: [birth year]  Referred by: [name], FNP Reason for referral : Care Coordination   An unsuccessful telephone outreach was attempted today. The patient was referred to the case management team for assistance with care management and care coordination.   Follow Up Plan: A HIPAA compliant phone message was left for the patient providing contact information and requesting a return call.  The care management team will reach out to the patient again over the next 21 days.   Daneen Schick, BSW, CDP Social Worker, Certified Dementia Practitioner [place] [facility] [place] [facility] [phone]

## 2020-06-24 NOTE — Patient Instructions (Signed)
Social Worker Visit Information  Goals we discussed today:  Goals Addressed            This Visit's Progress   . Collaborate with RN Care Manager to perform appropriate assessments to assist with care coordination needs       CARE PLAN ENTRY (see longitudinal plan of care for additional care plan information)  Current Barriers:  Marland Kitchen Medication questions related to Oxybutynin and Myrbertriq . Chronic conditions including HTN, Osteoarthritis, and pre-diabetes which put patient at increased risk for admission  Social Work Clinical Goal(s):  Marland Kitchen Over the next 30 days the patient will work with primary care team to address medication related questions . Over the next 90 days the patient will work with SW to address care coordination needs  CCM SW Interventions: Completed 06/24/20 . Inter-disciplinary care team collaboration (see longitudinal plan of care) . Successful call with the patient to assess for care coordination needs . Determined the patient has medication questions in regards to switch from Oxybutynin to Myrbetriq o Patient reports Dr. Jannifer Franklin made the switch at recent [place] in the last few weeks o Patient is concerned with impact Myrbetriq may have on liver function o Patient reports his liver function had improved during last PCP lab draw and he is concerned Myrbetriq may impact it negatively o Patient reports he was given samples but is concerned with cost increase of new medication . Performed chart review to note next PharmD call scheduled for 12/28 . Advised patient SW would outreach his primary provider as well as embedded pharmacist regarding medication questions . Collaboration with [name] FNP and chronic care management pharmacy team to request patient outreach . Scheduled follow up call to the patient over the next month  Patient Self Care Activities:  . Patient verbalizes understanding of plan to contact SW as needed prior to next scheduled call . Self administers  medications as prescribed . Attends all scheduled provider appointments . Calls provider office for new concerns or questions  Initial goal documentation       Follow Up Plan: SW will follow up with patient by phone over the next month   Daneen Schick, BSW, CDP Social Worker, Certified Dementia Practitioner [place] [facility] [place] [facility] [phone]

## 2020-06-26 ENCOUNTER — Encounter: Payer: Self-pay | Admitting: Nurse Practitioner

## 2020-06-27 ENCOUNTER — Other Ambulatory Visit: Payer: Self-pay

## 2020-06-27 ENCOUNTER — Ambulatory Visit (INDEPENDENT_AMBULATORY_CARE_PROVIDER_SITE_OTHER): Payer: Medicare HMO

## 2020-06-27 VITALS — BP 122/76 | HR 85 | Temp 98.3°F | Ht 67.0 in | Wt 250.0 lb

## 2020-06-27 DIAGNOSIS — Z23 Encounter for immunization: Secondary | ICD-10-CM | POA: Diagnosis not present

## 2020-06-27 NOTE — Progress Notes (Signed)
The pt was given his immunization for the flu.

## 2020-06-30 ENCOUNTER — Telehealth: Payer: Self-pay

## 2020-06-30 NOTE — Telephone Encounter (Signed)
The pt was notified that Laurance Flatten, [facility] will keep a watch on his liver enzymes and let him know if anything needed to be changed.  The pt said that the myrbetriq isn't working as well as the oxybutin. The pt was told to notify his urologist that the myrbetriq isn't working and that his prescription for the shingles has been faxed to his pharmacy.

## 2020-06-30 NOTE — Telephone Encounter (Signed)
-----   Message from [name], [place] sent at 06/29/2020  9:01 PM EST ----- We need to confirm who initiated this medication I have received another message about this as well. ----- Message ----- From: Michelle Nasuti, CMA Sent: 06/27/2020  10:43 AM EST To: [name], FNP  The pt said that his urologist Dr. Louis Meckel changed him from oxybutin back to [facility] because he said the myrbetriq was better.  The pt said that he was changed to the generic because it was more affordable.  The pt said that myrbetriq can cause his liver enzymes to be elveated and that at his last test the labs for his liver was improving.  The pt said that he has been taking the myrbetriq for 2 wks and wants to know if he should go back to the oxybutin or keep taking the myrbetriq.  He doesn't have to go back to the urologist until next year.

## 2020-07-05 ENCOUNTER — Other Ambulatory Visit: Payer: Self-pay | Admitting: Nurse Practitioner

## 2020-07-05 DIAGNOSIS — I10 Essential (primary) hypertension: Secondary | ICD-10-CM

## 2020-07-07 ENCOUNTER — Other Ambulatory Visit: Payer: Self-pay

## 2020-07-07 DIAGNOSIS — D508 Other iron deficiency anemias: Secondary | ICD-10-CM

## 2020-07-07 MED ORDER — FERROUS SULFATE 325 (65 FE) MG PO TABS: ORAL_TABLET | ORAL | 1 refills | Status: DC

## 2020-07-14 ENCOUNTER — Telehealth: Payer: Self-pay

## 2020-07-14 ENCOUNTER — Telehealth: Payer: Medicare HMO

## 2020-07-14 NOTE — Telephone Encounter (Signed)
  Chronic Care Management   Outreach Note  07/14/2020 Name: Kevin Weiss MRN: [id number] DOB: 1941-07-25  Referred by: [name], FNP Reason for referral : Care Coordination   An unsuccessful telephone outreach was attempted today. The patient was referred to the case management team for assistance with care management and care coordination.   Follow Up Plan: A HIPAA compliant phone message was left for the patient providing contact information and requesting a return call.  The care management team will reach out to the patient again over the next 28 days.   Daneen Schick, BSW, CDP Social Worker, Certified Dementia Practitioner [place] [facility] [place] [facility] [phone]

## 2020-07-17 ENCOUNTER — Telehealth: Payer: Self-pay

## 2020-07-17 NOTE — Chronic Care Management (AMB) (Signed)
    Chronic Care Management Pharmacy Assistant   Name: Kevin Weiss  MRN: [id number] DOB: 18-Sep-1940  Reason for Encounter: Patient Assistance Coordination  PCP : [name], [place]   07/17/2020- Called [employer] to inquire on patient Linzess patient assistance application. Spoke with representative she states application is still in progress. Inquired on how long it would take to process and if they had any vouchers patient could use to receive medication from pharmacy for free. Representative stated they do not do Vouchers.  Called patient, informed about conversation with [facility] Assist but informed that the office has samples of Linzess for patient to pick up until we know more on status of application. Patient aware I will follow up with them next week to inquire.   Allergies:  No Known Allergies  Medications: Outpatient Encounter Medications as of 07/17/2020  Medication Sig Note  . aspirin 81 MG tablet Take 81 mg by mouth daily.   . Cholecalciferol (VITAMIN D-3) 1000 UNITS CAPS Take 1 capsule by mouth daily.   . ferrous sulfate ([facility]S IRON) 325 (65 FE) MG tablet TAKE 1 TABLET (325 MG TOTAL) BY MOUTH 3 (THREE) TIMES DAILY WITH MEALS.   Marland Kitchen Flaxseed, Linseed, (FLAXSEED OIL) 1000 MG CAPS Take by mouth.   . Garlic (GARLIQUE PO) Take by mouth.   . hydrochlorothiazide (MICROZIDE) 12.5 MG capsule TAKE 1 CAPSULE BY MOUTH EVERY DAY   . linaclotide (LINZESS) 145 MCG CAPS capsule Take 145 mcg by mouth daily before breakfast. 08/22/2019: samples  . lisinopril (ZESTRIL) 10 MG tablet TAKE 1 TABLET BY MOUTH EVERY DAY   . oxybutynin (DITROPAN XL) 15 MG 24 hr tablet Take 15 mg by mouth at bedtime. (Patient not taking: Reported on 04/07/2020)   . Zoster Vaccine Adjuvanted [facility]) injection Inject 0.5 mLs into the muscle once.    No facility-administered encounter medications on file as of 07/17/2020.    Current Diagnosis: Patient Active Problem List   Diagnosis Date Noted  . Prediabetes  02/02/2019  . Primary osteoarthritis of left hip 04/11/2018  . Preop cardiovascular exam 03/30/2018  . Abnormal echocardiogram 03/30/2018  . Obese abdomen 07/22/2016  . Retrognathia 11/03/2015  . Morbid obesity ([place]) 11/03/2015  . Cardiac hypertrophy 11/03/2015  . OSA on CPAP 11/03/2015  . Degenerative joint disease (DJD) of hip 03/12/2014  . OBESITY, NOS 10/13/2006  . HYPERTENSION, BENIGN SYSTEMIC 10/13/2006  . CHF, EJECTION FRACTION > OR = 50% 10/13/2006  . CARDIOMEGALY 10/13/2006  . HEMORRHOIDS, NOS 10/13/2006  . GASTROESOPHAGEAL REFLUX, NO ESOPHAGITIS 10/13/2006  . 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 APNEA, SLEEP 10/13/2006     Follow-Up:  Patient Assistance Coordination  08/08/2020- Refaxed forms to [facility] Assist for Linzess, expense report.  Pattricia Boss, [place] Pharmacist Assistant [phone]

## 2020-07-21 ENCOUNTER — Other Ambulatory Visit: Payer: Self-pay | Admitting: Nurse Practitioner

## 2020-07-21 DIAGNOSIS — D508 Other iron deficiency anemias: Secondary | ICD-10-CM

## 2020-07-23 ENCOUNTER — Ambulatory Visit: Payer: Self-pay

## 2020-07-23 ENCOUNTER — Telehealth: Payer: Self-pay

## 2020-07-23 DIAGNOSIS — E782 Mixed hyperlipidemia: Secondary | ICD-10-CM

## 2020-07-23 DIAGNOSIS — I10 Essential (primary) hypertension: Secondary | ICD-10-CM

## 2020-07-23 DIAGNOSIS — R7303 Prediabetes: Secondary | ICD-10-CM

## 2020-07-23 NOTE — Chronic Care Management (AMB) (Signed)
Chronic Care Management Pharmacy  Name: Kevin Weiss  MRN: [id number] DOB: 12-Nov-1940  Chief Complaint/ HPI  Kevin Weiss,  79 y.o. , male presents for their Follow-Up CCM visit with the clinical pharmacist via telephone. He has been married for over 21 years and he has been retired for 11 years. He was at [employer] for over 40 years.    PCP : [name], FNP  Their chronic conditions include: Hypertension, Hyperlipidemia, Overactive Bladder and Prediabetes  Office Visits: 04/08/2020 Office Visit: Patient seen by Doreene Burke.   12/17/19 Telephone call: Pt called to confirm dosage of ferrous sulfate 352m. States that he discussed taking medication once daily with PCP and would feel more comfortable taking once daily.   10/10/19 OV: HgbA1c in prediabetic range (5.7%). Advised pt to continue low carobohydrate and no sugar diet. Pt does not check BP at home. Linzess has helped with constipation, but pt did not qualify for PAP. Continue exercising. HTN stable. Continue current medications.   Consult Visits:  111/2021- Urologist started patient on Mybertriq 50 mg  and gave patient samples. Discontinued the Ditropan XL 15 mg   CCM Encounters: 02/06/20 RN: Discussed HTN and constipation treatments plans.   11/01/19 RN: Established care plan. Evaluated current treatment plans. Advised pt to monitor BP at home.   Medications: Outpatient Encounter Medications as of 07/23/2020  Medication Sig Note   aspirin 81 MG tablet Take 81 mg by mouth daily.    Cholecalciferol (VITAMIN D-3) 1000 UNITS CAPS Take 1 capsule by mouth daily.    ferrous sulfate 325 (65 FE) MG tablet TAKE 1 TABLET BY MOUTH 3 TIMES DAILY WITH MEALS.    Flaxseed, Linseed, (FLAXSEED OIL) 1000 MG CAPS Take by mouth.    Garlic (GARLIQUE PO) Take by mouth.    hydrochlorothiazide (MICROZIDE) 12.5 MG capsule TAKE 1 CAPSULE BY MOUTH EVERY DAY    linaclotide (LINZESS) 145 MCG CAPS capsule Take 145  mcg by mouth daily before breakfast. 08/22/2019: samples   lisinopril (ZESTRIL) 10 MG tablet TAKE 1 TABLET BY MOUTH EVERY DAY    oxybutynin (DITROPAN XL) 15 MG 24 hr tablet Take 15 mg by mouth at bedtime. (Patient not taking: Reported on 04/07/2020)    Zoster Vaccine Adjuvanted ([facility] injection Inject 0.5 mLs into the muscle once.    No facility-administered encounter medications on file as of 07/23/2020.    Current Diagnosis/Assessment:    Goals Addressed   None     Prediabetes   A1c goal <5.7%  Recent Relevant Labs: Lab Results  Component Value Date/Time   HGBA1C 5.7 (H) 04/08/2020 12:59 PM   HGBA1C 5.7 (H) 10/10/2019 12:01 PM   MICROALBUR 10 04/08/2020 03:41 PM   Checking BG: Weekly  Recent FBG Readings: Recent pre-meal BG readings:  Recent 2hr PP BG readings:   Recent HS BG readings:   Patient has failed these meds in past: N/A Patient is currently controlled on the following medications:  N/A  We discussed:  Diet extensively Exercise extensively  Diet and exercise can be an effective way for pt to manage prediabetes   If not controlled by diet and exercise, condition could progress to diabetes and require medication  Plan Continue control with diet and exercise   Hyperlipidemia   LDL goal < 100  Lipid Panel     Component Value Date/Time   CHOL 205 (H) 04/08/2020 1259   TRIG 82 04/08/2020 1259   HDL 47 04/08/2020 1259   LDLCALC 143 (H)  04/08/2020 1259    Hepatic Function Latest Ref Rng & Units 04/08/2020 10/10/2019 05/15/2019  Total Protein 6.0 - 8.5 g/dL 7.4 7.3 7.2  Albumin 3.7 - 4.7 g/dL 4.7 4.8(H) 4.6  AST 0 - 40 IU/L 25 17 22   ALT 0 - 44 IU/L 36 23 22  Alk Phosphatase 48 - 121 IU/L 217(H) 222(H) 215(H)  Total Bilirubin 0.0 - 1.2 mg/dL 0.4 0.4 0.5     The 10-year ASCVD risk score Mikey Bussing DC Jr., et al., 2013) is: 22.1%   Values used to calculate the score:     Age: 89 years     Sex: Male     Is Non-Hispanic African American: Yes      Diabetic: No     Tobacco smoker: No     Systolic Blood Pressure: 315 mmHg     Is BP treated: Yes     HDL Cholesterol: 47 mg/dL     Total Cholesterol: 205 mg/dL   Patient has failed these meds in past: N/A Patient is currently uncontrolled on the following medications:   N/A  We discussed:  Patient amicable to being placed on pharmaceutical agent Diet and exercise extensively o Recommend pt limit fried foods/fatty foods o He exercises when he has the energy and the motivation to do something  - He exercises about 2-3 times per week.  - He works in his yard by blowing the leaves  - He also takes care of his cars by keeping them clean, and the maintenance   Plan Continue control with diet and exercise  -Talk with PCP about adding Crestor 10 mg tablet once a day and will increase the medication as needed.   Hypertension   BP goal is:  <130/80  Office blood pressures are  BP Readings from Last 3 Encounters:  06/27/20 122/76  04/08/20 122/76  03/20/20 122/82   Patient checks BP at home daily Patient home BP readings are ranging:128/73, 129/76, 125/72, 122/76, 126/74, 125/70  Patient has failed these meds in the past: Amlodipine Patient is currently controlled on the following medications:   Lisinopril 79m daily  Hydrochlorothiazide 12.566mdaily  We discussed: Diet and exercise extensively o Recommend pt limit salt intake and be mindful of salt in canned foods and processed foods  Reviewed home BP monitoring technique with pt o He demonstrated good understanding of proper technique  Pt denies headaches or dizziness  Plan Continue current medications   Overactive Bladder   Patient has failed these meds in past: N/A Patient is currently controlled on the following medications:   Patients urologist, Dr. HeLouis Meckeltarted him on Myrbetriq   We discussed:  Pt has been advised to make sure he fully empties bladder each time he urinates  He also tries to make  sure he uses the restroom before long drives/trips  When he has to urinate, he has to go immediately  Patient is taking Myrbetriq and does not think it is working as well as the Oxybutynin  Plan Continue current medications  Consitpation   Patient has failed these meds in past: Miralax, Colace, stimulant laxatives Patient is currently controlled on the following medications:   Linzess 14532mdaily before breakfast  We discussed:    Pt states that Linzess is the only things that helps with his constipation  He takes it about 3 times per week, not daily  If he doesn't take for several days, he notices harder stools  Patient patient assistance program application completed and waiting for approval  from the program  Patient was given samples of Linzess while we wait for approval  Plan Continue current medications  Health Maintenance   Patient is currently on the following medications:   Aspirin 36m daily  Ferrous sulfate 328monce daily  Flaxseed oil 100069maily  Cholecalciferol 1000 units daily  Garlic daily  We discussed:    Pt denies bleeding bruising  Pt denies any stomach/nausea issues since starting iron  Plan Continue current medications   Vaccines   Reviewed and discussed patient's vaccination history.  No [facility] records available  Immunization History  Administered Date(s) Administered   Fluad Quad(high Dose 65+) 06/27/2020   Influenza, High Dose Seasonal PF 06/14/2019   Influenza-Unspecified 03/16/2014   PFIZER SARS-COV-2 Vaccination 08/28/2019, 09/17/2019, 05/20/2020   Pneumococcal Conjugate-13 06/24/2020   Pneumococcal Polysaccharide-23 03/14/2014, 04/13/2018   Td 08/16/1996    Plan Discuss Shingrix, Tdap, and PCV13 at follow up  Medication Management   [facility] Uses pill box? Yes Pt endorses 100% compliance  We discussed:   Importance of taking each medications daily as  directed  Plan Continue current medication management strategy  Discuss medication synchronization and adherence packaging at follow up   Follow up: 5 month phone visit  ValOrlando PennerharmD Clinical Pharmacist [facility] [phone]

## 2020-07-23 NOTE — Telephone Encounter (Signed)
  Chronic Care Management   Outreach Note  07/23/2020 Name: Kevin Weiss MRN: [id number] DOB: [birth year]  Referred by: [name], FNP Reason for referral : [place] is enrolled in a Managed Medicaid Health Plan: No  SW received voice message from the patient requesting assistance with obtaining Linzess. Collaboration with Pattricia Boss to request patient follow up.  Follow Up Plan: SW will follow up with the patient over the next week.  Daneen Schick, BSW, CDP Social Worker, Certified Dementia Practitioner [place] [facility] [place] [facility] [phone]

## 2020-07-29 ENCOUNTER — Telehealth: Payer: Self-pay

## 2020-07-29 ENCOUNTER — Telehealth: Payer: Medicare HMO

## 2020-07-29 NOTE — Telephone Encounter (Signed)
  Chronic Care Management   Outreach Note  07/29/2020 Name: Kevin Weiss MRN: [id number] DOB: 1941/06/04  Referred by: [name], FNP Reason for referral : [place] is enrolled in a Managed Medicaid Health Plan: No  A second unsuccessful telephone outreach was attempted today. The patient was referred to the case management team for assistance with care management and care coordination.   Follow Up Plan: A HIPAA compliant phone message was left for the patient providing contact information and requesting a return call.  The care management team will reach out to the patient again over the next 30 days.   Daneen Schick, BSW, CDP Social Worker, Certified Dementia Practitioner [place] [facility] [place] [facility] [phone]

## 2020-08-04 ENCOUNTER — Telehealth: Payer: Medicare HMO

## 2020-08-06 ENCOUNTER — Other Ambulatory Visit: Payer: Self-pay | Admitting: Nurse Practitioner

## 2020-08-06 ENCOUNTER — Telehealth: Payer: Self-pay | Admitting: Nurse Practitioner

## 2020-08-06 DIAGNOSIS — E782 Mixed hyperlipidemia: Secondary | ICD-10-CM

## 2020-08-06 MED ORDER — ROSUVASTATIN CALCIUM 10 MG PO TABS: 10.0000 mg | ORAL_TABLET | Freq: Every day | ORAL | 2 refills | Status: DC

## 2020-08-06 NOTE — Telephone Encounter (Signed)
Left message for patient to call back and schedule Medicare Annual Wellness Visit (AWV) .  I wanted to see if pt could come in @ 10:45 on 10/09/20 to see nickeah prior to dr appointment.   If nickeah still has opening   awvs calender year Kevin Weiss    Last AWV 03/20/20 please schedule at anytime with [facility]    This should be a 45 minute visit.

## 2020-08-06 NOTE — Patient Instructions (Signed)
Visit Information  Goals Addressed            This Visit's Progress   . Pharmacy Care Plan       CARE PLAN ENTRY (see longitudinal plan of care for additional care plan information)  Current Barriers:  . Chronic Disease Management support, education, and care coordination needs related to Hypertension, Hyperlipidemia, and Prediabetes   Hypertension BP Readings from Last 3 Encounters:  03/20/20 122/82  10/10/19 122/76  08/06/19 131/73   . Pharmacist Clinical Goal(s): o Over the next 180 days, patient will work with PharmD and providers to maintain BP goal <130/80 . Current regimen:  . Lisinopril 10mg  daily . Hydrochlorothiazide 12.5mg  daily . Interventions: o Provided dietary and exercise recommendations o Reviewed home blood pressure monitoring technique with patient . Patient self care activities - Over the next 180 days, patient will: o Check BP daily, document, and provide at future appointments o Ensure daily salt intake < 2300 mg/day o Try to exercise for 30 minutes daily 5 times per week (150 minutes total per week) o Work up to exercise goal, start with 1-2 days per week  Hyperlipidemia Lab Results  Component Value Date/Time   LDLCALC 115 (H) 06/21/2019 02:21 PM   . Pharmacist Clinical Goal(s): o Over the next 180 days, patient will work with PharmD and providers to achieve LDL goal < 100 . Current regimen:  o N/A . Interventions: o Provided dietary and exercise recommendations o Collaborate with patients pcp team to start Crestor 10 mg one a day.  . Patient self care activities - Over the next 180 days, patient will: o Try to exercise for 30 minutes daily 5 times per week (150 minutes total per week) o Focus on a health, well-balanced diet using the PLATE method o Limit fried and fatty foods  Prediabetes Lab Results  Component Value Date/Time   HGBA1C 5.7 (H) 10/10/2019 12:01 PM   . Pharmacist Clinical Goal(s): o Over the next 180 days, patient will  work with PharmD and providers to achieve A1c goal  5.6% . Current regimen:  o N/A . Interventions: o Provided dietary and exercise recommendations o Provided patient education about the importance of diet and exercise to prevent progression of prediabetes . Patient self care activities - Over the next 180 days, patient will: o Check blood sugar weekly, document, and provide at future appointments o Contact provider with any episodes of hypoglycemia  Medication management . Pharmacist Clinical Goal(s): o Over the next 180 days, patient will work with PharmD and providers to maintain optimal medication adherence . Current pharmacy: [facility] . Interventions o Comprehensive medication review performed. o Continue current medication management strategy o Provided samples of Linzess during office visit . Patient self care activities - Over the next 180 days, patient will: o Focus on medication adherence by using pill box to organize medications o Take medications as prescribed o Report any questions or concerns to PharmD and/or provider(s)  Initial goal documentation        The patient verbalized understanding of instructions, educational materials, and care plan provided today and declined offer to receive copy of patient instructions, educational materials, and care plan.   The pharmacy team will reach out to the patient again over the next 30 days.   Mayford Knife, RPH  Dyslipidemia Dyslipidemia is an imbalance of waxy, fat-like substances (lipids) in the blood. The body needs lipids in small amounts. Dyslipidemia often involves a high level of cholesterol or triglycerides, which are  types of lipids. Common forms of dyslipidemia include:  High levels of LDL cholesterol. LDL is the type of cholesterol that causes fatty deposits (plaques) to build up in the blood vessels that carry blood away from your heart (arteries).  Low levels of HDL cholesterol. HDL cholesterol is the type of  cholesterol that protects against heart disease. High levels of HDL remove the LDL buildup from arteries.  High levels of triglycerides. Triglycerides are a fatty substance in the blood that is linked to a buildup of plaques in the arteries. What are the causes? Primary dyslipidemia is caused by changes (mutations) in genes that are passed down through families (inherited). These mutations cause several types of dyslipidemia. Secondary dyslipidemia is caused by lifestyle choices and diseases that lead to dyslipidemia, such as:  Eating a diet that is high in animal fat.  Not getting enough exercise.  Having diabetes, kidney disease, liver disease, or thyroid disease.  Drinking large amounts of alcohol.  Using certain medicines. What increases the risk? You are more likely to develop this condition if you are an older man or if you are a woman who has gone through menopause. Other risk factors include:  Having a family history of dyslipidemia.  Taking certain medicines, including birth control pills, steroids, some diuretics, and beta-blockers.  Smoking cigarettes.  Eating a high-fat diet.  Having certain medical conditions such as diabetes, polycystic ovary syndrome (PCOS), kidney disease, liver disease, or hypothyroidism.  Not exercising regularly.  Being overweight or obese with too much belly fat. What are the signs or symptoms? In most cases, dyslipidemia does not usually cause any symptoms. In severe cases, very high lipid levels can cause:  Fatty bumps under the skin (xanthomas).  White or gray ring around the black center (pupil) of the eye. Very high triglyceride levels can cause inflammation of the pancreas (pancreatitis). How is this diagnosed? Your health care provider may diagnose dyslipidemia based on a routine blood test (fasting blood test). Because most people do not have symptoms of the condition, this blood testing (lipid profile) is done on adults age 26 and  older and is repeated every 5 years. This test checks:  Total cholesterol. This measures the total amount of cholesterol in your blood, including LDL cholesterol, HDL cholesterol, and triglycerides. A healthy number is below 200.  LDL cholesterol. The target number for LDL cholesterol is different for each person, depending on individual risk factors. Ask your health care provider what your LDL cholesterol should be.  HDL cholesterol. An HDL level of 60 or higher is best because it helps to protect against heart disease. A number below 42 for men or below 59 for women increases the risk for heart disease.  Triglycerides. A healthy triglyceride number is below 150. If your lipid profile is abnormal, your health care provider may do other blood tests. How is this treated? Treatment depends on the type of dyslipidemia that you have and your other risk factors for heart disease and stroke. Your health care provider will have a target range for your lipid levels based on this information. For many people, this condition may be treated by lifestyle changes, such as diet and exercise. Your health care provider may recommend that you:  Get regular exercise.  Make changes to your diet.  Quit smoking if you smoke. If diet changes and exercise do not help you reach your goals, your health care provider may also prescribe medicine to lower lipids. The most commonly prescribed type of medicine  lowers your LDL cholesterol (statin drug). If you have a high triglyceride level, your provider may prescribe another type of drug (fibrate) or an omega-3 fish oil supplement, or both. Follow these instructions at home:  Eating and drinking  Follow instructions from your health care provider or dietitian about eating or drinking restrictions.  Eat a healthy diet as told by your health care provider. This can help you reach and maintain a healthy weight, lower your LDL cholesterol, and raise your HDL cholesterol.  This may include: ? Limiting your calories, if you are overweight. ? Eating more fruits, vegetables, whole grains, fish, and lean meats. ? Limiting saturated fat, trans fat, and cholesterol.  If you drink alcohol: ? Limit how much you use. ? Be aware of how much alcohol is in your drink. In the [place] one drink equals one 12 oz bottle of beer (355 mL), one 5 oz glass of wine (148 mL), or one 1 oz glass of hard liquor (44 mL).  Do not drink alcohol if: ? Your health care provider tells you not to drink. ? You are pregnant, may be pregnant, or are planning to become pregnant. Activity  Get regular exercise. Start an exercise and strength training program as told by your health care provider. Ask your health care provider what activities are safe for you. Your health care provider may recommend: ? 30 minutes of aerobic activity 4-6 days a week. Brisk walking is an example of aerobic activity. ? Strength training 2 days a week. General instructions  Do not use any products that contain nicotine or tobacco, such as cigarettes, e-cigarettes, and chewing tobacco. If you need help quitting, ask your health care provider.  Take over-the-counter and prescription medicines only as told by your health care provider. This includes supplements.  Keep all follow-up visits as told by your health care provider. Contact a health care provider if:  You are: ? Having trouble sticking to your exercise or diet plan. ? Struggling to quit smoking or control your use of alcohol. Summary  Dyslipidemia often involves a high level of cholesterol or triglycerides, which are types of lipids.  Treatment depends on the type of dyslipidemia that you have and your other risk factors for heart disease and stroke.  For many people, treatment starts with lifestyle changes, such as diet and exercise.  Your health care provider may prescribe medicine to lower lipids. This information is not intended to replace advice  given to you by your health care provider. Make sure you discuss any questions you have with your health care provider. Document Revised: 03/27/2018 Document Reviewed: 03/03/2018 Elsevier Patient Education  [place].  Prediabetes Eating Plan Prediabetes is a condition that causes blood sugar (glucose) levels to be higher than normal. This increases the risk for developing diabetes. In order to prevent diabetes from developing, your health care provider may recommend a diet and other lifestyle changes to help you:  Control your blood glucose levels.  Improve your cholesterol levels.  Manage your blood pressure. Your health care provider may recommend working with a diet and nutrition specialist (dietitian) to make a meal plan that is best for you. What are tips for following this plan? Lifestyle  Set weight loss goals with the help of your health care team. It is recommended that most people with prediabetes lose 7% of their current body weight.  Exercise for at least 30 minutes at least 5 days a week.  Attend a support group or seek ongoing  support from a mental health counselor.  Take over-the-counter and prescription medicines only as told by your health care provider. Reading food labels  Read food labels to check the amount of fat, salt (sodium), and sugar in prepackaged foods. Avoid foods that have: ? Saturated fats. ? Trans fats. ? Added sugars.  Avoid foods that have more than 300 milligrams (mg) of sodium per serving. Limit your daily sodium intake to less than 2,300 mg each day. Shopping  Avoid buying pre-made and processed foods. Cooking  Cook with olive oil. Do not use butter, lard, or ghee.  Bake, broil, grill, or boil foods. Avoid frying. Meal planning   Work with your dietitian to develop an eating plan that is right for you. This may include: ? Tracking how many calories you take in. Use a food diary, notebook, or mobile application to track what  you eat at each meal. ? Using the glycemic index (GI) to plan your meals. The index tells you how quickly a food will raise your blood glucose. Choose low-GI foods. These foods take a longer time to raise blood glucose.  Consider following a Mediterranean diet. This diet includes: ? Several servings each day of fresh fruits and vegetables. ? Eating fish at least twice a week. ? Several servings each day of whole grains, beans, nuts, and seeds. ? Using olive oil instead of other fats. ? Moderate alcohol consumption. ? Eating small amounts of red meat and whole-fat dairy.  If you have high blood pressure, you may need to limit your sodium intake or follow a diet such as the DASH eating plan. DASH is an eating plan that aims to lower high blood pressure. What foods are recommended? The items listed below may not be a complete list. Talk with your dietitian about what dietary choices are best for you. Grains Whole grains, such as whole-wheat or whole-grain breads, crackers, cereals, and pasta. Unsweetened oatmeal. Bulgur. Barley. Quinoa. Brown rice. Corn or whole-wheat flour tortillas or taco shells. Vegetables Lettuce. Spinach. Peas. Beets. Cauliflower. Cabbage. Broccoli. Carrots. Tomatoes. Squash. Eggplant. Herbs. Peppers. Onions. Cucumbers. Brussels sprouts. Fruits Berries. Bananas. Apples. Oranges. Grapes. Papaya. Mango. Pomegranate. Kiwi. Grapefruit. Cherries. Meats and other protein foods Seafood. Poultry without skin. Lean cuts of pork and beef. Tofu. Eggs. Nuts. Beans. Dairy Low-fat or fat-free dairy products, such as yogurt, cottage cheese, and cheese. Beverages Water. Tea. Coffee. Sugar-free or diet soda. Seltzer water. Lowfat or no-fat milk. Milk alternatives, such as soy or almond milk. Fats and oils Olive oil. Canola oil. Sunflower oil. Grapeseed oil. Avocado. Walnuts. Sweets and desserts Sugar-free or low-fat pudding. Sugar-free or low-fat ice cream and other frozen  treats. Seasoning and other foods Herbs. Sodium-free spices. Mustard. Relish. Low-fat, low-sugar ketchup. Low-fat, low-sugar barbecue sauce. Low-fat or fat-free mayonnaise. What foods are not recommended? The items listed below may not be a complete list. Talk with your dietitian about what dietary choices are best for you. Grains Refined white flour and flour products, such as bread, pasta, snack foods, and cereals. Vegetables Canned vegetables. Frozen vegetables with butter or cream sauce. Fruits Fruits canned with syrup. Meats and other protein foods Fatty cuts of meat. Poultry with skin. Breaded or fried meat. Processed meats. Dairy Full-fat yogurt, cheese, or milk. Beverages Sweetened drinks, such as sweet iced tea and soda. Fats and oils Butter. Lard. Ghee. Sweets and desserts Baked goods, such as cake, cupcakes, pastries, cookies, and cheesecake. Seasoning and other foods Spice mixes with added salt. Ketchup. Barbecue sauce. Mayonnaise.  Summary  To prevent diabetes from developing, you may need to make diet and other lifestyle changes to help control blood sugar, improve cholesterol levels, and manage your blood pressure.  Set weight loss goals with the help of your health care team. It is recommended that most people with prediabetes lose 7 percent of their current body weight.  Consider following a Mediterranean diet that includes plenty of fresh fruits and vegetables, whole grains, beans, nuts, seeds, fish, lean meat, low-fat dairy, and healthy oils. This information is not intended to replace advice given to you by your health care provider. Make sure you discuss any questions you have with your health care provider. Document Revised: 11/24/2018 Document Reviewed: 10/06/2016 Elsevier Patient Education  2020 [employer].

## 2020-08-11 ENCOUNTER — Ambulatory Visit: Payer: Medicare HMO | Admitting: Adult Health

## 2020-08-12 ENCOUNTER — Telehealth: Payer: Self-pay

## 2020-08-27 ENCOUNTER — Ambulatory Visit (INDEPENDENT_AMBULATORY_CARE_PROVIDER_SITE_OTHER): Payer: Medicare HMO | Admitting: Adult Health

## 2020-08-27 ENCOUNTER — Encounter: Payer: Self-pay | Admitting: Adult Health

## 2020-08-27 VITALS — BP 162/88 | HR 58 | Ht 67.0 in | Wt 258.0 lb

## 2020-08-27 DIAGNOSIS — G4733 Obstructive sleep apnea (adult) (pediatric): Secondary | ICD-10-CM | POA: Diagnosis not present

## 2020-08-27 DIAGNOSIS — Z9989 Dependence on other enabling machines and devices: Secondary | ICD-10-CM | POA: Diagnosis not present

## 2020-08-27 NOTE — Patient Instructions (Signed)
Continue using CPAP nightly and greater than 4 hours each night °If your symptoms worsen or you develop new symptoms please let us know.  ° °

## 2020-08-27 NOTE — Progress Notes (Signed)
PATIENT: Kevin Weiss DOB: Dec 15, 1940  REASON FOR VISIT: follow up HISTORY FROM: patient  HISTORY OF PRESENT ILLNESS: Today 08/27/20:  Kevin Weiss is a 80 year old male with a history of obstructive sleep apnea on CPAP.  His download indicates that he uses machine nightly for compliance of 100%.  He uses machine greater than 4 hours each night.  On average he uses his machine 7 hours and 53 minutes.  His residual AHI is 1.8 on 7/16 centimeters of water with EPR 3.  Reports that the CPAP is working well for him.  He denies any new issues.  Returns today for an evaluation.  08/04/20: Kevin Weiss is a 80 year old male with a history of obstructive sleep apnea on CPAP.  His download indicates that he uses machine nightly for compliance of 100%.  He uses machine greater than 4 hours each night.  On average he uses his machine 7 hours and 50 minutes.  His residual AHI is 2.8 on 7 to 16 cm of water with EPR 3.  His leak in the 95th percentile is 10 L/min.  He reports that the CPAP continues to work well for him.  He returns today for an evaluation.  HISTORY 08/02/18: Kevin Weiss is a 80 year old male with a history of obstructive sleep apnea on CPAP.  His CPAP download indicates that he uses machine 30 out of 30 days for compliance of 100%.  He uses machine greater than 4 hours each night.  On average he uses his machine 7 hours and 33 minutes.  His residual AHI is 3.6 on 7 to 16 cm of water with EPR of 3.  He does not have a significant leak.  He returns today for evaluation.  REVIEW OF SYSTEMS: Out of a complete 14 system review of symptoms, the patient complains only of the following symptoms, and all other reviewed systems are negative.  See HPI  ALLERGIES: No Known Allergies  HOME MEDICATIONS: Outpatient Medications Prior to Visit  Medication Sig Dispense Refill  . aspirin 81 MG tablet Take 81 mg by mouth daily.    . Cholecalciferol (VITAMIN D-3) 1000 UNITS CAPS Take 1 capsule by  mouth daily.    . ferrous sulfate 325 (65 FE) MG tablet TAKE 1 TABLET BY MOUTH 3 TIMES DAILY WITH MEALS. 270 tablet 1  . Flaxseed, Linseed, (FLAXSEED OIL) 1000 MG CAPS Take by mouth.    . Garlic (GARLIQUE PO) Take by mouth.    . hydrochlorothiazide (MICROZIDE) 12.5 MG capsule TAKE 1 CAPSULE BY MOUTH EVERY DAY 90 capsule 0  . linaclotide (LINZESS) 145 MCG CAPS capsule Take 145 mcg by mouth daily before breakfast.    . lisinopril (ZESTRIL) 10 MG tablet TAKE 1 TABLET BY MOUTH EVERY DAY 90 tablet 0  . oxybutynin (DITROPAN XL) 15 MG 24 hr tablet Take 15 mg by mouth at bedtime.  (Patient not taking: Reported on 07/23/2020)    . rosuvastatin (CRESTOR) 10 MG tablet Take 1 tablet (10 mg total) by mouth daily. 30 tablet 2  . Zoster Vaccine Adjuvanted [facility]) injection Inject 0.5 mLs into the muscle once.     No facility-administered medications prior to visit.    PAST MEDICAL HISTORY: Past Medical History:  Diagnosis Date  . Arthritis   . Cardiomegaly 10/13/2006   Qualifier: Diagnosis of  By: Eusebio Friendly    . CHF, EJECTION FRACTION > OR = 50% 10/13/2006   Qualifier: Diagnosis of  By: Eusebio Friendly    . Degenerative joint  disease (DJD) of hip 03/12/2014  . GASTROESOPHAGEAL REFLUX, NO ESOPHAGITIS 10/13/2006   patient denies  . HEMORRHOIDS, NOS 10/13/2006   Qualifier: Diagnosis of  By: Eusebio Friendly    . History of colon polyps   . Hypertension    takes Amlodipine and Prinizide daily  . 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 Morbid obesity ([place]) 11/03/2015  . OSA on CPAP 11/03/2015  . Retrognathia 11/03/2015    PAST SURGICAL HISTORY: Past Surgical History:  Procedure Laterality Date  . COLONOSCOPY    . KNEE ARTHROSCOPY Left 15+yrs ago  . TOTAL HIP ARTHROPLASTY Right 03/12/2014   Procedure: TOTAL HIP ARTHROPLASTY ANTERIOR APPROACH;  Surgeon: Hessie Dibble, MD;  Location: [facility];  Service: Orthopedics;  Laterality: Right;  . TOTAL HIP  ARTHROPLASTY Left 04/11/2018   Procedure: TOTAL HIP ARTHROPLASTY ANTERIOR APPROACH;  Surgeon: Melrose Nakayama, MD;  Location: [facility];  Service: Orthopedics;  Laterality: Left;    FAMILY HISTORY: Family History  Problem Relation Age of Onset  . Hypertension Mother   . Hyperlipidemia Mother   . Alzheimer's disease Mother   . Heart disease Father   . Hyperlipidemia Father   . Hypertension Father   . Hypertension Sister   . Hypertension Brother   . Heart attack Maternal Grandmother     SOCIAL HISTORY: Social History   Socioeconomic History  . Marital status: Married    Spouse name: Not on file  . Number of children: Not on file  . Years of education: Not on file  . Highest education level: Not on file  Occupational History  . Occupation: retired  Tobacco Use  . Smoking status: Former Smoker    Packs/day: 0.25    Years: 10.00    Pack years: 2.50  . Smokeless tobacco: Never Used  . Tobacco comment: 40 + years  Vaping Use  . Vaping Use: Never used  Substance and Sexual Activity  . Alcohol use: Not Currently    Comment: rarely  . Drug use: Not Currently    Types: Marijuana    Comment: 03/30/18  . Sexual activity: Not Currently  Other Topics Concern  . Not on file  Social History Narrative   Live at home with wife. Retired from [employer] as a Librarian, academic.    Social Determinants of Health   Financial Resource Strain: Medium Risk  . Difficulty of Paying Living Expenses: Somewhat hard  Food Insecurity: No Food Insecurity  . Worried About Charity fundraiser in the Last Year: Never true  . Ran Out of Food in the Last Year: Never true  Transportation Needs: No Transportation Needs  . Lack of Transportation (Medical): No  . Lack of Transportation (Non-Medical): No  Physical Activity: Inactive  . Days of Exercise per Week: 0 days  . Minutes of Exercise per Session: 0 min  Stress: No Stress Concern Present  . Feeling of Stress : Not at all  Social  Connections: Not on file  Intimate Partner Violence: Not on file      PHYSICAL EXAM  Vitals:   08/27/20 1450  BP: (!) 162/88  Pulse: (!) 58  Weight: 258 lb (117 kg)  Height: 5\' 7"  (1.702 m)   Body mass index is 40.41 kg/m.   Generalized: Well developed, in no acute distress  Chest: Lungs clear to auscultation bilaterally  Neurological examination  Mentation: Alert oriented to time, place, history taking. Follows all commands speech and  language fluent Cranial nerve II-XII: Extraocular movements were full, visual field were full on confrontational test Head turning and shoulder shrug  were normal and symmetric. Motor: The motor testing reveals 5 over 5 strength of all 4 extremities. Good symmetric motor tone is noted throughout.  Sensory: Sensory testing is intact to soft touch on all 4 extremities. No evidence of extinction is noted.  Gait and station: Gait is normal.   DIAGNOSTIC DATA (LABS, IMAGING, TESTING) - I reviewed patient records, labs, notes, testing and imaging myself where available.  Lab Results  Component Value Date   WBC 6.1 04/08/2020   HGB 15.7 04/08/2020   HCT 45.4 04/08/2020   MCV 87 04/08/2020   PLT 223 04/08/2020      Component Value Date/Time   NA 139 04/08/2020 1259   K 4.0 04/08/2020 1259   CL 99 04/08/2020 1259   CO2 23 04/08/2020 1259   GLUCOSE 72 04/08/2020 1259   GLUCOSE 138 (H) 04/12/2018 0358   BUN 11 04/08/2020 1259   CREATININE 1.14 04/08/2020 1259   CALCIUM 9.8 04/08/2020 1259   PROT 7.4 04/08/2020 1259   ALBUMIN 4.7 04/08/2020 1259   AST 25 04/08/2020 1259   ALT 36 04/08/2020 1259   ALKPHOS 217 (H) 04/08/2020 1259   BILITOT 0.4 04/08/2020 1259   GFRNONAA 62 04/08/2020 1259   GFRAA 72 04/08/2020 1259   Lab Results  Component Value Date   CHOL 205 (H) 04/08/2020   HDL 47 04/08/2020   LDLCALC 143 (H) 04/08/2020   TRIG 82 04/08/2020   CHOLHDL 4.4 04/08/2020    Lab Results  Component Value Date   TSH 1.860 05/15/2019       ASSESSMENT AND PLAN 80 y.o. year old male  has a past medical history of Arthritis, Cardiomegaly (10/13/2006), CHF, EJECTION FRACTION > OR = 50% (10/13/2006), Degenerative joint disease (DJD) of hip (03/12/2014), GASTROESOPHAGEAL REFLUX, NO ESOPHAGITIS (10/13/2006), HEMORRHOIDS, NOS (10/13/2006), History of colon polyps, Hypertension, IMPOTENCE, ORGANIC (10/13/2006), Morbid obesity ([place]) (11/03/2015), OSA on CPAP (11/03/2015), and Retrognathia (11/03/2015). here with:  1. Obstructive sleep apnea on CPAP  The patient's CPAP download shows excellent compliance and good treatment of his apnea.  He is encouraged to continue using CPAP nightly and greater than 4 hours each night.  He is advised that if his symptoms worsen or he develops new symptoms he should let us know.  He will follow-up in 1 year or sooner if needed    I spent 25 minutes of face-to-face and non-face-to-face time with patient.  This included previsit chart review, lab review, study review, order entry, electronic health record documentation, patient education.    Ward Givens, MSN, NP-C 08/27/2020, 2:49 PM [facility] [address] [place], [place] [address] [phone]

## 2020-08-29 ENCOUNTER — Ambulatory Visit: Payer: Self-pay

## 2020-08-29 ENCOUNTER — Telehealth: Payer: Medicare HMO

## 2020-08-29 DIAGNOSIS — E782 Mixed hyperlipidemia: Secondary | ICD-10-CM

## 2020-08-29 DIAGNOSIS — I10 Essential (primary) hypertension: Secondary | ICD-10-CM

## 2020-08-29 NOTE — Chronic Care Management (AMB) (Signed)
  Chronic Care Management   Outreach Note  08/29/2020 Name: Kevin Weiss MRN: [id number] DOB: November 21, 1940  Referred by: [name], FNP Reason for referral : Care Coordination   Third unsuccessful telephone outreach was attempted today. The patient was referred to the case management team for assistance with care management and care coordination. The patient's primary care provider has been notified of our unsuccessful attempts to make or maintain contact with the patient. The care management team is pleased to engage with this patient at any time in the future should he/she be interested in assistance from the care management team.   Follow Up Plan: No SW outreach planned at this time. The patient will remain active with RN Care Manager and embedded PharmD.  Daneen Schick, BSW, CDP Social Worker, Certified Dementia Practitioner [place] [facility] [place] [facility] [phone]

## 2020-09-04 ENCOUNTER — Other Ambulatory Visit: Payer: Self-pay

## 2020-09-04 ENCOUNTER — Telehealth: Payer: Self-pay

## 2020-09-04 DIAGNOSIS — D508 Other iron deficiency anemias: Secondary | ICD-10-CM

## 2020-09-04 MED ORDER — FERROUS SULFATE 325 (65 FE) MG PO TABS: ORAL_TABLET | ORAL | 1 refills | Status: DC

## 2020-09-04 NOTE — Chronic Care Management (AMB) (Signed)
Chronic Care Management Pharmacy Assistant   Name: Kevin Weiss  MRN: [id number] DOB: 1941/07/14  Reason for Encounter:   PCP : Kevin Weiss, [place]   09/04/2020-  Patient wanted to know if you suggested any other medication other than Myrbetriq and Oxybutynin for urgency, he is unable to afford they Myrbetriq but does not feel like this medication helped as well as the Oxybutynin but does not feel like the Oxybutynin was the best. He was prescribed the Myrbetriq by Urologist Dr Kevin Weiss, he is waiting on a return call from provider and he was supposed to contact PCP to discuss options. Patient states he is needing something for urgency issues, getting worse especially when he is out of the house. I have a call into Urology to follow up on medication changes, patient has not been seen by Urologist in about 2-3 months.  [name], on hold for nurse for about 10 minutes until sent to voicemail, LM on clinical triage nurse voicemail to return call.  Received a call back from Urology office, missed call. Called back and spoke with nurse, she stated they have tried to follow up with patient regarding medication but he did not call back. Per Dr Kevin Weiss they will try patient on Gemtesa and they have some samples for patient to pick up. Nurse also informed me that she just spoke with patient and he is aware.  Called patient back to follow up and to inform I have spoke with Urology office. Patient inquired if we had Gemtesa samples at the office. Informed patient that I was unaware but Urology office has some for him to pick up. Patient also asked if he could pick up Linzess samples that we had on hold for him today. Patient aware samples are at the front desk for him to pick up.  Allergies:  No Known Allergies  Medications: Outpatient Encounter Medications as of 09/04/2020  Medication Sig Note   aspirin 81 MG tablet Take 81 mg by mouth daily.    Cholecalciferol (VITAMIN D-3) 1000  UNITS CAPS Take 1 capsule by mouth daily.    ferrous sulfate ([employer] IRON) 325 (65 FE) MG tablet TAKE 1 TABLET (325 MG TOTAL) BY MOUTH 3 (THREE) TIMES DAILY WITH MEALS.    hydrochlorothiazide (MICROZIDE) 12.5 MG capsule TAKE 1 CAPSULE BY MOUTH EVERY DAY    linaclotide (LINZESS) 145 MCG CAPS capsule Take 145 mcg by mouth daily before breakfast. 08/22/2019: samples   lisinopril (ZESTRIL) 10 MG tablet TAKE 1 TABLET BY MOUTH EVERY DAY    rosuvastatin (CRESTOR) 10 MG tablet Take 1 tablet (10 mg total) by mouth daily.    Zoster Vaccine Adjuvanted [facility]) injection Inject 0.5 mLs into the muscle once.    No facility-administered encounter medications on file as of 09/04/2020.    Current Diagnosis: Patient Active Problem List   Diagnosis Date Noted   Prediabetes 02/02/2019   Primary osteoarthritis of left hip 04/11/2018   Preop cardiovascular exam 03/30/2018   Abnormal echocardiogram 03/30/2018   Obese abdomen 07/22/2016   Retrognathia 11/03/2015   Morbid obesity ([place]) 11/03/2015   Cardiac hypertrophy 11/03/2015   OSA on CPAP 11/03/2015   Degenerative joint disease (DJD) of hip 03/12/2014   OBESITY, NOS 10/13/2006   HYPERTENSION, BENIGN SYSTEMIC 10/13/2006   CHF, EJECTION FRACTION > OR = 50% 10/13/2006   CARDIOMEGALY 10/13/2006   HEMORRHOIDS, NOS 10/13/2006   GASTROESOPHAGEAL REFLUX, NO ESOPHAGITIS 10/13/2006   IMPOTENCE, 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   APNEA, SLEEP 10/13/2006  Follow-Up:  Pharmacist Review  Patient will follow up with Urology and will pick up Linzess samples. Kevin Weiss, CPP notified.  Kevin Weiss, [place] Pharmacist Assistant [phone]

## 2020-09-09 ENCOUNTER — Telehealth: Payer: Self-pay

## 2020-09-09 NOTE — Progress Notes (Signed)
09/09/20-Called and left a voicemail for the patient reminding him of appt on 1/26 at 2:00 pm. Patient was made aware in voicemail to have medications and supplements nearby during phone call with CPP, Orlando Penner.  Notified Orlando Penner, CPP.  Raynelle Highland, [place] Pharmacist Assistant [phone]

## 2020-09-10 ENCOUNTER — Other Ambulatory Visit: Payer: Medicare HMO

## 2020-09-10 ENCOUNTER — Other Ambulatory Visit: Payer: Self-pay

## 2020-09-10 ENCOUNTER — Other Ambulatory Visit: Payer: Self-pay | Admitting: Nurse Practitioner

## 2020-09-10 ENCOUNTER — Telehealth: Payer: Self-pay

## 2020-09-10 DIAGNOSIS — E782 Mixed hyperlipidemia: Secondary | ICD-10-CM

## 2020-09-10 NOTE — Progress Notes (Signed)
Check lipid panel, kidney and liver functions.

## 2020-09-11 LAB — CMP14+EGFR
ALT: 27 IU/L (ref 0–44)
AST: 28 IU/L (ref 0–40)
Albumin/Globulin Ratio: 1.7 (ref 1.2–2.2)
Albumin: 4.7 g/dL (ref 3.7–4.7)
Alkaline Phosphatase: 217 IU/L — ABNORMAL HIGH (ref 44–121)
BUN/Creatinine Ratio: 13 (ref 10–24)
BUN: 17 mg/dL (ref 8–27)
Bilirubin Total: 0.5 mg/dL (ref 0.0–1.2)
CO2: 23 mmol/L (ref 20–29)
Calcium: 9.8 mg/dL (ref 8.6–10.2)
Chloride: 94 mmol/L — ABNORMAL LOW (ref 96–106)
Creatinine, Ser: 1.34 mg/dL — ABNORMAL HIGH (ref 0.76–1.27)
GFR calc Af Amer: 59 mL/min/{1.73_m2} — ABNORMAL LOW (ref >59)
GFR calc non Af Amer: 51 mL/min/{1.73_m2} — ABNORMAL LOW (ref >59)
Globulin, Total: 2.8 g/dL (ref 1.5–4.5)
Glucose: 82 mg/dL (ref 65–99)
Potassium: 4.4 mmol/L (ref 3.5–5.2)
Sodium: 132 mmol/L — ABNORMAL LOW (ref 134–144)
Total Protein: 7.5 g/dL (ref 6.0–8.5)

## 2020-09-11 LAB — LIPID PANEL
Chol/HDL Ratio: 2.9 ratio (ref 0.0–5.0)
Cholesterol, Total: 94 mg/dL — ABNORMAL LOW (ref 100–199)
HDL: 32 mg/dL — ABNORMAL LOW (ref >39)
LDL Chol Calc (NIH): 49 mg/dL (ref 0–99)
Triglycerides: 53 mg/dL (ref 0–149)
VLDL Cholesterol Cal: 13 mg/dL (ref 5–40)

## 2020-09-27 ENCOUNTER — Other Ambulatory Visit: Payer: Self-pay | Admitting: Nurse Practitioner

## 2020-10-03 ENCOUNTER — Telehealth: Payer: Self-pay

## 2020-10-03 ENCOUNTER — Other Ambulatory Visit: Payer: Self-pay | Admitting: Nurse Practitioner

## 2020-10-03 DIAGNOSIS — E782 Mixed hyperlipidemia: Secondary | ICD-10-CM

## 2020-10-03 MED ORDER — ROSUVASTATIN CALCIUM 10 MG PO TABS: ORAL_TABLET | ORAL | 1 refills | Status: DC

## 2020-10-03 NOTE — Progress Notes (Signed)
Have him to take on MWF

## 2020-10-03 NOTE — Telephone Encounter (Signed)
Left the patient a message to call back for lab results. 

## 2020-10-03 NOTE — Telephone Encounter (Signed)
-----   Message from [name], [place] sent at 10/03/2020 12:07 AM EST ----- Your cholesterol levels are better, how many days a week are you taking your cholesterol medications? Your kidney functions are slightly declined, are you drinking water?

## 2020-10-08 ENCOUNTER — Telehealth: Payer: Self-pay | Admitting: Nurse Practitioner

## 2020-10-08 NOTE — Telephone Encounter (Signed)
I left message asking patient to call to reschedule awv appointment

## 2020-10-09 ENCOUNTER — Ambulatory Visit: Payer: Medicare HMO | Admitting: Nurse Practitioner

## 2020-10-09 ENCOUNTER — Ambulatory Visit: Payer: Medicare HMO

## 2020-10-10 ENCOUNTER — Telehealth: Payer: Self-pay

## 2020-10-10 ENCOUNTER — Telehealth: Payer: Medicare HMO

## 2020-10-10 NOTE — Telephone Encounter (Addendum)
°  Chronic Care Management   Outreach Note  10/10/2020 Name: Kevin Weiss MRN: [id number] DOB: 26-Apr-1941  Referred by: [name], FNP Reason for referral : Chronic Care Management (RN CM FU Call Attempt )   An unsuccessful telephone outreach was attempted today. I spoke with Mr. Schriver briefly today, unfortunately he is unable to speak with me at this time. The patient was referred to the case management team for assistance with care management and care coordination.   Follow Up Plan: Telephone follow up appointment with care management team member scheduled for: 10/15/20  Barb Merino, RN, BSN, CCM Care Management Coordinator [place] [facility]  Direct Phone: [phone]

## 2020-10-10 NOTE — Telephone Encounter (Signed)
-----   Message from [name], [place] sent at 10/03/2020  3:23 PM EST ----- Have him to take on MWF

## 2020-10-15 ENCOUNTER — Ambulatory Visit (INDEPENDENT_AMBULATORY_CARE_PROVIDER_SITE_OTHER): Payer: Medicare HMO

## 2020-10-15 ENCOUNTER — Telehealth: Payer: Medicare HMO

## 2020-10-15 DIAGNOSIS — E782 Mixed hyperlipidemia: Secondary | ICD-10-CM

## 2020-10-15 DIAGNOSIS — I10 Essential (primary) hypertension: Secondary | ICD-10-CM

## 2020-10-15 DIAGNOSIS — R7303 Prediabetes: Secondary | ICD-10-CM

## 2020-10-15 DIAGNOSIS — K5900 Constipation, unspecified: Secondary | ICD-10-CM

## 2020-10-17 NOTE — Patient Instructions (Signed)
Goals Addressed      Other   .  Mixed Hyperlipidemia - Disease progression minimized or avoided   On track     Timeframe:  Long-Range Goal Priority:  High Start Date: 10/15/20                            Expected End Date: 04/17/21  Next Follow Up Date: 01/07/21   - Take medications as directed - Adhere to dietary and exercise recommendations - Reviewed and discussed mailed educational materials related to lowering Cholesterol levels

## 2020-10-17 NOTE — Chronic Care Management (AMB) (Signed)
Chronic Care Management   CCM RN Visit Note  10/15/2020 Name: Kevin Weiss MRN: [id number] DOB: 08/06/1941  Subjective: Kevin Weiss is a 80 y.o. year old male who is a primary care patient of [name], [place]. The care management team was consulted for assistance with disease management and care coordination needs.    Engaged with patient by telephone for follow up visit in response to provider referral for case management and/or care coordination services.   Consent to Services:  The patient was given information about Chronic Care Management services, agreed to services, and gave verbal consent prior to initiation of services.  Please see initial visit note for detailed documentation.   Patient agreed to services and verbal consent obtained.   Assessment: Review of patient past medical history, allergies, medications, health status, including review of consultants reports, laboratory and other test data, was performed as part of comprehensive evaluation and provision of chronic care management services.   SDOH (Social Determinants of Health) assessments and interventions performed:  Yes, no acute challenges   CCM Care Plan  No Known Allergies  Outpatient Encounter Medications as of 10/15/2020  Medication Sig Note  . aspirin 81 MG tablet Take 81 mg by mouth daily.   . Cholecalciferol (VITAMIN D-3) 1000 UNITS CAPS Take 1 capsule by mouth daily.   . ferrous sulfate (CVS IRON) 325 (65 FE) MG tablet TAKE 1 TABLET (325 MG TOTAL) BY MOUTH 3 (THREE) TIMES DAILY WITH MEALS.   . hydrochlorothiazide (MICROZIDE) 12.5 MG capsule TAKE 1 CAPSULE BY MOUTH EVERY DAY   . linaclotide (LINZESS) 145 MCG CAPS capsule Take 145 mcg by mouth daily before breakfast. 08/22/2019: samples  . lisinopril (ZESTRIL) 10 MG tablet TAKE 1 TABLET BY MOUTH EVERY DAY   . rosuvastatin (CRESTOR) 10 MG tablet Take one tablet by mouth at bedtime on MWF   . Zoster Vaccine Adjuvanted [facility]) injection Inject 0.5  mLs into the muscle once.    No facility-administered encounter medications on file as of 10/15/2020.    Patient Active Problem List   Diagnosis Date Noted  . Prediabetes 02/02/2019  . Primary osteoarthritis of left hip 04/11/2018  . Preop cardiovascular exam 03/30/2018  . Abnormal echocardiogram 03/30/2018  . Obese abdomen 07/22/2016  . Retrognathia 11/03/2015  . Morbid obesity ([place]) 11/03/2015  . Cardiac hypertrophy 11/03/2015  . OSA on CPAP 11/03/2015  . Degenerative joint disease (DJD) of hip 03/12/2014  . OBESITY, NOS 10/13/2006  . HYPERTENSION, BENIGN SYSTEMIC 10/13/2006  . CHF, EJECTION FRACTION > OR = 50% 10/13/2006  . CARDIOMEGALY 10/13/2006  . HEMORRHOIDS, NOS 10/13/2006  . GASTROESOPHAGEAL REFLUX, NO ESOPHAGITIS 10/13/2006  . 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 APNEA, SLEEP 10/13/2006    Conditions to be addressed/monitored:Prediabetes, HTN, Mixed Hyperlipidemia, Constipation, unspecified  Care Plan : Mixed Hyperlipidemia  Updates made by Lynne Logan, RN since 10/17/2020 12:00 AM    Problem: Mixed Hyperlipidemia   Priority: Medium    Long-Range Goal: Mixed Hyperlipidemia - Disease progression minimized or avoided   Start Date: 10/15/2020  Expected End Date: 04/17/2021  This Visit's Progress: On track  Priority: Medium  Note:   Current Barriers:   Ineffective Self Health Maintenance  Currently UNABLE TO independently self manage needs related to chronic health conditions.   Knowledge Deficits related to short term plan for care coordination needs and long term plans for chronic disease management needs Clinical Goal(s):  Marland Kitchen Collaboration with [name], FNP regarding development and update of comprehensive plan of care  as evidenced by provider attestation and co-signature . Inter-disciplinary care team collaboration (see longitudinal plan of care)  Over the next 90 days, patient will work with care management team to address care coordination and chronic  disease management needs related to Disease Management  Educational Needs  Care Coordination  Medication Management and Education  Psychosocial Support   Interventions:  10/15/20 successful call completed with patient   Evaluation of current treatment plan related to Mixed Hyperlipidemia self-management and patient's adherence to plan as established by provider.  Collaboration with [name], FNP regarding development and update of comprehensive plan of care as evidenced by provider attestation       and co-signature  Inter-disciplinary care team collaboration (see longitudinal plan of care)  Reviewed and discussed current lipid panel results; Educated on target ranges  Educated patient on dietary and exercise recommendations  Reviewed medications with patient, determined patient is adhering to taking Rosuvastatin 10 mg q hs without noted SE   Discussed plans with patient for ongoing care management follow up and provided patient with direct contact information for care management team Self Care Activities:  . Take medications as directed . Adhere to dietary and exercise recommendations . Reviewed and discussed mailed educational materials related to lowering Cholesterol levels  Patient Goals: - to lower Cholesterol  Follow Up Plan: Telephone follow up appointment with care management team member scheduled for: 01/07/21    Plan:Telephone follow up appointment with care management team member scheduled for:  01/07/21  Barb Merino, RN, BSN, CCM Care Management Coordinator [place] [facility]  Direct Phone: [phone]

## 2020-10-24 ENCOUNTER — Telehealth: Payer: Self-pay

## 2020-10-24 NOTE — Telephone Encounter (Signed)
-----   Message from [name], [place] sent at 10/03/2020  3:23 PM EST ----- Have him to take on MWF

## 2020-10-29 ENCOUNTER — Other Ambulatory Visit: Payer: Self-pay | Admitting: Nurse Practitioner

## 2020-10-29 DIAGNOSIS — E782 Mixed hyperlipidemia: Secondary | ICD-10-CM

## 2020-10-29 DIAGNOSIS — I10 Essential (primary) hypertension: Secondary | ICD-10-CM

## 2020-11-12 ENCOUNTER — Other Ambulatory Visit: Payer: Self-pay

## 2020-11-12 ENCOUNTER — Ambulatory Visit (INDEPENDENT_AMBULATORY_CARE_PROVIDER_SITE_OTHER): Payer: Medicare HMO

## 2020-11-12 ENCOUNTER — Ambulatory Visit (INDEPENDENT_AMBULATORY_CARE_PROVIDER_SITE_OTHER): Payer: Medicare HMO | Admitting: Nurse Practitioner

## 2020-11-12 VITALS — BP 118/66 | HR 76 | Temp 98.0°F | Ht 67.0 in | Wt 253.0 lb

## 2020-11-12 DIAGNOSIS — I10 Essential (primary) hypertension: Secondary | ICD-10-CM | POA: Diagnosis not present

## 2020-11-12 DIAGNOSIS — Z0000 Encounter for general adult medical examination without abnormal findings: Secondary | ICD-10-CM

## 2020-11-12 DIAGNOSIS — E782 Mixed hyperlipidemia: Secondary | ICD-10-CM | POA: Diagnosis not present

## 2020-11-12 DIAGNOSIS — D508 Other iron deficiency anemias: Secondary | ICD-10-CM | POA: Diagnosis not present

## 2020-11-12 NOTE — Progress Notes (Signed)
Rutherford Nail as a scribe for [name], FNP.,have documented all relevant documentation on the behalf of [name], FNP,as directed by  [name], FNP while in the presence of [name], [place]. This visit occurred during the SARS-CoV-2 public health emergency.  Safety protocols were in place, including screening questions prior to the visit, additional usage of staff PPE, and extensive cleaning of exam room while observing appropriate contact time as indicated for disinfecting solutions.  Subjective:     Patient ID: Kevin Weiss , male    DOB: 06/29/1941 , 80 y.o.   MRN: [id number]   Chief Complaint  Patient presents with  . Hypertension    HPI  Pt here today for hypertension f/u pt is compilant with all meds, and has no other concerns at this time.  He has been to see [facility] - he has just started a new medication for urinary leakage. He is using samples. He is to follow up in about 30 days. [place]  Hypertension Pertinent negatives include no headaches.     Past Medical History:  Diagnosis Date  . Arthritis   . Cardiomegaly 10/13/2006   Qualifier: Diagnosis of  By: Eusebio Friendly    . CHF, EJECTION FRACTION > OR = 50% 10/13/2006   Qualifier: Diagnosis of  By: Eusebio Friendly    . Degenerative joint disease (DJD) of hip 03/12/2014  . GASTROESOPHAGEAL REFLUX, NO ESOPHAGITIS 10/13/2006   patient denies  . HEMORRHOIDS, NOS 10/13/2006   Qualifier: Diagnosis of  By: Eusebio Friendly    . History of colon polyps   . Hypertension    takes Amlodipine and Prinizide daily  . 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 Morbid obesity ([place]) 11/03/2015  . OSA on CPAP 11/03/2015  . Retrognathia 11/03/2015     Family History  Problem Relation Age of Onset  . Hypertension Mother   . Hyperlipidemia Mother   . Alzheimer's disease Mother   . Heart disease Father   . Hyperlipidemia Father   . Hypertension Father    . Hypertension Sister   . Hypertension Brother   . Heart attack Maternal Grandmother      Current Outpatient Medications:  .  aspirin 81 MG tablet, Take 81 mg by mouth daily., Disp: , Rfl:  .  Cholecalciferol (VITAMIN D-3) 1000 UNITS CAPS, Take 1 capsule by mouth daily., Disp: , Rfl:  .  ferrous sulfate ([facility] IRON) 325 (65 FE) MG tablet, TAKE 1 TABLET (325 MG TOTAL) BY MOUTH 3 (THREE) TIMES DAILY WITH MEALS. (Patient taking differently: PT TAKES AT LEAST 2 TIMES A DAY), Disp: 90 tablet, Rfl: 1 .  hydrochlorothiazide (MICROZIDE) 12.5 MG capsule, TAKE 1 CAPSULE BY MOUTH EVERY DAY, Disp: 90 capsule, Rfl: 0 .  linaclotide (LINZESS) 145 MCG CAPS capsule, Take 145 mcg by mouth daily before breakfast., Disp: , Rfl:  .  lisinopril (ZESTRIL) 10 MG tablet, TAKE 1 TABLET BY MOUTH EVERY DAY, Disp: 90 tablet, Rfl: 0 .  rosuvastatin (CRESTOR) 10 MG tablet, TAKE 1 TABLET BY MOUTH EVERY DAY, Disp: 90 tablet, Rfl: 1 .  Vibegron (GEMTESA) 75 MG TABS, Take by mouth., Disp: , Rfl:  .  Zoster Vaccine Adjuvanted [facility]) injection, Inject 0.5 mLs into the muscle once., Disp: , Rfl:    No Known Allergies   Review of Systems  Constitutional: Negative.  Negative for fatigue.  HENT: Negative.   Endocrine: Negative for polydipsia, polyphagia and polyuria.  Musculoskeletal:  Negative.   Skin: Negative.   Neurological: Negative for dizziness and headaches.  Psychiatric/Behavioral: Negative.      Today's Vitals   11/12/20 1058  BP: 118/66  Pulse: 76  Temp: 98 F (36.7 C)  TempSrc: Oral  Weight: 253 lb (114.8 kg)  Height: $Remove'5\' 7"'SmJyNMn$  (1.702 m)   Body mass index is 39.63 kg/m.  Wt Readings from Last 3 Encounters:  11/12/20 253 lb (114.8 kg)  11/12/20 253 lb (114.8 kg)  08/27/20 258 lb (117 kg)   Objective:  Physical Exam Vitals reviewed.  Constitutional:      General: He is not in acute distress.    Appearance: Normal appearance. He is obese.  HENT:     Head: Normocephalic.     Right Ear: Tympanic  membrane, ear canal and external ear normal. There is no impacted cerumen.     Left Ear: Tympanic membrane, ear canal and external ear normal. There is no impacted cerumen.     Nose: Nose normal. No congestion.     Mouth/Throat:     Mouth: Mucous membranes are moist.  Eyes:     Extraocular Movements: Extraocular movements intact.     Conjunctiva/sclera: Conjunctivae normal.     Pupils: Pupils are equal, round, and reactive to light.  Cardiovascular:     Rate and Rhythm: Normal rate and regular rhythm.     Pulses: Normal pulses.     Heart sounds: Normal heart sounds. No murmur heard.   Pulmonary:     Effort: Pulmonary effort is normal. No respiratory distress.     Breath sounds: Normal breath sounds. No wheezing.  Skin:    General: Skin is warm and dry.     Capillary Refill: Capillary refill takes less than 2 seconds.  Neurological:     General: No focal deficit present.     Mental Status: He is alert and oriented to person, place, and time.     Cranial Nerves: No cranial nerve deficit.  Psychiatric:        Mood and Affect: Mood normal.        Behavior: Behavior normal.        Thought Content: Thought content normal.        Judgment: Judgment normal.         Assessment And Plan:     1. Essential hypertension . B/P is well controlled.  . CMP ordered to check renal function.  . The importance of regular exercise and dietary modification was strongly encouraged - CMP14+EGFR  2. Mixed hyperlipidemia  Chronic, controlled  Continue with current medications, doing well with statin and is taking every other day - Lipid panel  3. Other iron deficiency anemia  Will check iron studies and is to take the iron supplement 1-2 times a day - Iron, TIBC and Ferritin Panel   He is encouraged to initially strive for BMI less than 30 to decrease cardiac risk. He is advised to exercise no less than 150 minutes per week.   Patient was given opportunity to ask questions. Patient  verbalized understanding of the plan and was able to repeat key elements of the plan. All questions were answered to their satisfaction.  [name], FNP   I, [name], FNP, have reviewed all documentation for this visit. The documentation on 11/19/20 for the exam, diagnosis, procedures, and orders are all accurate and complete.   IF YOU HAVE BEEN REFERRED TO A SPECIALIST, IT MAY TAKE 1-2 WEEKS TO SCHEDULE/PROCESS THE REFERRAL. IF YOU  HAVE NOT HEARD FROM US/SPECIALIST IN TWO WEEKS, PLEASE GIVE [place] A CALL AT [phone] X [phone].   THE PATIENT IS ENCOURAGED TO PRACTICE SOCIAL DISTANCING DUE TO THE COVID-19 PANDEMIC.

## 2020-11-12 NOTE — Patient Instructions (Signed)
Kevin Weiss , Thank you for taking time to come for your Medicare Wellness Visit. I appreciate your ongoing commitment to your health goals. Please review the following plan we discussed and let me know if I can assist you in the future.   Screening recommendations/referrals: Colonoscopy: not required Recommended yearly ophthalmology/optometry visit for glaucoma screening and checkup Recommended yearly dental visit for hygiene and checkup  Vaccinations: Influenza vaccine: completed 06/27/2020, due 03/16/2021 Pneumococcal vaccine: completed 06/24/2020 Tdap vaccine: due Shingles vaccine: discussed   Covid-19:  05/20/2020, 09/17/2019, 08/28/2019  Advanced directives: Please bring a copy of your POA (Power of Attorney) and/or Living Will to your next appointment.   Conditions/risks identified: none  Next appointment: Follow up in one year for your annual wellness visit.   Preventive Care 13 Years and Older, Male Preventive care refers to lifestyle choices and visits with your health care provider that can promote health and wellness. What does preventive care include?  A yearly physical exam. This is also called an annual well check.  Dental exams once or twice a year.  Routine eye exams. Ask your health care provider how often you should have your eyes checked.  Personal lifestyle choices, including:  Daily care of your teeth and gums.  Regular physical activity.  Eating a healthy diet.  Avoiding tobacco and drug use.  Limiting alcohol use.  Practicing safe sex.  Taking low doses of aspirin every day.  Taking vitamin and mineral supplements as recommended by your health care provider. What happens during an annual well check? The services and screenings done by your health care provider during your annual well check will depend on your age, overall health, lifestyle risk factors, and family history of disease. Counseling  Your health care provider may ask you questions about  your:  Alcohol use.  Tobacco use.  Drug use.  Emotional well-being.  Home and relationship well-being.  Sexual activity.  Eating habits.  History of falls.  Memory and ability to understand (cognition).  Work and work Statistician. Screening  You may have the following tests or measurements:  Height, weight, and BMI.  Blood pressure.  Lipid and cholesterol levels. These may be checked every 5 years, or more frequently if you are over 59 years old.  Skin check.  Lung cancer screening. You may have this screening every year starting at age 13 if you have a 30-pack-year history of smoking and currently smoke or have quit within the past 15 years.  Fecal occult blood test (FOBT) of the stool. You may have this test every year starting at age 37.  Flexible sigmoidoscopy or colonoscopy. You may have a sigmoidoscopy every 5 years or a colonoscopy every 10 years starting at age 86.  Prostate cancer screening. Recommendations will vary depending on your family history and other risks.  Hepatitis C blood test.  Hepatitis B blood test.  Sexually transmitted disease (STD) testing.  Diabetes screening. This is done by checking your blood sugar (glucose) after you have not eaten for a while (fasting). You may have this done every 1-3 years.  Abdominal aortic aneurysm (AAA) screening. You may need this if you are a current or former smoker.  Osteoporosis. You may be screened starting at age 3 if you are at high risk. Talk with your health care provider about your test results, treatment options, and if necessary, the need for more tests. Vaccines  Your health care provider may recommend certain vaccines, such as:  Influenza vaccine. This is recommended every  year.  Tetanus, diphtheria, and acellular pertussis (Tdap, Td) vaccine. You may need a Td booster every 10 years.  Zoster vaccine. You may need this after age 5.  Pneumococcal 13-valent conjugate (PCV13) vaccine.  One dose is recommended after age 79.  Pneumococcal polysaccharide (PPSV23) vaccine. One dose is recommended after age 78. Talk to your health care provider about which screenings and vaccines you need and how often you need them. This information is not intended to replace advice given to you by your health care provider. Make sure you discuss any questions you have with your health care provider. Document Released: 08/29/2015 Document Revised: 04/21/2016 Document Reviewed: 06/03/2015 Elsevier Interactive Patient Education  2017 [place] Prevention in the Home Falls can cause injuries. They can happen to people of all ages. There are many things you can do to make your home safe and to help prevent falls. What can I do on the outside of my home?  Regularly fix the edges of walkways and driveways and fix any cracks.  Remove anything that might make you trip as you walk through a door, such as a raised step or threshold.  Trim any bushes or trees on the path to your home.  Use bright outdoor lighting.  Clear any walking paths of anything that might make someone trip, such as rocks or tools.  Regularly check to see if handrails are loose or broken. Make sure that both sides of any steps have handrails.  Any raised decks and porches should have guardrails on the edges.  Have any leaves, snow, or ice cleared regularly.  Use sand or salt on walking paths during winter.  Clean up any spills in your garage right away. This includes oil or grease spills. What can I do in the bathroom?  Use night lights.  Install grab bars by the toilet and in the tub and shower. Do not use towel bars as grab bars.  Use non-skid mats or decals in the tub or shower.  If you need to sit down in the shower, use a plastic, non-slip stool.  Keep the floor dry. Clean up any water that spills on the floor as soon as it happens.  Remove soap buildup in the tub or shower regularly.  Attach bath  mats securely with double-sided non-slip rug tape.  Do not have throw rugs and other things on the floor that can make you trip. What can I do in the bedroom?  Use night lights.  Make sure that you have a light by your bed that is easy to reach.  Do not use any sheets or blankets that are too big for your bed. They should not hang down onto the floor.  Have a firm chair that has side arms. You can use this for support while you get dressed.  Do not have throw rugs and other things on the floor that can make you trip. What can I do in the kitchen?  Clean up any spills right away.  Avoid walking on wet floors.  Keep items that you use a lot in easy-to-reach places.  If you need to reach something above you, use a strong step stool that has a grab bar.  Keep electrical cords out of the way.  Do not use floor polish or wax that makes floors slippery. If you must use wax, use non-skid floor wax.  Do not have throw rugs and other things on the floor that can make you trip. What can  I do with my stairs?  Do not leave any items on the stairs.  Make sure that there are handrails on both sides of the stairs and use them. Fix handrails that are broken or loose. Make sure that handrails are as long as the stairways.  Check any carpeting to make sure that it is firmly attached to the stairs. Fix any carpet that is loose or worn.  Avoid having throw rugs at the top or bottom of the stairs. If you do have throw rugs, attach them to the floor with carpet tape.  Make sure that you have a light switch at the top of the stairs and the bottom of the stairs. If you do not have them, ask someone to add them for you. What else can I do to help prevent falls?  Wear shoes that:  Do not have high heels.  Have rubber bottoms.  Are comfortable and fit you well.  Are closed at the toe. Do not wear sandals.  If you use a stepladder:  Make sure that it is fully opened. Do not climb a closed  stepladder.  Make sure that both sides of the stepladder are locked into place.  Ask someone to hold it for you, if possible.  Clearly mark and make sure that you can see:  Any grab bars or handrails.  First and last steps.  Where the edge of each step is.  Use tools that help you move around (mobility aids) if they are needed. These include:  Canes.  Walkers.  Scooters.  Crutches.  Turn on the lights when you go into a dark area. Replace any light bulbs as soon as they burn out.  Set up your furniture so you have a clear path. Avoid moving your furniture around.  If any of your floors are uneven, fix them.  If there are any pets around you, be aware of where they are.  Review your medicines with your doctor. Some medicines can make you feel dizzy. This can increase your chance of falling. Ask your doctor what other things that you can do to help prevent falls. This information is not intended to replace advice given to you by your health care provider. Make sure you discuss any questions you have with your health care provider. Document Released: 05/29/2009 Document Revised: 01/08/2016 Document Reviewed: 09/06/2014 Elsevier Interactive Patient Education  2017 [employer].

## 2020-11-12 NOTE — Patient Instructions (Signed)

## 2020-11-12 NOTE — Progress Notes (Signed)
This visit occurred during the SARS-CoV-2 public health emergency.  Safety protocols were in place, including screening questions prior to the visit, additional usage of staff PPE, and extensive cleaning of exam room while observing appropriate contact time as indicated for disinfecting solutions.  Subjective:   Kevin Weiss is a 80 y.o. male who presents for Medicare Annual/Subsequent preventive examination.  Review of Systems     Cardiac Risk Factors include: advanced age (>93men, >5 women);hypertension;male gender;obesity (BMI >30kg/m2);sedentary lifestyle     Objective:    Today's Vitals   11/12/20 1111  BP: 118/66  Pulse: 76  Temp: 98 F (36.7 C)  TempSrc: Oral  Weight: 253 lb (114.8 kg)  Height: 5\' 7"  (1.702 m)   Body mass index is 39.63 kg/m.  Advanced Directives 11/12/2020 03/20/2020 03/08/2019 05/05/2018 04/11/2018 03/31/2018 03/13/2014  Does Patient Have a Medical Advance Directive? Yes Yes Yes Yes Yes Yes Patient does not have advance directive  Type of Advance Directive [place];Living will [place];Living will [place];Living will Living will [place];Living will [place];Living will -  Does patient want to make changes to medical advance directive? - - No - Patient declined - No - Patient declined - -  Copy of [place] in Chart? No - copy requested No - copy requested No - copy requested - - No - copy requested -    Current Medications (verified) Outpatient Encounter Medications as of 11/12/2020  Medication Sig  . aspirin 81 MG tablet Take 81 mg by mouth daily.  . Cholecalciferol (VITAMIN D-3) 1000 UNITS CAPS Take 1 capsule by mouth daily.  . ferrous sulfate ([facility] IRON) 325 (65 FE) MG tablet TAKE 1 TABLET (325 MG TOTAL) BY MOUTH 3 (THREE) TIMES DAILY WITH MEALS. (Patient taking differently: PT TAKES AT LEAST 2 TIMES A DAY)  . hydrochlorothiazide  (MICROZIDE) 12.5 MG capsule TAKE 1 CAPSULE BY MOUTH EVERY DAY  . linaclotide (LINZESS) 145 MCG CAPS capsule Take 145 mcg by mouth daily before breakfast.  . lisinopril (ZESTRIL) 10 MG tablet TAKE 1 TABLET BY MOUTH EVERY DAY  . rosuvastatin (CRESTOR) 10 MG tablet TAKE 1 TABLET BY MOUTH EVERY DAY  . Zoster Vaccine Adjuvanted [facility]) injection Inject 0.5 mLs into the muscle once.   No facility-administered encounter medications on file as of 11/12/2020.    Allergies (verified) Patient has no known allergies.   History: Past Medical History:  Diagnosis Date  . Arthritis   . Cardiomegaly 10/13/2006   Qualifier: Diagnosis of  By: Eusebio Friendly    . CHF, EJECTION FRACTION > OR = 50% 10/13/2006   Qualifier: Diagnosis of  By: Eusebio Friendly    . Degenerative joint disease (DJD) of hip 03/12/2014  . GASTROESOPHAGEAL REFLUX, NO ESOPHAGITIS 10/13/2006   patient denies  . HEMORRHOIDS, NOS 10/13/2006   Qualifier: Diagnosis of  By: Eusebio Friendly    . History of colon polyps   . Hypertension    takes Amlodipine and Prinizide daily  . 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 Morbid obesity ([place]) 11/03/2015  . OSA on CPAP 11/03/2015  . Retrognathia 11/03/2015   Past Surgical History:  Procedure Laterality Date  . COLONOSCOPY    . KNEE ARTHROSCOPY Left 15+yrs ago  . TOTAL HIP ARTHROPLASTY Right 03/12/2014   Procedure: TOTAL HIP ARTHROPLASTY ANTERIOR APPROACH;  Surgeon: Hessie Dibble, MD;  Location: [facility];  Service: Orthopedics;  Laterality: Right;  . TOTAL HIP ARTHROPLASTY Left 04/11/2018   Procedure: TOTAL HIP ARTHROPLASTY ANTERIOR APPROACH;  Surgeon: Melrose Nakayama, MD;  Location: [facility];  Service: Orthopedics;  Laterality: Left;   Family History  Problem Relation Age of Onset  . Hypertension Mother   . Hyperlipidemia Mother   . Alzheimer's disease Mother   . Heart disease Father   . Hyperlipidemia Father   . Hypertension Father   .  Hypertension Sister   . Hypertension Brother   . Heart attack Maternal Grandmother    Social History   Socioeconomic History  . Marital status: Married    Spouse name: Not on file  . Number of children: Not on file  . Years of education: Not on file  . Highest education level: Not on file  Occupational History  . Occupation: retired  Tobacco Use  . Smoking status: Former Smoker    Packs/day: 0.25    Years: 10.00    Pack years: 2.50  . Smokeless tobacco: Never Used  . Tobacco comment: 40 + years  Vaping Use  . Vaping Use: Never used  Substance and Sexual Activity  . Alcohol use: Not Currently    Comment: rarely  . Drug use: Not Currently    Types: Marijuana    Comment: 03/30/18  . Sexual activity: Not Currently  Other Topics Concern  . Not on file  Social History Narrative   Live at home with wife. Retired from [employer] as a Librarian, academic.    Social Determinants of Health   Financial Resource Strain: Low Risk   . Difficulty of Paying Living Expenses: Not hard at all  Food Insecurity: No Food Insecurity  . Worried About Charity fundraiser in the Last Year: Never true  . Ran Out of Food in the Last Year: Never true  Transportation Needs: No Transportation Needs  . Lack of Transportation (Medical): No  . Lack of Transportation (Non-Medical): No  Physical Activity: Inactive  . Days of Exercise per Week: 0 days  . Minutes of Exercise per Session: 0 min  Stress: No Stress Concern Present  . Feeling of Stress : Not at all  Social Connections: Not on file    Tobacco Counseling Counseling given: Not Answered Comment: 40 + years   Clinical Intake:  Pre-visit preparation completed: Yes  Pain : No/denies pain     Nutritional Status: BMI > 30  Obese Nutritional Risks: None Diabetes: No  How often do you need to have someone help you when you read instructions, pamphlets, or other written materials from your doctor or pharmacy?: 1 -  Never What is the last grade level you completed in school?: 12th grade  Diabetic? no  Interpreter Needed?: No  Information entered by :: NAllen LPN   Activities of Daily Living In your present state of health, do you have any difficulty performing the following activities: 11/12/2020 03/20/2020  Hearing? Y Y  Comment slight hearing loss in right ear some hearing loss in right ear  Vision? N N  Difficulty concentrating or making decisions? N N  Walking or climbing stairs? N N  Dressing or bathing? N N  Doing errands, shopping? N N  Preparing Food and eating ? N N  Using the Toilet? N N  In the past six months, have you accidently leaked urine? Y N  Do you have problems with loss of bowel control? N N  Managing your Medications? N N  Managing your  Finances? N N  Housekeeping or managing your Housekeeping? N N  Some recent data might be hidden    Patient Care Team: [name], FNP as PCP - General ([place]) Rex Kras, Claudette Stapler, RN as Case Manager Mayford Knife, [facility] (Pharmacist)  Indicate any recent Medical Services you may have received from other than [facility] providers in the past year (date may be approximate).     Assessment:   This is a routine wellness examination for [place].  Hearing/Vision screen  Hearing Screening   125Hz  250Hz  500Hz  1000Hz  2000Hz  3000Hz  4000Hz  6000Hz  8000Hz   Right ear:           Left ear:           Vision Screening Comments: Regular eye exams, [facility]  Dietary issues and exercise activities discussed: Current Exercise Habits: The patient does not participate in regular exercise at present  Goals    . Mixed Hyperlipidemia - Disease progression minimized or avoided     Timeframe:  Long-Range Goal Priority:  High Start Date: 10/15/20                            Expected End Date: 04/17/21  Next Follow Up Date: 01/07/21   - Take medications as directed - Adhere to dietary and exercise recommendations - Reviewed and discussed  mailed educational materials related to lowering Cholesterol levels                          . Patient Stated    . Patient Stated     03/20/2020, wants to lose 20 pounds    . Patient Stated     11/12/2020, get more active, wants to lose 25 pounds    . Pharmacy Care Plan     CARE PLAN ENTRY (see longitudinal plan of care for additional care plan information)  Current Barriers:  . Chronic Disease Management support, education, and care coordination needs related to Hypertension, Hyperlipidemia, and Prediabetes   Hypertension BP Readings from Last 3 Encounters:  03/20/20 122/82  10/10/19 122/76  08/06/19 131/73   . Pharmacist Clinical Goal(s): o Over the next 180 days, patient will work with PharmD and providers to maintain BP goal <130/80 . Current regimen:  . Lisinopril 10mg  daily . Hydrochlorothiazide 12.5mg  daily . Interventions: o Provided dietary and exercise recommendations o Reviewed home blood pressure monitoring technique with patient . Patient self care activities - Over the next 180 days, patient will: o Check BP daily, document, and provide at future appointments o Ensure daily salt intake < 2300 mg/day o Try to exercise for 30 minutes daily 5 times per week (150 minutes total per week) o Work up to exercise goal, start with 1-2 days per week  Hyperlipidemia Lab Results  Component Value Date/Time   LDLCALC 115 (H) 06/21/2019 02:21 PM   . Pharmacist Clinical Goal(s): o Over the next 180 days, patient will work with PharmD and providers to achieve LDL goal < 100 . Current regimen:  o N/A . Interventions: o Provided dietary and exercise recommendations o Collaborate with patients pcp team to start Crestor 10 mg one a day.  . Patient self care activities - Over the next 180 days, patient will: o Try to exercise for 30 minutes daily 5 times per week (150 minutes total per week) o Focus on a health, well-balanced diet using the PLATE method o Limit fried and fatty  foods  Prediabetes Lab Results  Component Value Date/Time   HGBA1C 5.7 (H) 10/10/2019 12:01 PM   . Pharmacist Clinical Goal(s): o Over the next 180 days, patient will work with PharmD and providers to achieve A1c goal  5.6% . Current regimen:  o N/A . Interventions: o Provided dietary and exercise recommendations o Provided patient education about the importance of diet and exercise to prevent progression of prediabetes . Patient self care activities - Over the next 180 days, patient will: o Check blood sugar weekly, document, and provide at future appointments o Contact provider with any episodes of hypoglycemia  Medication management . Pharmacist Clinical Goal(s): o Over the next 180 days, patient will work with PharmD and providers to maintain optimal medication adherence . [facility] . Interventions o Comprehensive medication review performed. o Continue current medication management strategy o Provided samples of Linzess during office visit . Patient self care activities - Over the next 180 days, patient will: o Focus on medication adherence by using pill box to organize medications o Take medications as prescribed o Report any questions or concerns to PharmD and/or provider(s)  Initial goal documentation     . Weight (lb) < 200 lb (90.7 kg)     03/08/2019 wants to get down to 220 pounds      Depression Screen PHQ 2/9 Scores 11/12/2020 03/20/2020 03/08/2019 01/23/2019 08/15/2018  PHQ - 2 Score 0 0 0 0 0  PHQ- 9 Score - 0 0 - -    Fall Risk Fall Risk  11/12/2020 03/20/2020 06/21/2019 03/08/2019 01/23/2019  Falls in the past year? 1 0 0 0 0  Comment fell off ladder - - - -  Number falls in past yr: 0 - - 0 -  Injury with Fall? 0 - - - -  Risk for fall due to : Medication side effect Medication side effect - Medication side effect -  Follow up Falls evaluation completed;Education provided;Falls prevention discussed Falls evaluation completed;Education provided;Falls  prevention discussed - Falls evaluation completed;Education provided;Falls prevention discussed -    FALL RISK PREVENTION PERTAINING TO THE HOME:  Any stairs in or around the home? Yes  If so, are there any without handrails? No  Home free of loose throw rugs in walkways, pet beds, electrical cords, etc? Yes  Adequate lighting in your home to reduce risk of falls? Yes   ASSISTIVE DEVICES UTILIZED TO PREVENT FALLS:  Life alert? No  Use of a cane, walker or w/c? No  Grab bars in the bathroom? No  Shower chair or bench in shower? No  Elevated toilet seat or a handicapped toilet? Yes   TIMED UP AND GO:  Was the test performed? No .    Gait steady and fast without use of assistive device  Cognitive Function:     6CIT Screen 11/12/2020 03/20/2020 03/08/2019  What Year? 0 points 4 points 0 points  What month? 0 points 0 points 0 points  What time? 3 points 0 points 0 points  Count back from 20 0 points 0 points 0 points  Months in reverse 0 points 0 points 0 points  Repeat phrase 2 points 2 points 0 points  Total Score 5 6 0    Immunizations Immunization History  Administered Date(s) Administered  . Fluad Quad(high Dose 65+) 06/27/2020  . Influenza, High Dose Seasonal PF 06/14/2019  . Influenza-Unspecified 03/16/2014  . PFIZER(Purple Top)SARS-COV-2 Vaccination 08/28/2019, 09/17/2019, 05/20/2020  . Pneumococcal Conjugate-13 06/24/2020  . Pneumococcal Polysaccharide-23 03/14/2014, 04/13/2018  . Td  08/16/1996    TDAP status: Due, Education has been provided regarding the importance of this vaccine. Advised may receive this vaccine at local pharmacy or Health Dept. Aware to provide a copy of the vaccination record if obtained from local pharmacy or Health Dept. Verbalized acceptance and understanding.  Flu Vaccine status: Up to date  Pneumococcal vaccine status: Up to date  Covid-19 vaccine status: Completed vaccines  Qualifies for Shingles Vaccine? Yes   Zostavax  completed No   Shingrix Completed?: No.    Education has been provided regarding the importance of this vaccine. Patient has been advised to call insurance company to determine out of pocket expense if they have not yet received this vaccine. Advised may also receive vaccine at local pharmacy or Health Dept. Verbalized acceptance and understanding.  Screening Tests Health Maintenance  Topic Date Due  . TETANUS/TDAP  03/20/2021 (Originally 08/16/2006)  . Hepatitis C Screening  03/20/2021 (Originally 1941-04-02)  . INFLUENZA VACCINE  Completed  . COVID-19 Vaccine  Completed  . PNA vac Low Risk Adult  Completed  . HPV VACCINES  Aged Out    Health Maintenance  There are no preventive care reminders to display for this patient.  Colorectal cancer screening: No longer required.   Lung Cancer Screening: (Low Dose CT Chest recommended if Age 46-80 years, 30 pack-year currently smoking OR have quit w/in 15years.) does not qualify.   Lung Cancer Screening Referral: no  Additional Screening:  Hepatitis C Screening: does qualify; decline  Vision Screening: Recommended annual ophthalmology exams for early detection of glaucoma and other disorders of the eye. Is the patient up to date with their annual eye exam?  Yes  Who is the provider or what is the name of the office in which the patient attends annual eye exams? [facility] If pt is not established with a provider, would they like to be referred to a provider to establish care? No .   Dental Screening: Recommended annual dental exams for proper oral hygiene  Community Resource Referral / Chronic Care Management: CRR required this visit?  No   CCM required this visit?  No      Plan:     I have personally reviewed and noted the following in the patient's chart:   . Medical and social history . Use of alcohol, tobacco or illicit drugs  . Current medications and supplements . Functional ability and status . Nutritional  status . Physical activity . Advanced directives . List of other physicians . Hospitalizations, surgeries, and ER visits in previous 12 months . Vitals . Screenings to include cognitive, depression, and falls . Referrals and appointments  In addition, I have reviewed and discussed with patient certain preventive protocols, quality metrics, and best practice recommendations. A written personalized care plan for preventive services as well as general preventive health recommendations were provided to patient.     Kellie Simmering, LPN   [id number]   Nurse Notes:

## 2020-11-13 LAB — LIPID PANEL
Chol/HDL Ratio: 3.3 ratio (ref 0.0–5.0)
Cholesterol, Total: 124 mg/dL (ref 100–199)
HDL: 38 mg/dL — ABNORMAL LOW (ref >39)
LDL Chol Calc (NIH): 71 mg/dL (ref 0–99)
Triglycerides: 71 mg/dL (ref 0–149)
VLDL Cholesterol Cal: 15 mg/dL (ref 5–40)

## 2020-11-13 LAB — CMP14+EGFR
ALT: 24 IU/L (ref 0–44)
AST: 27 IU/L (ref 0–40)
Albumin/Globulin Ratio: 1.8 (ref 1.2–2.2)
Albumin: 4.8 g/dL — ABNORMAL HIGH (ref 3.7–4.7)
Alkaline Phosphatase: 191 IU/L — ABNORMAL HIGH (ref 44–121)
BUN/Creatinine Ratio: 9 — ABNORMAL LOW (ref 10–24)
BUN: 13 mg/dL (ref 8–27)
Bilirubin Total: 0.5 mg/dL (ref 0.0–1.2)
CO2: 20 mmol/L (ref 20–29)
Calcium: 10 mg/dL (ref 8.6–10.2)
Chloride: 98 mmol/L (ref 96–106)
Creatinine, Ser: 1.37 mg/dL — ABNORMAL HIGH (ref 0.76–1.27)
Globulin, Total: 2.7 g/dL (ref 1.5–4.5)
Glucose: 77 mg/dL (ref 65–99)
Potassium: 4.4 mmol/L (ref 3.5–5.2)
Sodium: 137 mmol/L (ref 134–144)
Total Protein: 7.5 g/dL (ref 6.0–8.5)
eGFR: 53 mL/min/{1.73_m2} — ABNORMAL LOW (ref >59)

## 2020-11-13 LAB — IRON,TIBC AND FERRITIN PANEL
Ferritin: 160 ng/mL (ref 30–400)
Iron Saturation: 35 % (ref 15–55)
Iron: 119 ug/dL (ref 38–169)
Total Iron Binding Capacity: 338 ug/dL (ref 250–450)
UIBC: 219 ug/dL (ref 111–343)

## 2020-11-19 ENCOUNTER — Encounter: Payer: Self-pay | Admitting: Nurse Practitioner

## 2020-12-24 ENCOUNTER — Telehealth: Payer: Self-pay

## 2020-12-31 ENCOUNTER — Telehealth: Payer: Self-pay

## 2020-12-31 NOTE — Chronic Care Management (AMB) (Signed)
    Chronic Care Management Pharmacy Assistant   Name: Kevin Weiss  MRN: [id number] DOB: Mar 05, 1941  Reason for Encounter: Patient Assistance Coordination   12/30/2020- Patient requested samples of Linzess 145 mcg with scheduler, sent request to PCP office checking if samples were available.  12/31/2020- Received message back from PCP office, they do have samples available for patient. Called patient to inform, he is aware samples of LInzess 145 mcg are available for pick up. Patient also inquired about patient assistance for Linzess and he had not heard anything from [facility] assist since the being of the year. We were working on getting him patient assistance for this medication since 2021. Patient aware I will get a new application ready for signature, requested he gets updated out of pocket medication expense report from 2021 and 2022 and income documentation. Patient plans to bring this information in and to sign forms when he picks up samples on Thursday. Patient aware we will try again on helping him get some assistance on this medication. Patient appreciative for the help. Orlando Penner, CPP notified.   Medications: Outpatient Encounter Medications as of 12/31/2020  Medication Sig Note  . aspirin 81 MG tablet Take 81 mg by mouth daily.   . Cholecalciferol (VITAMIN D-3) 1000 UNITS CAPS Take 1 capsule by mouth daily.   . ferrous sulfate ([facility] IRON) 325 (65 FE) MG tablet TAKE 1 TABLET (325 MG TOTAL) BY MOUTH 3 (THREE) TIMES DAILY WITH MEALS. (Patient taking differently: PT TAKES AT LEAST 2 TIMES A DAY)   . hydrochlorothiazide (MICROZIDE) 12.5 MG capsule TAKE 1 CAPSULE BY MOUTH EVERY DAY   . linaclotide (LINZESS) 145 MCG CAPS capsule Take 145 mcg by mouth daily before breakfast. 08/22/2019: samples  . lisinopril (ZESTRIL) 10 MG tablet TAKE 1 TABLET BY MOUTH EVERY DAY   . rosuvastatin (CRESTOR) 10 MG tablet TAKE 1 TABLET BY MOUTH EVERY DAY   . Vibegron (GEMTESA) 75 MG TABS Take by mouth.    . Zoster Vaccine Adjuvanted [facility]) injection Inject 0.5 mLs into the muscle once.    No facility-administered encounter medications on file as of 12/31/2020.    Star Rating Drugs: Lisinopril 10 mg- Last filled 09/29/2020 for [facility] day supply with [facility]. Rosuvastatin 10 mg- Last filled 10/29/2020 for 90 day supply at [facility].  SIG: Pattricia Boss, [place] [phone]

## 2020-12-31 NOTE — Chronic Care Management (AMB) (Signed)
A user error has taken place: Encounter opened for wrong date, please disregard.

## 2021-01-04 ENCOUNTER — Other Ambulatory Visit: Payer: Self-pay | Admitting: Nurse Practitioner

## 2021-01-04 DIAGNOSIS — D508 Other iron deficiency anemias: Secondary | ICD-10-CM

## 2021-01-05 ENCOUNTER — Other Ambulatory Visit: Payer: Self-pay

## 2021-01-05 DIAGNOSIS — D508 Other iron deficiency anemias: Secondary | ICD-10-CM

## 2021-01-05 MED ORDER — FERROUS SULFATE 325 (65 FE) MG PO TABS: ORAL_TABLET | ORAL | 1 refills | Status: DC

## 2021-01-07 ENCOUNTER — Telehealth: Payer: Self-pay

## 2021-01-07 ENCOUNTER — Ambulatory Visit (INDEPENDENT_AMBULATORY_CARE_PROVIDER_SITE_OTHER): Payer: Medicare HMO

## 2021-01-07 ENCOUNTER — Telehealth: Payer: Medicare HMO

## 2021-01-07 DIAGNOSIS — I10 Essential (primary) hypertension: Secondary | ICD-10-CM | POA: Diagnosis not present

## 2021-01-07 DIAGNOSIS — E782 Mixed hyperlipidemia: Secondary | ICD-10-CM

## 2021-01-07 DIAGNOSIS — D508 Other iron deficiency anemias: Secondary | ICD-10-CM

## 2021-01-07 DIAGNOSIS — K5900 Constipation, unspecified: Secondary | ICD-10-CM

## 2021-01-07 DIAGNOSIS — R7303 Prediabetes: Secondary | ICD-10-CM

## 2021-01-07 NOTE — Telephone Encounter (Signed)
  Care Management   Follow Up Note   01/07/2021 Name: Kevin Weiss MRN: [id number] DOB: 02-10-1941   Referred by: [name], FNP Reason for referral : Chronic Care Management (RNCM Follow up call - 1st attempt )   An unsuccessful telephone outreach was attempted today. The patient was referred to the case management team for assistance with care management and care coordination.   Follow Up Plan: A HIPPA compliant phone message was left for the patient providing contact information and requesting a return call.   Barb Merino, RN, BSN, CCM Care Management Coordinator [facility]  Direct Phone: [phone]

## 2021-01-14 NOTE — Patient Instructions (Signed)
Goals Addressed    . Chronic Constipation - complications minimized or prevented   On track    Timeframe:  Long-Range Goal Priority:  High Start Date:  01/07/21                            Expected End Date: 01/05/22      Next Scheduled Follow up call: 04/16/21  Self Care Activities:  . Continue to keep all scheduled follow up appointments . Take medications as directed  . Let your healthcare team know if you are unable to take your medications . Call your pharmacy for refills at least 7 days prior to running out of medication  . Work with the embedded Pharm D for cost assistance for [place]  . Increase your water intake unless otherwise directed Patient Goals: - to be able to afford Linzess for long term use                      . Follow My Treatment Plan-Chronic Kidney   On track    Timeframe:  Long-Range Goal Priority:  Medium Start Date:  01/07/21                           Expected End Date: 01/05/22                      Follow Up Date: 04/16/21   Self Care Activities:  . Continue to adhere to MD recommendations for CKD  . Continue to keep all scheduled follow up appointments . Take medications as directed  . Let your healthcare team know if you are unable to take your medications . Call your pharmacy for refills at least 7 days prior to running out of medication . Increase your water intake unless otherwise directed . Review mailed printed educational materials related to kidney disease Patient Goals: - to maintain and or improve CKD   Why is this important?    Staying as healthy as you can is very important. This may mean making changes if you smoke, don't exercise or eat poorly.   A healthy lifestyle is an important goal for you.   Following the treatment plan and making changes may be hard.   Try some of these steps to help keep the disease from getting worse.     Notes:     Marland Kitchen Mixed Hyperlipidemia - Disease progression minimized or avoided   On track    Timeframe:   Long-Range Goal Priority:  High Start Date: 10/15/20                            Expected End Date: 10/15/21  Next Follow Up Date: 04/16/21  Patient Self-Care Activities:  - Take medications as directed - Adhere to dietary and exercise recommendations - Reviewed and discussed mailed educational materials related to lowering Cholesterol levels       Patient Goal:      - to raise HDL to acceptable level

## 2021-01-14 NOTE — Chronic Care Management (AMB) (Signed)
Chronic Care Management   CCM RN Visit Note  01/07/2021 Name: Kevin Weiss MRN: [id number] DOB: [birth year]  Subjective: Kevin Weiss is a 80 y.o. year old male who is a primary care patient of [name], [place]. The care management team was consulted for assistance with disease management and care coordination needs.    Engaged with patient by telephone for follow up visit in response to provider referral for case management and/or care coordination services.   Consent to Services:  The patient was given information about Chronic Care Management services, agreed to services, and gave verbal consent prior to initiation of services.  Please see initial visit note for detailed documentation.   Patient agreed to services and verbal consent obtained.   Assessment: Review of patient past medical history, allergies, medications, health status, including review of consultants reports, laboratory and other test data, was performed as part of comprehensive evaluation and provision of chronic care management services.   SDOH (Social Determinants of Health) assessments and interventions performed: Yes, no acute challenges identified   CCM Care Plan  No Known Allergies  Outpatient Encounter Medications as of 01/07/2021  Medication Sig Note  . aspirin 81 MG tablet Take 81 mg by mouth daily.   . Cholecalciferol (VITAMIN D-3) 1000 UNITS CAPS Take 1 capsule by mouth daily.   . ferrous sulfate ([facility] IRON) 325 (65 FE) MG tablet TAKE 1 TABLET (325 MG TOTAL) BY MOUTH 2 (TWICE) TIMES DAILY WITH MEALS.   . hydrochlorothiazide (MICROZIDE) 12.5 MG capsule TAKE 1 CAPSULE BY MOUTH EVERY DAY   . linaclotide (LINZESS) 145 MCG CAPS capsule Take 145 mcg by mouth daily before breakfast. 08/22/2019: samples  . lisinopril (ZESTRIL) 10 MG tablet TAKE 1 TABLET BY MOUTH EVERY DAY   . rosuvastatin (CRESTOR) 10 MG tablet TAKE 1 TABLET BY MOUTH EVERY DAY   . Vibegron (GEMTESA) 75 MG TABS Take by mouth.   . Zoster  Vaccine Adjuvanted [facility]) injection Inject 0.5 mLs into the muscle once.    No facility-administered encounter medications on file as of 01/07/2021.    Patient Active Problem List   Diagnosis Date Noted  . Prediabetes 02/02/2019  . Primary osteoarthritis of left hip 04/11/2018  . Preop cardiovascular exam 03/30/2018  . Abnormal echocardiogram 03/30/2018  . Obese abdomen 07/22/2016  . Retrognathia 11/03/2015  . Morbid obesity ([place]) 11/03/2015  . Cardiac hypertrophy 11/03/2015  . OSA on CPAP 11/03/2015  . Degenerative joint disease (DJD) of hip 03/12/2014  . OBESITY, NOS 10/13/2006  . HYPERTENSION, BENIGN SYSTEMIC 10/13/2006  . CHF, EJECTION FRACTION > OR = 50% 10/13/2006  . CARDIOMEGALY 10/13/2006  . HEMORRHOIDS, NOS 10/13/2006  . GASTROESOPHAGEAL REFLUX, NO ESOPHAGITIS 10/13/2006  . 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 APNEA, SLEEP 10/13/2006    Conditions to be addressed/monitored:Prediabetes, HTN, Mixed Hyperlipidemia, Constipation, unspecified, Iron deficiency Anemia   Care Plan : Mixed Hyperlipidemia  Updates made by Lynne Logan, RN since 01/07/2021 12:00 AM    Problem: Mixed Hyperlipidemia   Priority: Medium    Long-Range Goal: Mixed Hyperlipidemia - Disease progression minimized or avoided   Start Date: 10/15/2020  Expected End Date: 10/15/2021  Recent Progress: On track  Priority: Medium  Note:   Current Barriers:   Ineffective Self Health Maintenance  Currently UNABLE TO independently self manage needs related to chronic health conditions.   Knowledge Deficits related to short term plan for care coordination needs and long term plans for chronic disease management needs Clinical Goal(s):  Marland Kitchen Collaboration with  [name], FNP regarding development and update of comprehensive plan of care as evidenced by provider attestation and co-signature . Inter-disciplinary care team collaboration (see longitudinal plan of care)  Patient will work with care management  team to address care coordination and chronic disease management needs related to Disease Management  Educational Needs  Care Coordination  Medication Management and Education  Psychosocial Support   Interventions:  01/07/21 completed successful outbound call with patient   Evaluation of current treatment plan related to Mixed Hyperlipidemia self-management and patient's adherence to plan as established by provider.  Collaboration with [name], FNP regarding development and update of comprehensive plan of care as evidenced by provider attestation       and co-signature  Inter-disciplinary care team collaboration (see longitudinal plan of care)  Reviewed and discussed current lipid panel results; Educated on target ranges  Educated patient on dietary and exercise recommendations  Reviewed medications with patient, determined patient is adhering to taking Rosuvastatin 10 mg q hs without noted SE   Discussed plans with patient for ongoing care management follow up and provided patient with direct contact information for care management team Self Care Activities:  . Take medications as directed . Adhere to dietary and exercise recommendations . Reviewed and discussed mailed educational materials related to lowering Cholesterol levels  Patient Goals: - to raise HDL to acceptable level   Follow Up Plan: Telephone follow up appointment with care management team member scheduled for: 04/16/21    Care Plan : Chronic Kidney (Adult)  Updates made by Lynne Logan, RN since 01/14/2021 12:00 AM    Problem: Disease Progression   Priority: Medium    Long-Range Goal: Disease Progression Prevented or Minimized   Start Date: 01/07/2021  Expected End Date: 01/07/2022  This Visit's Progress: On track  Priority: Medium  Note:   Current Barriers:   Ineffective Self Health Maintenance   Financial Restraints  Clinical Goal(s):  Marland Kitchen Collaboration with [name], FNP regarding  development and update of comprehensive plan of care as evidenced by provider attestation and co-signature . Inter-disciplinary care team collaboration (see longitudinal plan of care)  patient will work with care management team to address care coordination and chronic disease management needs related to Disease Management  Educational Needs  Care Coordination  Medication Management and Education  Psychosocial Support   Interventions:  01/07/21 completed successful outbound call with patient   Evaluation of current treatment plan related to CKD Stage III , self-management and patient's adherence to plan as established by provider.  Collaboration with [name], FNP regarding development and update of comprehensive plan of care as evidenced by provider attestation       and co-signature  Inter-disciplinary care team collaboration (see longitudinal plan of care) . Provided education to patient about basic disease process related to CKD . Review of patient status, including review of consultant's reports, relevant laboratory and other test results, and medications completed. . Reviewed medications with patient and discussed importance of medication adherence . Educated patient on the importance of increasing water intake to 64 oz per day unless otherwise directed and keeping other comorbidities well controlled including A1c and blood pressure . Mailed printed educational materials related to Chronic Kidney disease  Discussed plans with patient for ongoing care management follow up and provided patient with direct contact information for care management team Self Care Activities:  . Continue to adhere to MD recommendations for CKD  . Continue to keep all scheduled follow up appointments . Take  medications as directed  . Let your healthcare team know if you are unable to take your medications . Call your pharmacy for refills at least 7 days prior to running out of  medication . Increase your water intake unless otherwise directed . Review mailed printed educational materials related to kidney disease Patient Goals: - to maintain and or improve CKD   Follow Up Plan: Telephone follow up appointment with care management team member scheduled for: 04/16/21    Care Plan : Chronic constipation  Updates made by Lynne Logan, RN since 01/14/2021 12:00 AM    Problem: Chronic Constipation   Priority: High    Long-Range Goal: Chronic Constipation - complications minimized or prevented   Start Date: 01/07/2021  Expected End Date: 01/07/2022  This Visit's Progress: On track  Priority: High  Note:   Current Barriers:   Ineffective Self Health Maintenance  Clinical Goal(s):  Marland Kitchen Collaboration with [name], FNP regarding development and update of comprehensive plan of care as evidenced by provider attestation and co-signature . Inter-disciplinary care team collaboration (see longitudinal plan of care)  patient will work with care management team to address care coordination and chronic disease management needs related to Disease Management  Educational Needs  Care Coordination  Medication Management and Education  Psychosocial Support   Interventions:  01/07/21 completed successful call with patient   Evaluation of current treatment plan related to  Chronic Constipation , self-management and patient's adherence to plan as established by provider.  Collaboration with [name], FNP regarding development and update of comprehensive plan of care as evidenced by provider attestation       and co-signature  Inter-disciplinary care team collaboration (see longitudinal plan of care) . Provided education to patient about basic disease process related to chronic constipation  . Review of patient status, including review of consultant's reports, relevant laboratory and other test results, and medications completed. . Reviewed medications with patient  and discussed importance of medication adherence . Determined patient continues to have financial difficulty paying for Linzess and has reapplied for PAP  . Determined patient has been relying on samples from the PCP office and would like to continue taking Linzess due to getting good effectiveness for his chronic constipation while taking Linzess  . Collaborated with embedded Pharm D regarding PAP application status which is noted to be in progress  . Educated patient on dietary recommendations and importance of increasing water intake to 64 oz daily unless otherwise directed   Discussed plans with patient for ongoing care management follow up and provided patient with direct contact information for care management team Self Care Activities:  . Continue to keep all scheduled follow up appointments . Take medications as directed  . Let your healthcare team know if you are unable to take your medications . Call your pharmacy for refills at least 7 days prior to running out of medication  . Work with the embedded Pharm D for cost assistance for [place]  . Increase your water intake unless otherwise directed Patient Goals: - to be able to afford Linzess for long term use   Follow Up Plan: Telephone follow up appointment with care management team member scheduled for: 04/16/21     Plan:Telephone follow up appointment with care management team member scheduled for:  04/16/21  Barb Merino, RN, BSN, CCM Care Management Coordinator [place] [facility]  Direct Phone: [phone]

## 2021-01-22 ENCOUNTER — Telehealth: Payer: Medicare HMO

## 2021-01-27 ENCOUNTER — Telehealth: Payer: Medicare HMO

## 2021-01-27 ENCOUNTER — Ambulatory Visit (INDEPENDENT_AMBULATORY_CARE_PROVIDER_SITE_OTHER): Payer: Medicare HMO

## 2021-01-27 ENCOUNTER — Other Ambulatory Visit: Payer: Self-pay | Admitting: Nurse Practitioner

## 2021-01-27 DIAGNOSIS — I10 Essential (primary) hypertension: Secondary | ICD-10-CM

## 2021-01-27 DIAGNOSIS — D508 Other iron deficiency anemias: Secondary | ICD-10-CM

## 2021-01-27 DIAGNOSIS — E782 Mixed hyperlipidemia: Secondary | ICD-10-CM

## 2021-01-27 DIAGNOSIS — K5900 Constipation, unspecified: Secondary | ICD-10-CM

## 2021-01-27 DIAGNOSIS — R7303 Prediabetes: Secondary | ICD-10-CM

## 2021-01-27 MED ORDER — LINACLOTIDE 145 MCG PO CAPS: 145.0000 ug | ORAL_CAPSULE | Freq: Every day | ORAL | 2 refills | Status: DC

## 2021-01-27 NOTE — Chronic Care Management (AMB) (Signed)
Chronic Care Management   CCM RN Visit Note  01/27/2021 Name: Kevin Weiss MRN: [id number] DOB: November 04, 1940  Subjective: Kevin Weiss is a 80 y.o. year old male who is a primary care patient of [name], [place]. The care management team was consulted for assistance with disease management and care coordination needs.    Engaged with patient by telephone for follow up visit in response to provider referral for case management and/or care coordination services.   Consent to Services:  The patient was given information about Chronic Care Management services, agreed to services, and gave verbal consent prior to initiation of services.  Please see initial visit note for detailed documentation.   Patient agreed to services and verbal consent obtained.   Assessment: Review of patient past medical history, allergies, medications, health status, including review of consultants reports, laboratory and other test data, was performed as part of comprehensive evaluation and provision of chronic care management services.   SDOH (Social Determinants of Health) assessments and interventions performed:    CCM Care Plan  No Known Allergies  Outpatient Encounter Medications as of 01/27/2021  Medication Sig Note   aspirin 81 MG tablet Take 81 mg by mouth daily.    Cholecalciferol (VITAMIN D-3) 1000 UNITS CAPS Take 1 capsule by mouth daily.    ferrous sulfate ([facility] IRON) 325 (65 FE) MG tablet TAKE 1 TABLET (325 MG TOTAL) BY MOUTH 2 (TWICE) TIMES DAILY WITH MEALS.    hydrochlorothiazide (MICROZIDE) 12.5 MG capsule TAKE 1 CAPSULE BY MOUTH EVERY DAY    linaclotide (LINZESS) 145 MCG CAPS capsule Take 1 capsule (145 mcg total) by mouth daily before breakfast.    lisinopril (ZESTRIL) 10 MG tablet TAKE 1 TABLET BY MOUTH EVERY DAY    rosuvastatin (CRESTOR) 10 MG tablet TAKE 1 TABLET BY MOUTH EVERY DAY    Vibegron (GEMTESA) 75 MG TABS Take by mouth.    Zoster Vaccine Adjuvanted [facility]) injection  Inject 0.5 mLs into the muscle once.    [DISCONTINUED] linaclotide (LINZESS) 145 MCG CAPS capsule Take 145 mcg by mouth daily before breakfast. 08/22/2019: samples   No facility-administered encounter medications on file as of 01/27/2021.    Patient Active Problem List   Diagnosis Date Noted   Prediabetes 02/02/2019   Primary osteoarthritis of left hip 04/11/2018   Preop cardiovascular exam 03/30/2018   Abnormal echocardiogram 03/30/2018   Obese abdomen 07/22/2016   Retrognathia 11/03/2015   Morbid obesity ([place]) 11/03/2015   Cardiac hypertrophy 11/03/2015   OSA on CPAP 11/03/2015   Degenerative joint disease (DJD) of hip 03/12/2014   OBESITY, NOS 10/13/2006   HYPERTENSION, BENIGN SYSTEMIC 10/13/2006   CHF, EJECTION FRACTION > OR = 50% 10/13/2006   CARDIOMEGALY 10/13/2006   HEMORRHOIDS, NOS 10/13/2006   GASTROESOPHAGEAL REFLUX, NO ESOPHAGITIS 10/13/2006   IMPOTENCE, 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   APNEA, SLEEP 10/13/2006    Conditions to be addressed/monitored: Prediabetes, HTN, Mixed Hyperlipidemia, Constipation, unspecified, Iron deficiency Anemia   Care Plan : Chronic constipation  Updates made by Lynne Logan, RN since 01/27/2021 12:00 AM     Problem: Chronic Constipation   Priority: High     Long-Range Goal: Chronic Constipation - complications minimized or prevented   Start Date: 01/07/2021  Expected End Date: 01/07/2022  Recent Progress: On track  Priority: High  Note:   Current Barriers:  Ineffective Self Health Maintenance  Clinical Goal(s):  Collaboration with [name], FNP regarding development and update of comprehensive plan of care as evidenced by provider attestation  and co-signature Inter-disciplinary care team collaboration (see longitudinal plan of care) patient will work with care management team to address care coordination and chronic disease management needs related to Disease Management Educational Needs Care Coordination Medication Management and  Education Psychosocial Support   Interventions:  01/27/21 completed successful outbound call with patient  Evaluation of current treatment plan related to  Chronic Constipation , self-management and patient's adherence to plan as established by provider. Collaboration with [name], FNP regarding development and update of comprehensive plan of care as evidenced by provider attestation       and co-signature Inter-disciplinary care team collaboration (see longitudinal plan of care) Collaborated with embedded Pharm D regarding PAP application status, per Rexanne Mano D the application is in progress and Mr. Rondeau should pick up a Rx from his pharmacy until the PAP is approved/denied Advised patient to pick up Rx for Linzess from his preferred pharmacist, notified patient per Pharm D, his PAP for Linzess is in progress Determined patient does not have an Rx for [place] on file with his pharmacy, sent secure message to PCP provider and Rexanne Mano D requesting an Rx for Linzess be sent to his pharmacy to be filled  Instructed patient to allow time for the Rx to be sent/filled and follow up with his pharmacy, he verbalizes understanding Discussed plans with patient for ongoing care management follow up and provided patient with direct contact information for care management team Self Care Activities:  Continue to keep all scheduled follow up appointments Take medications as directed  Let your healthcare team know if you are unable to take your medications Call your pharmacy for refills at least 7 days prior to running out of medication  Work with the embedded Pharm D for cost assistance for Linzess  Increase your water intake unless otherwise directed Patient Goals: - to be able to afford Linzess for long term use   Follow Up Plan: Telephone follow up appointment with care management team member scheduled for: 04/16/21     Plan:Telephone follow up appointment with care management team  member scheduled for:  04/16/21  Barb Merino, RN, BSN, CCM Care Management Coordinator [place] [facility]  Direct Phone: [phone]

## 2021-01-27 NOTE — Patient Instructions (Addendum)
Goals Addressed      Chronic Constipation - complications minimized or prevented   On track    Timeframe:  Long-Range Goal Priority:  High Start Date:  01/07/21                            Expected End Date: 01/05/22      Next Scheduled Follow up call: 04/16/21  Self Care Activities:  Continue to keep all scheduled follow up appointments Take medications as directed  Let your healthcare team know if you are unable to take your medications Call your pharmacy for refills at least 7 days prior to running out of medication  Work with the embedded Pharm D for cost assistance for Linzess  Increase your water intake unless otherwise directed Patient Goals: - to be able to afford Linzess for long term use

## 2021-01-28 ENCOUNTER — Other Ambulatory Visit: Payer: Self-pay | Admitting: Nurse Practitioner

## 2021-01-28 DIAGNOSIS — I10 Essential (primary) hypertension: Secondary | ICD-10-CM

## 2021-02-04 ENCOUNTER — Other Ambulatory Visit: Payer: Self-pay | Admitting: Nurse Practitioner

## 2021-02-09 ENCOUNTER — Telehealth: Payer: Self-pay

## 2021-02-09 NOTE — Chronic Care Management (AMB) (Signed)
   No answer, left message of telephone  appointment with Orlando Penner CPP on 02-10-2021 at 8:30.Left message to have all medications, supplements, blood pressure and/or blood sugar logs available during appointment and to return call if need to reschedule.   [place] Pharmacist Assistant [phone]

## 2021-02-10 ENCOUNTER — Encounter: Payer: Self-pay | Admitting: Nurse Practitioner

## 2021-02-10 ENCOUNTER — Ambulatory Visit: Payer: Medicare HMO

## 2021-02-10 DIAGNOSIS — I10 Essential (primary) hypertension: Secondary | ICD-10-CM

## 2021-02-10 DIAGNOSIS — D508 Other iron deficiency anemias: Secondary | ICD-10-CM

## 2021-02-10 DIAGNOSIS — E782 Mixed hyperlipidemia: Secondary | ICD-10-CM

## 2021-02-10 DIAGNOSIS — K5900 Constipation, unspecified: Secondary | ICD-10-CM

## 2021-02-10 NOTE — Patient Instructions (Signed)
Visit Information It was great speaking with you today!  Please let me know if you have any questions about our visit.   Goals Addressed             This Visit's Progress    Manage My Medicine       Timeframe:  Long-Range Goal Priority:  High Start Date:                             Expected End Date:                       Follow Up Date 12/27 /2022    - call for medicine refill 2 or 3 days before it runs out - call if I am sick and can't take my medicine - keep a list of all the medicines I take; vitamins and herbals too - use a pillbox to sort medicine - use an alarm clock or phone to remind me to take my medicine    Why is this important?   These steps will help you keep on track with your medicines.   Notes:         Patient Care Plan: CCM Pharmacy Care Plan     Problem Identified: HTN, HLD, Constipation, Overactive Bladder      Long-Range Goal: Disease Management   This Visit's Progress: On track  Note:    Current Barriers:  Does not maintain contact with provider office Does not contact provider office for questions/concerns  Pharmacist Clinical Goal(s):  Patient will achieve adherence to monitoring guidelines and medication adherence to achieve therapeutic efficacy through collaboration with PharmD and provider.   Interventions: 1:1 collaboration with [name], FNP regarding development and update of comprehensive plan of care as evidenced by provider attestation and co-signature Inter-disciplinary care team collaboration (see longitudinal plan of care) Comprehensive medication review performed; medication list updated in electronic medical record  Hypertension (BP goal <130/80) -Controlled -Current treatment: Lisinopril 10 mg tablet once daily  Hydrochlorothiazide 12.5 mg capsule once per day -Medications previously tried: Amlodipine 10 mg  -Current home readings: 132/67 -Current dietary habits: Eating low sodium  -Current exercise habits:  please see hyperlipidemia  -Denies hypotensive/hypertensive symptoms -Educated on Daily salt intake goal < 2300 mg; Importance of home blood pressure monitoring; Proper BP monitoring technique; -Counseled to monitor BP at home four times per week, document, and provide log at future appointments -Recommended to continue current medication -Collaborate with PCP team to mail log forms for BP     Hyperlipidemia: (LDL goal < 100) -Controlled -Current treatment: Rosuvastatin 10 mg take 1 tablet by mouth daily Taking Monday, Wednesday and Friday  Aspirin 81 mg tablet once per day  -Current dietary patterns: increasing vegetables -Current exercise habits: We discussed diet and lifestyle modifications his goal is to work out starting with 20 minutes per day and increase to 30 minutes per day  -Educated on Importance of limiting foods high in cholesterol; Exercise goal of 150 minutes per week; -Recommended to continue current medication  Constipation (Goal: Increase regularity) -Controlled -Current treatment  Linzess 145 MCG - taking 1 capsule by mouth as needed for constipation  -We discussed diet and lifestyle modifications his goal is to work out starting with 20 minutes per day and increase to 30 minutes per day on the treadmill -Increasing vegetables and water -Counseled on diet and exercise extensively -Discussed in detail the patient assistance process and  pending decision Recommended to continue current medication  Overactive Bladder  -Controlled -Current treatment  Gemtesa 75 mg tablet once per day  -Medications previously tried: Oxybutynin 15 mg  -Patient reports that the medication is working well -Recommended to continue current medication  Patient Goals/Self-Care Activities Patient will:  - take medications as prescribed - Patient reports that he was told to stop having colonoscopies after the age of 73.    Follow Up Plan: The patient has been provided with contact  information for the care management team and has been advised to call with any health related questions or concerns.         Patient agreed to services and verbal consent obtained.   The patient verbalized understanding of instructions, educational materials, and care plan provided today and agreed to receive a mailed copy of patient instructions, educational materials, and care plan.   Orlando Penner, PharmD Clinical Pharmacist [facility] [phone]

## 2021-02-10 NOTE — Progress Notes (Signed)
Chronic Care Management Pharmacy Note  02/10/2021 Name:  Kevin Weiss MRN:  [id number] DOB:  02/17/1941  Summary: Patient reports that he is doing well    Recommendations/Changes made from today's visit: Recommend patient receive shingles vaccine Recommend patient increase water, fiber and vegetables   Recommend patient begin to monitor BP more frequently   Plan: Patient to be vaccinated.  Patient is going to begin walking on treadmill at least 20 minutes per day.  Increase vegetables and fiber in diet.  Will begin to monitor BP at least 4 days per week and record readings in log book  Subjective: Kevin Weiss is an 80 y.o. year old male who is a primary patient of [name], [place].  The CCM team was consulted for assistance with disease management and care coordination needs.    Engaged with patient by telephone for follow up visit in response to provider referral for pharmacy case management and/or care coordination services.   Consent to Services:  The patient was given information about Chronic Care Management services, agreed to services, and gave verbal consent prior to initiation of services.  Please see initial visit note for detailed documentation.   Patient Care Team: [name], FNP as PCP - General ([place]) Rex Kras Claudette Stapler, RN as Case Manager Mayford Knife, [facility] (Pharmacist)  Recent office visits: 11/12/2020 PCP OV   Recent consult visits: 08/27/2020 [facility] [place] [facility] visits: None in previous 6 months   Objective:  Lab Results  Component Value Date   CREATININE 1.37 (H) 11/12/2020   BUN 13 11/12/2020   GFRNONAA 51 (L) 09/10/2020   GFRAA 59 (L) 09/10/2020   NA 137 11/12/2020   K 4.4 11/12/2020   CALCIUM 10.0 11/12/2020   CO2 20 11/12/2020   GLUCOSE 77 11/12/2020    Lab Results  Component Value Date/Time   HGBA1C 5.7 (H) 04/08/2020 12:59 PM   HGBA1C 5.7 (H) 10/10/2019 12:01 PM   MICROALBUR 10 04/08/2020 03:41 PM     Last diabetic Eye exam: No results found for: HMDIABEYEEXA  Last diabetic Foot exam: No results found for: HMDIABFOOTEX   Lab Results  Component Value Date   CHOL 124 11/12/2020   HDL 38 (L) 11/12/2020   LDLCALC 71 11/12/2020   TRIG 71 11/12/2020   CHOLHDL 3.3 11/12/2020    Hepatic Function Latest Ref Rng & Units 11/12/2020 09/10/2020 04/08/2020  Total Protein 6.0 - 8.5 g/dL 7.5 7.5 7.4  Albumin 3.7 - 4.7 g/dL 4.8(H) 4.7 4.7  AST 0 - 40 IU/L _0 ALT 0 - 44 IU/L 24 27 36  Alk Phosphatase 44 - 121 IU/L 191(H) 217(H) 217(H)  Total Bilirubin 0.0 - 1.2 mg/dL 0.5 0.5 0.4    Lab Results  Component Value Date/Time   TSH 1.860 05/15/2019 12:38 PM   FREET4 1.26 05/15/2019 12:38 PM    CBC Latest Ref Rng & Units 04/08/2020 10/10/2019 05/15/2019  WBC 3.4 - 10.8 x10E3/uL 6.1 5.5 4.8  Hemoglobin 13.0 - 17.7 g/dL 15.7 15.7 15.7  Hematocrit 37.5 - 51.0 % 45.4 46.0 47.2  Platelets 150 - 450 x10E3/uL 223 220 198    No results found for: VD25OH  Clinical ASCVD: No  The ASCVD Risk score Mikey Bussing DC Jr., et al., 2013) failed to calculate for the following reasons:   The valid total cholesterol range is 130 to 320 mg/dL    Depression screen [facility] 2/9 11/12/2020 03/20/2020 03/08/2019  Decreased Interest 0 0 0  Down,  Depressed, Hopeless 0 0 0  PHQ - 2 Score 0 0 0  Altered sleeping - 0 0  Tired, decreased energy - 0 0  Change in appetite - 0 0  Feeling bad or failure about yourself  - 0 0  Trouble concentrating - 0 0  Moving slowly or fidgety/restless - 0 0  Suicidal thoughts - 0 0  PHQ-9 Score - 0 0  Difficult doing work/chores - Not difficult at all -     Social History   Tobacco Use  Smoking Status Former   Packs/day: 0.25   Years: 10.00   Pack years: 2.50   Types: Cigarettes  Smokeless Tobacco Never  Tobacco Comments   40 + years   BP Readings from Last 3 Encounters:  11/12/20 118/66  11/12/20 118/66  08/27/20 (!) 162/88   Pulse Readings from Last 3 Encounters:  11/12/20  76  11/12/20 76  08/27/20 (!) 58   Wt Readings from Last 3 Encounters:  11/12/20 253 lb (114.8 kg)  11/12/20 253 lb (114.8 kg)  08/27/20 258 lb (117 kg)   BMI Readings from Last 3 Encounters:  11/12/20 39.63 kg/m  11/12/20 39.63 kg/m  08/27/20 40.41 kg/m    Assessment/Interventions: Review of patient past medical history, allergies, medications, health status, including review of consultants reports, laboratory and other test data, was performed as part of comprehensive evaluation and provision of chronic care management services.   SDOH:  (Social Determinants of Health) assessments and interventions performed: Yes  SDOH Screenings   Alcohol Screen: Not on file  Depression (PHQ2-9): Low Risk    PHQ-2 Score: 0  Financial Resource Strain: Low Risk    Difficulty of Paying Living Expenses: Not hard at all  Food Insecurity: No Food Insecurity   Worried About Charity fundraiser in the Last Year: Never true   Ran Out of Food in the Last Year: Never true  Housing: Not on file  Physical Activity: Inactive   Days of Exercise per Week: 0 days   Minutes of Exercise per Session: 0 min  Social Connections: Not on file  Stress: No Stress Concern Present   Feeling of Stress : Not at all  Tobacco Use: Medium Risk   Smoking Tobacco Use: Former   Smokeless Tobacco Use: Never  Transportation Needs: No Transportation Needs   Lack of Transportation (Medical): No   Lack of Transportation (Non-Medical): No    CCM Care Plan  No Known Allergies  Medications Reviewed Today     Reviewed by Mayford Knife, [facility] (Pharmacist) on 02/10/21 at [phone]  Med List Status: <None>   Medication Order Taking? Sig Documenting Provider Last Dose Status Informant  aspirin 81 MG tablet [id number] Yes Take 81 mg by mouth daily. [provider] Taking Active   Cholecalciferol (VITAMIN D-3) 1000 UNITS CAPS [id number] Yes Take 1 capsule by mouth daily. [provider] Taking Active Self   ferrous sulfate ([facility] IRON) 325 (65 FE) MG tablet [id number] Yes TAKE 1 TABLET (325 MG TOTAL) BY MOUTH 2 (TWICE) TIMES DAILY WITH MEALS. [name], FNP Taking Active   hydrochlorothiazide (MICROZIDE) 12.5 MG capsule [id number] Yes TAKE 1 CAPSULE BY MOUTH EVERY DAY [name], FNP Taking Active   linaclotide [facility]) 145 MCG CAPS capsule [id number] Yes Take 1 capsule (145 mcg total) by mouth daily before breakfast. [name], FNP Taking Active   lisinopril (ZESTRIL) 10 MG tablet [id number] Yes TAKE 1 TABLET BY MOUTH EVERY DAY [name], FNP Taking Active  rosuvastatin (CRESTOR) 10 MG tablet [id number] Yes TAKE 1 TABLET BY MOUTH EVERY DAY [name], FNP Taking Active   Vibegron [facility]) 75 MG TABS [id number] Yes Take by mouth. [provider] Taking Active   Zoster Vaccine Adjuvanted [facility]) injection [id number] Yes Inject 0.5 mLs into the muscle once. [provider] Taking Active             Patient Active Problem List   Diagnosis Date Noted   Prediabetes 02/02/2019   Primary osteoarthritis of left hip 04/11/2018   Preop cardiovascular exam 03/30/2018   Abnormal echocardiogram 03/30/2018   Obese abdomen 07/22/2016   Retrognathia 11/03/2015   Morbid obesity ([place]) 11/03/2015   Cardiac hypertrophy 11/03/2015   OSA on CPAP 11/03/2015   Degenerative joint disease (DJD) of hip 03/12/2014   OBESITY, NOS 10/13/2006   HYPERTENSION, BENIGN SYSTEMIC 10/13/2006   CHF, EJECTION FRACTION > OR = 50% 10/13/2006   CARDIOMEGALY 10/13/2006   HEMORRHOIDS, NOS 10/13/2006   GASTROESOPHAGEAL REFLUX, NO ESOPHAGITIS 10/13/2006   IMPOTENCE, 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   APNEA, SLEEP 10/13/2006    Immunization History  Administered Date(s) Administered   Fluad Quad(high Dose 65+) 06/27/2020   Influenza, High Dose Seasonal PF 06/14/2019   Influenza-Unspecified 03/16/2014   PFIZER Comirnaty(Gray Top)Covid-19 Tri-Sucrose Vaccine 01/21/2021   PFIZER(Purple Top)SARS-COV-2  Vaccination 08/28/2019, 09/17/2019, 05/20/2020   Pneumococcal Conjugate-13 06/24/2020   Pneumococcal Polysaccharide-23 03/14/2014, 04/13/2018   Td 08/16/1996    Conditions to be addressed/monitored:  Hypertension and Hyperlipidemia  Care Plan : [place]  Updates made by Mayford Knife, [place] since 02/10/2021 12:00 AM     Problem: HTN, HLD, Constipation, Overactive Bladder      Long-Range Goal: Disease Management   This Visit's Progress: On track  Note:    Current Barriers:  Does not maintain contact with provider office Does not contact provider office for questions/concerns  Pharmacist Clinical Goal(s):  Patient will achieve adherence to monitoring guidelines and medication adherence to achieve therapeutic efficacy through collaboration with PharmD and provider.   Interventions: 1:1 collaboration with [name], FNP regarding development and update of comprehensive plan of care as evidenced by provider attestation and co-signature Inter-disciplinary care team collaboration (see longitudinal plan of care) Comprehensive medication review performed; medication list updated in electronic medical record  Hypertension (BP goal <130/80) -Controlled -Current treatment: Lisinopril 10 mg tablet once daily  Hydrochlorothiazide 12.5 mg capsule once per day -Medications previously tried: Amlodipine 10 mg  -Current home readings: 132/67 -Current dietary habits: Eating low sodium  -Current exercise habits: please see hyperlipidemia  -Denies hypotensive/hypertensive symptoms -Educated on Daily salt intake goal < 2300 mg; Importance of home blood pressure monitoring; Proper BP monitoring technique; -Counseled to monitor BP at home four times per week, document, and provide log at future appointments -Recommended to continue current medication -Collaborate with PCP team to mail log forms for BP     Hyperlipidemia: (LDL goal < 100) -Controlled -Current  treatment: Rosuvastatin 10 mg take 1 tablet by mouth daily Taking Monday, Wednesday and Friday  Aspirin 81 mg tablet once per day  -Current dietary patterns: increasing vegetables -Current exercise habits: We discussed diet and lifestyle modifications his goal is to work out starting with 20 minutes per day and increase to 30 minutes per day  -Educated on Importance of limiting foods high in cholesterol; Exercise goal of 150 minutes per week; -Recommended to continue current medication  Constipation (Goal: Increase regularity) -Controlled -Current treatment  Linzess 145 MCG - taking 1 capsule  by mouth as needed for constipation  -We discussed diet and lifestyle modifications his goal is to work out starting with 20 minutes per day and increase to 30 minutes per day on the treadmill -Increasing vegetables and water -Counseled on diet and exercise extensively -Discussed in detail the patient assistance process and pending decision Recommended to continue current medication   Overactive Bladder  -Controlled -Current treatment  Gemtesa 75 mg tablet once per day  -Medications previously tried: Oxybutynin 15 mg  -Patient reports that the medication is working well -Recommended to continue current medication  Patient Goals/Self-Care Activities Patient will:  - take medications as prescribed - Patient reports that he was told to stop having colonoscopies after the age of 24.    Follow Up Plan: The patient has been provided with contact information for the care management team and has been advised to call with any health related questions or concerns.        Medication Assistance: Application for Linzess  medication assistance program. in process.  Anticipated assistance start date 03/2021.  See plan of care for additional detail.  Compliance/Adherence/Medication fill history: Care Gaps: Shingrix Vaccine  Star-Rating Drugs: Rosuvastatin 10 mg tablet Lisinopril 10 mg  tablet  [id number] [address] [place] [address]Phone: [phone]:: [phone] Uses pill box? Yes Pt endorses 90% compliance  We discussed: Benefits of medication synchronization, packaging and delivery as well as enhanced pharmacist oversight with [employer]. Patient decided to: Continue current medication management strategy  Care Plan and Follow Up Patient Decision:  Patient agrees to Care Plan and Follow-up.  Plan: Telephone follow up appointment with care management team member scheduled for:  08/11/2021 and The patient has been provided with contact information for the care management team and has been advised to call with any health related questions or concerns.   VOrlando Penner PharmD Clinical Pharmacist [facility] [phone]

## 2021-02-27 ENCOUNTER — Telehealth: Payer: Self-pay

## 2021-02-27 NOTE — Chronic Care Management (AMB) (Signed)
Chronic Care Management Pharmacy Assistant   Name: Kevin Weiss  MRN: [id number] DOB: 27-May-1941   Reason for Encounter: Disease State/ Hypertension  Recent office visits:  None  Recent consult visits:  None  Hospital visits:  None in previous 6 months  Medications: Outpatient Encounter Medications as of 02/27/2021  Medication Sig   aspirin 81 MG tablet Take 81 mg by mouth daily.   Cholecalciferol (VITAMIN D-3) 1000 UNITS CAPS Take 1 capsule by mouth daily.   ferrous sulfate ([facility] IRON) 325 (65 FE) MG tablet TAKE 1 TABLET (325 MG TOTAL) BY MOUTH 2 (TWICE) TIMES DAILY WITH MEALS.   hydrochlorothiazide (MICROZIDE) 12.5 MG capsule TAKE 1 CAPSULE BY MOUTH EVERY DAY   linaclotide (LINZESS) 145 MCG CAPS capsule Take 1 capsule (145 mcg total) by mouth daily before breakfast.   lisinopril (ZESTRIL) 10 MG tablet TAKE 1 TABLET BY MOUTH EVERY DAY   rosuvastatin (CRESTOR) 10 MG tablet TAKE 1 TABLET BY MOUTH EVERY DAY (Patient taking differently: TAKE 1 TABLET BY MOUTH ON Monday, Wednesday AND FRIDAYS)   Vibegron (GEMTESA) 75 MG TABS Take by mouth.   Zoster Vaccine Adjuvanted [facility]) injection Inject 0.5 mLs into the muscle once.   No facility-administered encounter medications on file as of 02/27/2021.  Reviewed chart prior to disease state call. Spoke with patient regarding BP  Recent Office Vitals: BP Readings from Last 3 Encounters:  11/12/20 118/66  11/12/20 118/66  08/27/20 (!) 162/88   Pulse Readings from Last 3 Encounters:  11/12/20 76  11/12/20 76  08/27/20 (!) 58    Wt Readings from Last 3 Encounters:  11/12/20 253 lb (114.8 kg)  11/12/20 253 lb (114.8 kg)  08/27/20 258 lb (117 kg)     Kidney Function Lab Results  Component Value Date/Time   CREATININE 1.37 (H) 11/12/2020 12:06 PM   CREATININE 1.34 (H) 09/10/2020 04:58 PM   GFRNONAA 51 (L) 09/10/2020 04:58 PM   GFRAA 59 (L) 09/10/2020 04:58 PM    BMP Latest Ref Rng & Units 11/12/2020 09/10/2020  04/08/2020  Glucose 65 - 99 mg/dL 77 82 72  BUN 8 - 27 mg/dL 13 17 11   Creatinine 0.76 - 1.27 mg/dL 1.37(H) 1.34(H) 1.14  BUN/Creat Ratio 10 - 24 9(L) 13 10  Sodium 134 - 144 mmol/L 137 132(L) 139  Potassium 3.5 - 5.2 mmol/L 4.4 4.4 4.0  Chloride 96 - 106 mmol/L 98 94(L) 99  CO2 20 - 29 mmol/L 20 23 23   Calcium 8.6 - 10.2 mg/dL 10.0 9.8 9.8    Current antihypertensive regimen:  Lisinopril 10 mg tablet daily Hydrochlorothiazide 12.5 mg daily  How often are you checking your Blood Pressure? 3-5x per week  Current home BP readings: 134/76, 130/68, 126/60, 129/66, 125/66  What recent interventions/DTPs have been made by any provider to improve Blood Pressure control since last CPP Visit:  Educated on Daily salt intake goal < 2300 mg Counseled to monitor BP at home four times per week, document, and provide log at future appointments  Any recent hospitalizations or ED visits since last visit with CPP? No  What diet changes have been made to improve Blood Pressure Control?  Patient states he is continuing on a low sodium diet.  What exercise is being done to improve your Blood Pressure Control?  Patient states he is working on exercising but hasn't started yet.  Adherence Review: Is the patient currently on ACE/ARB medication? Yes Does the patient have >5 day gap between last estimated fill  dates? No  Care Gaps: Shingrix overdue Medicare wellness completed 11-12-2020  Star Rating Drugs: Rosuvastatin 10 mg- Last filled [facility]-19-2021 [facility] Lisinopril 10 mg- Last filled [facility]-29-2021 [facility]  [place] Clinical Pharmacist Assistant [phone]

## 2021-04-08 ENCOUNTER — Other Ambulatory Visit: Payer: Self-pay | Admitting: Nurse Practitioner

## 2021-04-08 DIAGNOSIS — I10 Essential (primary) hypertension: Secondary | ICD-10-CM

## 2021-04-14 ENCOUNTER — Other Ambulatory Visit: Payer: Self-pay

## 2021-04-14 ENCOUNTER — Encounter: Payer: Self-pay | Admitting: Nurse Practitioner

## 2021-04-14 ENCOUNTER — Telehealth: Payer: Self-pay

## 2021-04-14 ENCOUNTER — Ambulatory Visit (INDEPENDENT_AMBULATORY_CARE_PROVIDER_SITE_OTHER): Payer: Medicare HMO | Admitting: Nurse Practitioner

## 2021-04-14 VITALS — BP 130/78 | HR 85 | Temp 98.2°F | Ht 67.0 in | Wt 259.0 lb

## 2021-04-14 DIAGNOSIS — K5904 Chronic idiopathic constipation: Secondary | ICD-10-CM

## 2021-04-14 DIAGNOSIS — Z9989 Dependence on other enabling machines and devices: Secondary | ICD-10-CM

## 2021-04-14 DIAGNOSIS — G4733 Obstructive sleep apnea (adult) (pediatric): Secondary | ICD-10-CM | POA: Diagnosis not present

## 2021-04-14 DIAGNOSIS — Z1159 Encounter for screening for other viral diseases: Secondary | ICD-10-CM

## 2021-04-14 DIAGNOSIS — Z0000 Encounter for general adult medical examination without abnormal findings: Secondary | ICD-10-CM | POA: Diagnosis not present

## 2021-04-14 DIAGNOSIS — I10 Essential (primary) hypertension: Secondary | ICD-10-CM

## 2021-04-14 DIAGNOSIS — R7303 Prediabetes: Secondary | ICD-10-CM

## 2021-04-14 DIAGNOSIS — Z23 Encounter for immunization: Secondary | ICD-10-CM

## 2021-04-14 DIAGNOSIS — Z79899 Other long term (current) drug therapy: Secondary | ICD-10-CM

## 2021-04-14 MED ORDER — SHINGRIX 50 MCG/0.5ML IM SUSR: 0.5000 mL | Freq: Once | INTRAMUSCULAR | 0 refills | Status: AC

## 2021-04-14 MED ORDER — TETANUS-DIPHTH-ACELL PERTUSSIS 5-2.5-18.5 LF-MCG/0.5 IM SUSP: 0.5000 mL | Freq: Once | INTRAMUSCULAR | 0 refills | Status: AC

## 2021-04-14 NOTE — Patient Instructions (Signed)
Health Maintenance, Male Adopting a healthy lifestyle and getting preventive care are important in promoting health and wellness. Ask your health care provider about: The right schedule for you to have regular tests and exams. Things you can do on your own to prevent diseases and keep yourself healthy. What should I know about diet, weight, and exercise? Eat a healthy diet  Eat a diet that includes plenty of vegetables, fruits, low-fat dairy products, and lean protein. Do not eat a lot of foods that are high in solid fats, added sugars, or sodium.  Maintain a healthy weight Body mass index (BMI) is a measurement that can be used to identify possible weight problems. It estimates body fat based on height and weight. Your health care provider can help determine your BMI and help you achieve or maintain ahealthy weight. Get regular exercise Get regular exercise. This is one of the most important things you can do for your health. Most adults should: Exercise for at least 150 minutes each week. The exercise should increase your heart rate and make you sweat (moderate-intensity exercise). Do strengthening exercises at least twice a week. This is in addition to the moderate-intensity exercise. Spend less time sitting. Even light physical activity can be beneficial. Watch cholesterol and blood lipids Have your blood tested for lipids and cholesterol at 78 years of age, then havethis test every 5 years. You may need to have your cholesterol levels checked more often if: Your lipid or cholesterol levels are high. You are older than 78 years of age. You are at high risk for heart disease. What should I know about cancer screening? Many types of cancers can be detected early and may often be prevented. Depending on your health history and family history, you may need to have cancer screening at various ages. This may include screening for: Colorectal cancer. Prostate cancer. Skin cancer. Lung  cancer. What should I know about heart disease, diabetes, and high blood pressure? Blood pressure and heart disease High blood pressure causes heart disease and increases the risk of stroke. This is more likely to develop in people who have high blood pressure readings, are of African descent, or are overweight. Talk with your health care provider about your target blood pressure readings. Have your blood pressure checked: Every 3-5 years if you are 18-39 years of age. Every year if you are 40 years old or older. If you are between the ages of 65 and 75 and are a current or former smoker, ask your health care provider if you should have a one-time screening for abdominal aortic aneurysm (AAA). Diabetes Have regular diabetes screenings. This checks your fasting blood sugar level. Have the screening done: Once every three years after age 45 if you are at a normal weight and have a low risk for diabetes. More often and at a younger age if you are overweight or have a high risk for diabetes. What should I know about preventing infection? Hepatitis B If you have a higher risk for hepatitis B, you should be screened for this virus. Talk with your health care provider to find out if you are at risk forhepatitis B infection. Hepatitis C Blood testing is recommended for: Everyone born from 1945 through 1965. Anyone with known risk factors for hepatitis C. Sexually transmitted infections (STIs) You should be screened each year for STIs, including gonorrhea and chlamydia, if: You are sexually active and are younger than 78 years of age. You are older than 78 years of age   and your health care provider tells you that you are at risk for this type of infection. Your sexual activity has changed since you were last screened, and you are at increased risk for chlamydia or gonorrhea. Ask your health care provider if you are at risk. Ask your health care provider about whether you are at high risk for HIV.  Your health care provider may recommend a prescription medicine to help prevent HIV infection. If you choose to take medicine to prevent HIV, you should first get tested for HIV. You should then be tested every 3 months for as long as you are taking the medicine. Follow these instructions at home: Lifestyle Do not use any products that contain nicotine or tobacco, such as cigarettes, e-cigarettes, and chewing tobacco. If you need help quitting, ask your health care provider. Do not use street drugs. Do not share needles. Ask your health care provider for help if you need support or information about quitting drugs. Alcohol use Do not drink alcohol if your health care provider tells you not to drink. If you drink alcohol: Limit how much you have to 0-2 drinks a day. Be aware of how much alcohol is in your drink. In the [place] one drink equals one 12 oz bottle of beer (355 mL), one 5 oz glass of wine (148 mL), or one 1 oz glass of hard liquor (44 mL). General instructions Schedule regular health, dental, and eye exams. Stay current with your vaccines. Tell your health care provider if: You often feel depressed. You have ever been abused or do not feel safe at home. Summary Adopting a healthy lifestyle and getting preventive care are important in promoting health and wellness. Follow your health care provider's instructions about healthy diet, exercising, and getting tested or screened for diseases. Follow your health care provider's instructions on monitoring your cholesterol and blood pressure. This information is not intended to replace advice given to you by your health care provider. Make sure you discuss any questions you have with your healthcare provider. Document Revised: 07/26/2018 Document Reviewed: 07/26/2018 Elsevier Patient Education  2022 [employer]  

## 2021-04-14 NOTE — Progress Notes (Signed)
I,Victoria Hamilton,acting as a Education administrator for [name], FNP.,have documented all relevant documentation on the behalf of [name], FNP,as directed by  [name], FNP while in the presence of [name], [place].  This visit occurred during the SARS-CoV-2 public health emergency.  Safety protocols were in place, including screening questions prior to the visit, additional usage of staff PPE, and extensive cleaning of exam room while observing appropriate contact time as indicated for disinfecting solutions.  Subjective:     Patient ID: Kevin Weiss , male    DOB: November 19, 1940 , 80 y.o.   MRN: [id number]   Chief Complaint  Patient presents with   Annual Exam     HPI  Here for HM.  He has an appt with Cardiology on 9/19.   He is lacking motivation to exercise regularly.     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   Qualifier: Diagnosis of  By: Eusebio Friendly     Morbid obesity ([place]) 11/03/2015   OSA on CPAP 11/03/2015   Retrognathia 11/03/2015     Family History  Problem Relation Age of Onset   Hypertension Mother    Hyperlipidemia Mother    Alzheimer's disease Mother    Heart disease Father    Hyperlipidemia Father    Hypertension Father    Hypertension Sister    Hypertension Brother    Heart attack Maternal Grandmother      Current Outpatient Medications:    aspirin 81 MG tablet, Take 81 mg by mouth daily., Disp: , Rfl:    Cholecalciferol (VITAMIN D-3) 1000 UNITS  CAPS, Take 1 capsule by mouth daily., Disp: , Rfl:    ferrous sulfate ([facility] IRON) 325 (65 FE) MG tablet, TAKE 1 TABLET (325 MG TOTAL) BY MOUTH 2 (TWICE) TIMES DAILY WITH MEALS., Disp: 90 tablet, Rfl: 1   hydrochlorothiazide (MICROZIDE) 12.5 MG capsule, TAKE 1 CAPSULE BY MOUTH EVERY DAY, Disp: 90 capsule, Rfl: 0   linaclotide (LINZESS) 145 MCG CAPS capsule, Take 1 capsule (145 mcg total) by mouth daily before breakfast., Disp: 30 capsule, Rfl: 2   lisinopril (ZESTRIL) 10 MG tablet, TAKE 1 TABLET BY MOUTH EVERY DAY, Disp: 90 tablet, Rfl: 0   rosuvastatin (CRESTOR) 10 MG tablet, TAKE 1 TABLET BY MOUTH EVERY DAY (Patient taking differently: TAKE 1 TABLET BY MOUTH ON Monday, Wednesday AND FRIDAYS), Disp: 90 tablet, Rfl: 1   Vibegron (GEMTESA) 75 MG TABS, Take by mouth., Disp: , Rfl:    No Known Allergies   Men's preventive visit. Patient Health Questionnaire (PHQ-2) is  Flowsheet Row Clinical Support from 11/12/2020 in [facility]  PHQ-2 Total Score 0      Patient is on a regular diet: he is trying to incorporate more vegetables. He is not exercising on a regular basis. Marital status: Married. Relevant history for alcohol use is:  Social History   Substance and Sexual Activity  Alcohol Use Not  Currently   Comment: rarely   Relevant history for tobacco use is:  Social History   Tobacco Use  Smoking Status Former   Packs/day: 0.25   Years: 10.00   Pack years: 2.50   Types: Cigarettes  Smokeless Tobacco Never  Tobacco Comments   40 + years  .   Review of Systems  HENT: Negative.    Eyes: Negative.   Respiratory: Negative.    Cardiovascular: Negative.   Gastrointestinal: Negative.   Endocrine: Negative.   Genitourinary: Negative.   Musculoskeletal: Negative.   Allergic/Immunologic: Negative.   Neurological: Negative.   Hematological: Negative.   Psychiatric/Behavioral: Negative.      Today's Vitals   04/14/21 1113  BP: 130/78  Pulse: 85  Temp: 98.2  F (36.8 C)  Weight: 259 lb (117.5 kg)  Height: _0  (1.702 m)   Body mass index is 40.57 kg/m.   Objective:  Physical Exam Vitals reviewed.  Constitutional:      Appearance: Normal appearance. He is obese.  HENT:     Head: Normocephalic and atraumatic.     Right Ear: Tympanic membrane, ear canal and external ear normal. There is no impacted cerumen.     Left Ear: Tympanic membrane, ear canal and external ear normal. There is no impacted cerumen.  Cardiovascular:     Rate and Rhythm: Normal rate and regular rhythm.     Pulses: Normal pulses.     Heart sounds: Normal heart sounds. No murmur heard. Pulmonary:     Effort: Pulmonary effort is normal. No respiratory distress.     Breath sounds: Normal breath sounds.  Abdominal:     General: Abdomen is flat. Bowel sounds are normal. There is no distension.     Palpations: Abdomen is soft.  Genitourinary:    Prostate: Normal.     Rectum: Guaiac result negative.  Musculoskeletal:        General: Normal range of motion.     Cervical back: Normal range of motion and neck supple.  Skin:    General: Skin is warm.     Capillary Refill: Capillary refill takes less than 2 seconds.  Neurological:     General: No focal deficit present.     Mental Status: He is alert and oriented to person, place, and time.  Psychiatric:        Mood and Affect: Mood normal.        Behavior: Behavior normal.        Thought Content: Thought content normal.        Judgment: Judgment normal.        Assessment And Plan:    1. Health maintenance examination - CMP14+EGFR - Lipid panel - CBC - Hemoglobin A1c - EKG 12-Lead  2. Essential hypertension Comments: Good control with blood pressure Continue current medications - POCT Urinalysis Dipstick (81002) - POCT UA - Microalbumin  3. Prediabetes Comments: Stable, no current medications  4. Encounter for immunization - Tdap ([place]) 5-2.5-18.5 LF-MCG/0.5 injection; Inject 0.5 mLs into the  muscle once for 1 dose.  Dispense: 0.5 mL; Refill: 0 - Zoster Vaccine Adjuvanted [facility]) injection; Inject 0.5 mLs into the muscle once for 1 dose.  Dispense: 0.5 mL; Refill: 0  5. Obstructive sleep apnea syndrome Comments: Continue follow up with Cardiology and using CPAP  6. Chronic idiopathic constipation Comments: continue with linzess, tolerating well  7. Polypharmacy - [facility] Pharmacogenomics Service[facility]  8. Encounter for hepatitis C screening test for low risk patient Will check Hepatitis C  screening due to recent recommendations to screen all adults 18 years and older - Hepatitis C antibody    Patient was given opportunity to ask questions. Patient verbalized understanding of the plan and was able to repeat key elements of the plan. All questions were answered to their satisfaction.   [name], FNP   I, [name], FNP, have reviewed all documentation for this visit. The documentation on 05/14/21 for the exam, diagnosis, procedures, and orders are all accurate and complete.  THE PATIENT IS ENCOURAGED TO PRACTICE SOCIAL DISTANCING DUE TO THE COVID-19 PANDEMIC.

## 2021-04-14 NOTE — Chronic Care Management (AMB) (Signed)
    Chronic Care Management Pharmacy Assistant   Name: KEMO FINKEL  MRN: [id number] DOB: [birth year]  Reason for Encounter:  Pharmacogenetics Study  The [facility] pharmacogenomics process and benefits of testing was discussed with the patient. After the discussion, the patient made an informed consent to testing and the sample was collected and mailed to the external lab.  Pattricia Boss, [place] Pharmacist Assistant [phone]

## 2021-04-15 LAB — CMP14+EGFR
ALT: 30 IU/L (ref 0–44)
AST: 25 IU/L (ref 0–40)
Albumin/Globulin Ratio: 2 (ref 1.2–2.2)
Albumin: 5.2 g/dL — ABNORMAL HIGH (ref 3.7–4.7)
Alkaline Phosphatase: 225 IU/L — ABNORMAL HIGH (ref 44–121)
BUN/Creatinine Ratio: 12 (ref 10–24)
BUN: 14 mg/dL (ref 8–27)
Bilirubin Total: 0.5 mg/dL (ref 0.0–1.2)
CO2: 23 mmol/L (ref 20–29)
Calcium: 10.2 mg/dL (ref 8.6–10.2)
Chloride: 100 mmol/L (ref 96–106)
Creatinine, Ser: 1.16 mg/dL (ref 0.76–1.27)
Globulin, Total: 2.6 g/dL (ref 1.5–4.5)
Glucose: 91 mg/dL (ref 65–99)
Potassium: 4.1 mmol/L (ref 3.5–5.2)
Sodium: 139 mmol/L (ref 134–144)
Total Protein: 7.8 g/dL (ref 6.0–8.5)
eGFR: 65 mL/min/{1.73_m2} (ref >59)

## 2021-04-15 LAB — CBC
Hematocrit: 47.3 % (ref 37.5–51.0)
Hemoglobin: 16.4 g/dL (ref 13.0–17.7)
MCH: 29.1 pg (ref 26.6–33.0)
MCHC: 34.7 g/dL (ref 31.5–35.7)
MCV: 84 fL (ref 79–97)
Platelets: 224 10*3/uL (ref 150–450)
RBC: 5.64 x10E6/uL (ref 4.14–5.80)
RDW: 13.5 % (ref 11.6–15.4)
WBC: 6.3 10*3/uL (ref 3.4–10.8)

## 2021-04-15 LAB — LIPID PANEL
Chol/HDL Ratio: 3.1 ratio (ref 0.0–5.0)
Cholesterol, Total: 129 mg/dL (ref 100–199)
HDL: 41 mg/dL (ref >39)
LDL Chol Calc (NIH): 72 mg/dL (ref 0–99)
Triglycerides: 84 mg/dL (ref 0–149)
VLDL Cholesterol Cal: 16 mg/dL (ref 5–40)

## 2021-04-15 LAB — HEMOGLOBIN A1C
Est. average glucose Bld gHb Est-mCnc: 120 mg/dL
Hgb A1c MFr Bld: 5.8 % — ABNORMAL HIGH (ref 4.8–5.6)

## 2021-04-15 LAB — HEPATITIS C ANTIBODY: Hep C Virus Ab: <0.1 s/co ratio (ref 0.0–0.9)

## 2021-04-16 ENCOUNTER — Telehealth: Payer: Medicare HMO

## 2021-04-16 ENCOUNTER — Ambulatory Visit (INDEPENDENT_AMBULATORY_CARE_PROVIDER_SITE_OTHER): Payer: Medicare HMO

## 2021-04-16 DIAGNOSIS — D508 Other iron deficiency anemias: Secondary | ICD-10-CM

## 2021-04-16 DIAGNOSIS — K5900 Constipation, unspecified: Secondary | ICD-10-CM

## 2021-04-16 DIAGNOSIS — E782 Mixed hyperlipidemia: Secondary | ICD-10-CM

## 2021-04-16 DIAGNOSIS — I10 Essential (primary) hypertension: Secondary | ICD-10-CM

## 2021-04-16 DIAGNOSIS — R7303 Prediabetes: Secondary | ICD-10-CM

## 2021-04-23 ENCOUNTER — Telehealth: Payer: Self-pay

## 2021-04-23 NOTE — Chronic Care Management (AMB) (Signed)
Chronic Care Management Pharmacy Assistant   Name: Kevin Weiss  MRN: [id number] DOB: [birth year]  Reason for Encounter: Disease State/ Hypertension   Recent office visits:  04-16-2021 Lynne Logan, RN (CCM)  04-14-2021 [name], [place]. Albumin= 5.2, Alkaline= 225. A1C= 5.8. EKG ordered. Tdap given  Recent consult visits:  None  Hospital visits:  None in previous 6 months  Medications: Outpatient Encounter Medications as of 04/23/2021  Medication Sig   aspirin 81 MG tablet Take 81 mg by mouth daily.   Cholecalciferol (VITAMIN D-3) 1000 UNITS CAPS Take 1 capsule by mouth daily.   ferrous sulfate ([facility] IRON) 325 (65 FE) MG tablet TAKE 1 TABLET (325 MG TOTAL) BY MOUTH 2 (TWICE) TIMES DAILY WITH MEALS.   hydrochlorothiazide (MICROZIDE) 12.5 MG capsule TAKE 1 CAPSULE BY MOUTH EVERY DAY   linaclotide (LINZESS) 145 MCG CAPS capsule Take 1 capsule (145 mcg total) by mouth daily before breakfast.   lisinopril (ZESTRIL) 10 MG tablet TAKE 1 TABLET BY MOUTH EVERY DAY   rosuvastatin (CRESTOR) 10 MG tablet TAKE 1 TABLET BY MOUTH EVERY DAY (Patient taking differently: TAKE 1 TABLET BY MOUTH ON Monday, Wednesday AND FRIDAYS)   Vibegron (GEMTESA) 75 MG TABS Take by mouth.   No facility-administered encounter medications on file as of 04/23/2021.   Reviewed chart prior to disease state call. Spoke with patient regarding BP  Recent Office Vitals: BP Readings from Last 3 Encounters:  04/14/21 130/78  11/12/20 118/66  11/12/20 118/66   Pulse Readings from Last 3 Encounters:  04/14/21 85  11/12/20 76  11/12/20 76    Wt Readings from Last 3 Encounters:  04/14/21 259 lb (117.5 kg)  11/12/20 253 lb (114.8 kg)  11/12/20 253 lb (114.8 kg)     Kidney Function Lab Results  Component Value Date/Time   CREATININE 1.16 04/14/2021 12:25 PM   CREATININE 1.37 (H) 11/12/2020 12:06 PM   GFRNONAA 51 (L) 09/10/2020 04:58 PM   GFRAA 59 (L) 09/10/2020 04:58 PM    BMP Latest Ref Rng &  Units 04/14/2021 11/12/2020 09/10/2020  Glucose 65 - 99 mg/dL 91 77 82  BUN 8 - 27 mg/dL '14 13 17  '$ Creatinine 0.76 - 1.27 mg/dL 1.16 1.37(H) 1.34(H)  BUN/Creat Ratio 10 - 24 12 9(L) 13  Sodium 134 - 144 mmol/L 139 137 132(L)  Potassium 3.5 - 5.2 mmol/L 4.1 4.4 4.4  Chloride 96 - 106 mmol/L 100 98 94(L)  CO2 20 - 29 mmol/L '23 20 23  '$ Calcium 8.6 - 10.2 mg/dL 10.2 10.0 9.8    Current antihypertensive regimen:  Lisinopril 10 mg tablet daily Hydrochlorothiazide 12.5 mg daily  How often are you checking your Blood Pressure? 3-5x per week  Current home BP readings: Patient reported being under 130/80 but doesn't have any exact reading  What recent interventions/DTPs have been made by any provider to improve Blood Pressure control since last CPP Visit:  Educated on Daily salt intake goal < 2300 mg Counseled to monitor BP at home four times per week, document, and provide log at future appointments  Any recent hospitalizations or ED visits since last visit with CPP? No  What diet changes have been made to improve Blood Pressure Control?  Patient states he is continuing on a low sodium diet, drinking plenty of water daily and eating fruits/ vegetables.  What exercise is being done to improve your Blood Pressure Control?  Patient stated he doesn't have a exercise routine yet but he does try and  stay active.  Adherence Review: Is the patient currently on ACE/ARB medication? Yes Does the patient have >5 day gap between last estimated fill dates? No   Care Gaps: Medicare wellness completed 11-12-2020  Star Rating Drugs: Rosuvastatin 10 mg- Last filled 02-19-2021 [facility] Lisinopril 10 mg- Last filled 01-29-2021 [facility]  [place] Clinical Pharmacist Assistant [phone]

## 2021-04-24 ENCOUNTER — Other Ambulatory Visit: Payer: Self-pay | Admitting: Nurse Practitioner

## 2021-04-24 DIAGNOSIS — Z79899 Other long term (current) drug therapy: Secondary | ICD-10-CM

## 2021-04-24 NOTE — Patient Instructions (Signed)
Goals Addressed      Chronic Constipation - complications minimized or prevented   On track    Timeframe:  Long-Range Goal Priority:  High Start Date:  01/07/21                            Expected End Date: 01/05/22      Next Scheduled Follow up call: 07/16/21  Self Care Activities:  Continue to keep all scheduled follow up appointments Take medications as directed  Let your healthcare team know if you are unable to take your medications Call your pharmacy for refills at least 7 days prior to running out of medication  Work with the embedded Pharm D for cost assistance for Linzess  Increase your water intake unless otherwise directed Patient Goals: - to be able to afford Linzess for long term use                       Follow My Treatment Plan-Chronic Kidney   On track    Timeframe:  Long-Range Goal Priority:  Medium Start Date:  01/07/21                           Expected End Date: 01/05/22                      Follow Up Date: 07/16/21   Self Care Activities:  Continue to adhere to MD recommendations for CKD  Continue to keep all scheduled follow up appointments Take medications as directed  Let your healthcare team know if you are unable to take your medications Call your pharmacy for refills at least 7 days prior to running out of medication Patient Goals: - increase water to 64 oz daily - follow dietary recommendations - keep diabetes and BP under good control   Why is this important?   Staying as healthy as you can is very important. This may mean making changes if you smoke, don't exercise or eat poorly.  A healthy lifestyle is an important goal for you.  Following the treatment plan and making changes may be hard.  Try some of these steps to help keep the disease from getting worse.     Notes:      Mixed Hyperlipidemia - Disease progression minimized or avoided   On track    Timeframe:  Long-Range Goal Priority:  High Start Date: 10/15/20                             Expected End Date: 10/15/21  Next Follow Up Date: 07/16/21  Patient Self-Care Activities:  - Take medications as directed - Adhere to dietary and exercise recommendations - Reviewed and discussed mailed educational materials related to lowering Cholesterol levels       Patient Goal:      - continue to follow a heart healthy diet - continue to exercise as directed

## 2021-04-24 NOTE — Chronic Care Management (AMB) (Signed)
Chronic Care Management   CCM RN Visit Note  04/16/2021 Name: Kevin Weiss MRN: [id number] DOB: [birth year]  Subjective: [name] is a 80 y.o. year old male who is a primary care patient of [name], [place]. The care management team was consulted for assistance with disease management and care coordination needs.    Engaged with patient by telephone for follow up visit in response to provider referral for case management and/or care coordination services.   Consent to Services:  The patient was given information about Chronic Care Management services, agreed to services, and gave verbal consent prior to initiation of services.  Please see initial visit note for detailed documentation.   Patient agreed to services and verbal consent obtained.   Assessment: Review of patient past medical history, allergies, medications, health status, including review of consultants reports, laboratory and other test data, was performed as part of comprehensive evaluation and provision of chronic care management services.   SDOH (Social Determinants of Health) assessments and interventions performed:  Yes, no acute challenges   CCM Care Plan  No Known Allergies  Outpatient Encounter Medications as of 04/16/2021  Medication Sig   aspirin 81 MG tablet Take 81 mg by mouth daily.   Cholecalciferol (VITAMIN D-3) 1000 UNITS CAPS Take 1 capsule by mouth daily.   ferrous sulfate ([facility] IRON) 325 (65 FE) MG tablet TAKE 1 TABLET (325 MG TOTAL) BY MOUTH 2 (TWICE) TIMES DAILY WITH MEALS.   hydrochlorothiazide (MICROZIDE) 12.5 MG capsule TAKE 1 CAPSULE BY MOUTH EVERY DAY   linaclotide (LINZESS) 145 MCG CAPS capsule Take 1 capsule (145 mcg total) by mouth daily before breakfast.   lisinopril (ZESTRIL) 10 MG tablet TAKE 1 TABLET BY MOUTH EVERY DAY   rosuvastatin (CRESTOR) 10 MG tablet TAKE 1 TABLET BY MOUTH EVERY DAY (Patient taking differently: TAKE 1 TABLET BY MOUTH ON Monday, Wednesday AND FRIDAYS)    Vibegron (GEMTESA) 75 MG TABS Take by mouth.   No facility-administered encounter medications on file as of 04/16/2021.    Patient Active Problem List   Diagnosis Date Noted   Prediabetes 02/02/2019   Primary osteoarthritis of left hip 04/11/2018   Preop cardiovascular exam 03/30/2018   Abnormal echocardiogram 03/30/2018   Obese abdomen 07/22/2016   Retrognathia 11/03/2015   Morbid obesity ([place]) 11/03/2015   Cardiac hypertrophy 11/03/2015   OSA on CPAP 11/03/2015   Degenerative joint disease (DJD) of hip 03/12/2014   OBESITY, NOS 10/13/2006   HYPERTENSION, BENIGN SYSTEMIC 10/13/2006   CHF, EJECTION FRACTION > OR = 50% 10/13/2006   CARDIOMEGALY 10/13/2006   HEMORRHOIDS, NOS 10/13/2006   GASTROESOPHAGEAL REFLUX, NO ESOPHAGITIS 10/13/2006   IMPOTENCE, 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   Sleep apnea 10/13/2006    Conditions to be addressed/monitored: Prediabetes, HTN, Mixed Hyperlipidemia, Constipation, unspecified, Iron deficiency Anemia   Care Plan : Mixed Hyperlipidemia  Updates made by Lynne Logan, RN since 04/16/2021 12:00 AM     Problem: Mixed Hyperlipidemia   Priority: Medium     Long-Range Goal: Mixed Hyperlipidemia - Disease progression minimized or avoided   Start Date: 10/15/2020  Expected End Date: 10/15/2021  Recent Progress: On track  Priority: Medium  Note:   Current Barriers:  Ineffective Self Health Maintenance Currently UNABLE TO independently self manage needs related to chronic health conditions.  Knowledge Deficits related to short term plan for care coordination needs and long term plans for chronic disease management needs Clinical Goal(s):  Collaboration with [name], FNP regarding development and update of comprehensive plan  of care as evidenced by provider attestation and co-signature Inter-disciplinary care team collaboration (see longitudinal plan of care) Patient will work with care management team to address care coordination and chronic disease  management needs related to Disease Management Educational Needs Care Coordination Medication Management and Education Psychosocial Support   Interventions:  04/16/21 completed successful outbound call with patient  Evaluation of current treatment plan related to Mixed Hyperlipidemia self-management and patient's adherence to plan as established by provider. Collaboration with [name], FNP regarding development and update of comprehensive plan of care as evidenced by provider attestation       and co-signature Inter-disciplinary care team collaboration (see longitudinal plan of care) Review of patient status, including review of consultant's reports, relevant laboratory and other test results, and medications completed. Reviewed medications with patient and discussed importance of medication adherence Educated patient on dietary and exercise recommendations Positive reinforcement given to patient for lowering his Cholesterol and improving his HDL Discussed plans with patient for ongoing care management follow up and provided patient with direct contact information for care management team Self Care Activities:  Take medications as directed Adhere to dietary and exercise recommendations Reviewed and discussed mailed educational materials related to lowering Cholesterol levels  Patient Goals: - continue to follow a heart healthy diet - continue to exercise as directed   Follow Up Plan: Telephone follow up appointment with care management team member scheduled for: 07/16/21     Care Plan : Chronic Kidney (Adult)  Updates made by Lynne Logan, RN since 04/16/2021 12:00 AM     Problem: Disease Progression   Priority: Medium     Long-Range Goal: Disease Progression Prevented or Minimized   Start Date: 01/07/2021  Expected End Date: 01/07/2022  Recent Progress: On track  Priority: Medium  Note:   Current Barriers:  Ineffective Self Health Maintenance  Financial Restraints   Clinical Goal(s):  Collaboration with [name], FNP regarding development and update of comprehensive plan of care as evidenced by provider attestation and co-signature Inter-disciplinary care team collaboration (see longitudinal plan of care) patient will work with care management team to address care coordination and chronic disease management needs related to Disease Management Educational Needs Care Coordination Medication Management and Education Psychosocial Support   Interventions:  04/16/21 completed successful outbound call with patient  Evaluation of current treatment plan related to CKD Stage III , self-management and patient's adherence to plan as established by provider. Collaboration with [name], FNP regarding development and update of comprehensive plan of care as evidenced by provider attestation       and co-signature Inter-disciplinary care team collaboration (see longitudinal plan of care) Provided education to patient about basic disease process related to Chronic Kidney disease Review of patient status, including review of consultant's reports, relevant laboratory and other test results, and medications completed. Reviewed medications with patient and discussed importance of medication adherence Educated patient on the importance of increasing water intake to 64 oz per day unless otherwise directed  Educated on the importance of keeping BP and diabetes under good control  Mailed printed educational materials related to Eating Right with Kidney disease  Discussed plans with patient for ongoing care management follow up and provided patient with direct contact information for care management team Self Care Activities:  Continue to adhere to MD recommendations for CKD  Continue to keep all scheduled follow up appointments Take medications as directed  Let your healthcare team know if you are unable to take your medications Call your  pharmacy for refills at  least 7 days prior to running out of medication Patient Goals: - increase water to 64 oz daily - follow dietary recommendations - keep diabetes and BP under good control   Follow Up Plan: Telephone follow up appointment with care management team member scheduled for: 07/16/21     Care Plan : Chronic constipation  Updates made by Lynne Logan, RN since 04/16/2021 12:00 AM     Problem: Chronic Constipation   Priority: High     Long-Range Goal: Chronic Constipation - complications minimized or prevented   Start Date: 01/07/2021  Expected End Date: 01/07/2022  Recent Progress: On track  Priority: High  Note:   Current Barriers:  Ineffective Self Health Maintenance  Clinical Goal(s):  Collaboration with [name], FNP regarding development and update of comprehensive plan of care as evidenced by provider attestation and co-signature Inter-disciplinary care team collaboration (see longitudinal plan of care) patient will work with care management team to address care coordination and chronic disease management needs related to Disease Management Educational Needs Care Coordination Medication Management and Education Psychosocial Support   Interventions:  04/16/21 completed successful outbound call with patient  Evaluation of current treatment plan related to  Chronic Constipation , self-management and patient's adherence to plan as established by provider. Collaboration with [name], FNP regarding development and update of comprehensive plan of care as evidenced by provider attestation       and co-signature Inter-disciplinary care team collaboration (see longitudinal plan of care) Determined patient continues to rely on [place] for relief of chronic constipation  Discussed patient continues to have difficulty affording the medication and has worked with the embedded Pharm D for reapplying for PAP Provided patient with the contact number for Linzess financial support offered  per the manufacturer website Educated on dietary and exercise recommendations, reinforced increasing water intake  Discussed plans with patient for ongoing care management follow up and provided patient with direct contact information for care management team Self Care Activities:  Continue to keep all scheduled follow up appointments Take medications as directed  Let your healthcare team know if you are unable to take your medications Call your pharmacy for refills at least 7 days prior to running out of medication  Work with the embedded Pharm D for cost assistance for Linzess  Increase your water intake unless otherwise directed Patient Goals: - to be able to afford Linzess for long term use   Follow Up Plan: Telephone follow up appointment with care management team member scheduled for: 07/16/21     Plan:Telephone follow up appointment with care management team member scheduled for:  07/16/21  Barb Merino, RN, BSN, CCM Care Management Coordinator [facility] [facility]  Direct Phone: [phone]

## 2021-05-04 ENCOUNTER — Encounter: Payer: Self-pay | Admitting: Cardiology

## 2021-05-04 ENCOUNTER — Other Ambulatory Visit: Payer: Self-pay

## 2021-05-04 ENCOUNTER — Ambulatory Visit (INDEPENDENT_AMBULATORY_CARE_PROVIDER_SITE_OTHER): Payer: Medicare HMO | Admitting: Cardiology

## 2021-05-04 VITALS — BP 130/72 | HR 78 | Ht 67.0 in | Wt 259.7 lb

## 2021-05-04 DIAGNOSIS — R001 Bradycardia, unspecified: Secondary | ICD-10-CM

## 2021-05-04 NOTE — Progress Notes (Signed)
Cardiology Office Note   Date:  05/04/2021   ID:  Kevin Weiss, DOB 07-28-1941, MRN [id number]  PCP:  [name], [place]  Cardiologist:   None   Chief Complaint  Patient presents with   Abnormal ECG       History of Present Illness: Kevin Weiss is a 80 y.o. male who was seen previously for evaluation of chest pain.  It has been a couple of years since I last saw him   he has had no new cardiovascular complaints since I last saw him. The patient denies any new symptoms such as chest discomfort, neck or arm discomfort. There has been no new shortness of breath, PND or orthopnea. There have been no reported palpitations, presyncope or syncope.   He is more sedentary than he used to be because of COVID.  He is put on some weight.   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   Qualifier: Diagnosis of  By: Eusebio Friendly     Morbid obesity ([place]) 11/03/2015   OSA on CPAP 11/03/2015   Retrognathia 11/03/2015    Past Surgical History:  Procedure Laterality Date   COLONOSCOPY     KNEE ARTHROSCOPY Left 15+yrs ago   TOTAL HIP ARTHROPLASTY Right 03/12/2014   Procedure: TOTAL HIP ARTHROPLASTY ANTERIOR APPROACH;  Surgeon: Hessie Dibble, MD;  Location: [facility];  Service: Orthopedics;  Laterality: Right;   TOTAL HIP ARTHROPLASTY Left 04/11/2018   Procedure: TOTAL HIP ARTHROPLASTY ANTERIOR APPROACH;  Surgeon: Melrose Nakayama, MD;  Location: [facility];  Service: Orthopedics;  Laterality:  Left;     Current Outpatient Medications  Medication Sig Dispense Refill   aspirin 81 MG tablet Take 81 mg by mouth daily.     Cholecalciferol (VITAMIN D-3) 1000 UNITS CAPS Take 1 capsule by mouth daily.     ferrous sulfate ([facility] IRON) 325 (65 FE) MG tablet TAKE 1 TABLET (325 MG TOTAL) BY MOUTH 2 (TWICE) TIMES DAILY WITH MEALS. 90 tablet 1   hydrochlorothiazide (MICROZIDE) 12.5 MG capsule TAKE 1 CAPSULE BY MOUTH EVERY DAY 90 capsule 0   linaclotide (LINZESS) 145 MCG CAPS capsule Take 1 capsule (145 mcg total) by mouth daily before breakfast. 30 capsule 2   lisinopril (ZESTRIL) 10 MG tablet TAKE 1 TABLET BY MOUTH EVERY DAY 90 tablet 0   rosuvastatin (CRESTOR) 10 MG tablet TAKE 1 TABLET BY MOUTH EVERY DAY (Patient taking differently: TAKE 1 TABLET BY MOUTH ON Monday, Wednesday AND FRIDAYS) 90 tablet 1   Vibegron (GEMTESA) 75 MG TABS Take by mouth.     No current facility-administered medications for this visit.    Allergies:   Patient has no known allergies.    ROS:  Please see the history of present illness.   Otherwise, review of systems are positive for none.   All other systems are reviewed and negative.    PHYSICAL EXAM: VS:  BP 130/72  Pulse 78   Ht '5\' 7"'$  (1.702 m)   Wt 259 lb 11 oz (117.8 kg)   SpO2 97%   BMI 40.67 kg/m  , BMI Body mass index is 40.67 kg/m. GENERAL:  Well appearing NECK:  No jugular venous distention, waveform within normal limits, carotid upstroke brisk and symmetric, no bruits, no thyromegaly LUNGS:  Clear to auscultation bilaterally CHEST:  Unremarkable HEART:  PMI not displaced or sustained,S1 and S2 within normal limits, no S3, no S4, no clicks, no rubs, no murmurs ABD:  Flat, positive bowel sounds normal in frequency in pitch, no bruits, no rebound, no guarding, no midline pulsatile mass, no hepatomegaly, no splenomegaly EXT:  2 plus pulses throughout, no edema, no cyanosis no clubbing  EKG:  EKG is  ordered today. Sinus rhythm, rate 78, right  bundle branch block, left axis deviation, left anterior fascicular block  Recent Labs: 04/14/2021: ALT 30; BUN 14; Creatinine, Ser 1.16; Hemoglobin 16.4; Platelets 224; Potassium 4.1; Sodium 139    Lipid Panel    Component Value Date/Time   CHOL 129 04/14/2021 1225   TRIG 84 04/14/2021 1225   HDL 41 04/14/2021 1225   CHOLHDL 3.1 04/14/2021 1225   LDLCALC 72 04/14/2021 1225      Wt Readings from Last 3 Encounters:  05/04/21 259 lb 11 oz (117.8 kg)  04/14/21 259 lb (117.5 kg)  11/12/20 253 lb (114.8 kg)      Other studies Reviewed: Additional studies/ records that were reviewed today include: None     ASSESSMENT AND PLAN:   ABNORMAL EKG : He has had no syncope.  We talked about this if he progression of his conduction disturbance.   HYPERTENSION:  Her blood pressure is controlled.  No change in therapy.   OBESITY:   He has gained some weight and he had a long discussion about this.  I gave him specific instructions on diet and exercise.  Current medicines are reviewed at length with the patient today.  The patient does not have concerns regarding medicines.  The following changes have been made:  None  Labs/ tests ordered today include: None  Orders Placed This Encounter  Procedures   EKG 12-Lead      Disposition:   FU with me in 12  months .    Signed, Minus Breeding, MD  05/04/2021 1:08 PM    [facility]

## 2021-05-04 NOTE — Patient Instructions (Signed)
Medication Instructions:  Your Physician recommend you continue on your current medication as directed.    *If you need a refill on your cardiac medications before your next appointment, please call your pharmacy*   Lab Work: None ordered today    Testing/Procedures: None ordered today   Follow-Up: At [facility], you and your health needs are our priority.  As part of our continuing mission to provide you with exceptional heart care, we have created designated Provider Care Teams.  These Care Teams include your primary Cardiologist (physician) and Advanced Practice Providers (APPs -  Physician Assistants and Nurse Practitioners) who all work together to provide you with the care you need, when you need it.  We recommend signing up for the patient portal called "MyChart".  Sign up information is provided on this After Visit Summary.  MyChart is used to connect with patients for Virtual Visits (Telemedicine).  Patients are able to view lab/test results, encounter notes, upcoming appointments, etc.  Non-urgent messages can be sent to your provider as well.   To learn more about what you can do with MyChart, go to [email or web address].    Your next appointment:   2 year(s)  The format for your next appointment:   In Person  Provider:   Minus Breeding, MD

## 2021-05-11 ENCOUNTER — Other Ambulatory Visit: Payer: Self-pay | Admitting: Nurse Practitioner

## 2021-05-11 ENCOUNTER — Telehealth: Payer: Self-pay

## 2021-05-11 DIAGNOSIS — R7989 Other specified abnormal findings of blood chemistry: Secondary | ICD-10-CM

## 2021-05-11 NOTE — Telephone Encounter (Signed)
A new link was sent to the pt's cell to setup mychart at his request.

## 2021-05-12 ENCOUNTER — Other Ambulatory Visit: Payer: Medicare HMO

## 2021-05-12 DIAGNOSIS — R7989 Other specified abnormal findings of blood chemistry: Secondary | ICD-10-CM

## 2021-05-12 LAB — HEPATITIS A ANTIBODY, IGM: Hep A IgM: NEGATIVE

## 2021-05-12 LAB — HEPATITIS C ANTIBODY: Hep C Virus Ab: <0.1 s/co ratio (ref 0.0–0.9)

## 2021-05-12 LAB — GAMMA GT: GGT: 30 IU/L (ref 0–65)

## 2021-05-12 LAB — HEPATITIS B SURFACE ANTIBODY,QUALITATIVE: Hep B Surface Ab, Qual: NONREACTIVE

## 2021-05-14 ENCOUNTER — Encounter: Payer: Self-pay | Admitting: Nurse Practitioner

## 2021-05-14 ENCOUNTER — Other Ambulatory Visit: Payer: Self-pay | Admitting: Nurse Practitioner

## 2021-05-14 DIAGNOSIS — R748 Abnormal levels of other serum enzymes: Secondary | ICD-10-CM

## 2021-05-15 DIAGNOSIS — I10 Essential (primary) hypertension: Secondary | ICD-10-CM

## 2021-05-15 DIAGNOSIS — D508 Other iron deficiency anemias: Secondary | ICD-10-CM

## 2021-05-15 DIAGNOSIS — E782 Mixed hyperlipidemia: Secondary | ICD-10-CM | POA: Diagnosis not present

## 2021-05-21 ENCOUNTER — Ambulatory Visit
Admission: RE | Admit: 2021-05-21 | Discharge: 2021-05-21 | Disposition: A | Discharge status: Home / Self Care | Payer: Medicare HMO | Source: Ambulatory Visit | Attending: Nurse Practitioner | Admitting: Nurse Practitioner

## 2021-05-21 DIAGNOSIS — R748 Abnormal levels of other serum enzymes: Secondary | ICD-10-CM

## 2021-05-21 IMAGING — US US ABDOMEN LIMITED
1 series · 13 of 25 positions shown · non-contrast
Comparison: None.

CLINICAL DATA: Elevated serum alkaline phosphatase

EXAM:
ULTRASOUND ABDOMEN LIMITED RIGHT UPPER QUADRANT

[Series 1: us abdomen limited · 0.28mm/px · 13 of 71 slices shown]
[im 1/71]
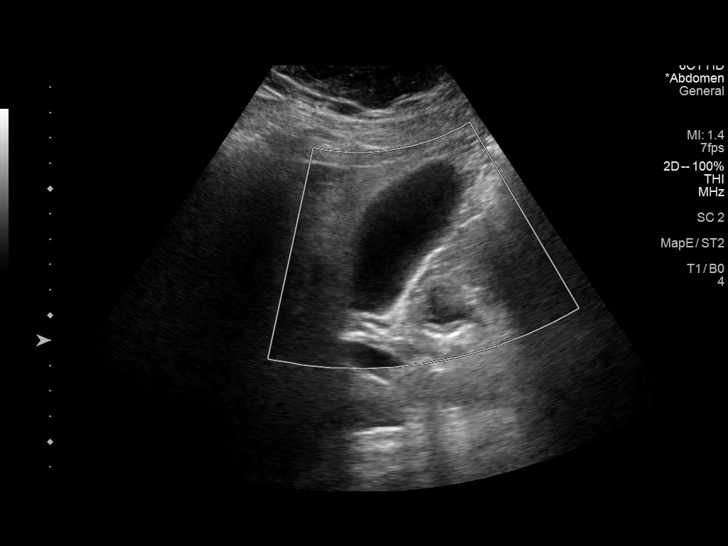
[im 6/71]
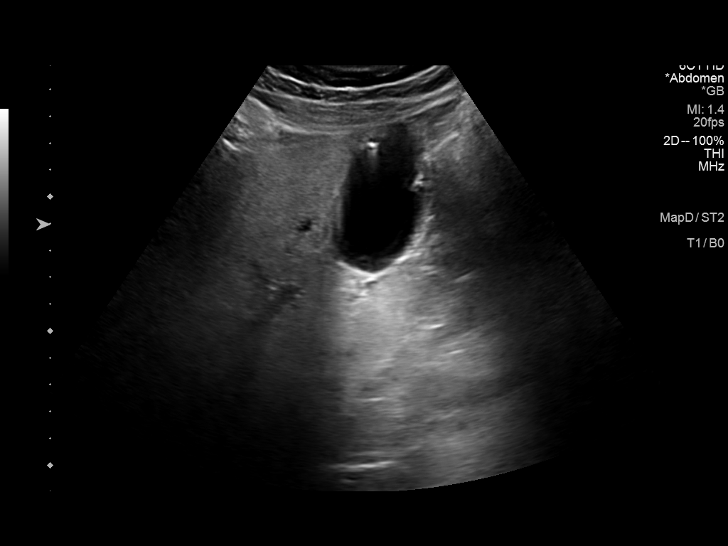
[im 12/71]
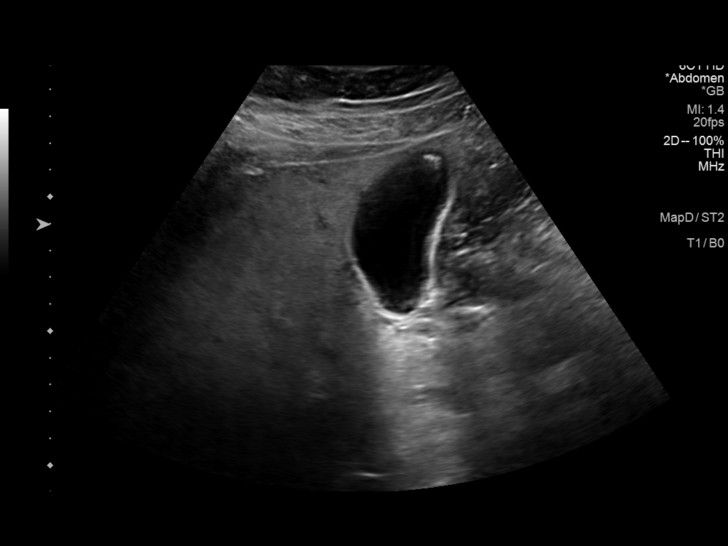
[im 18/71]
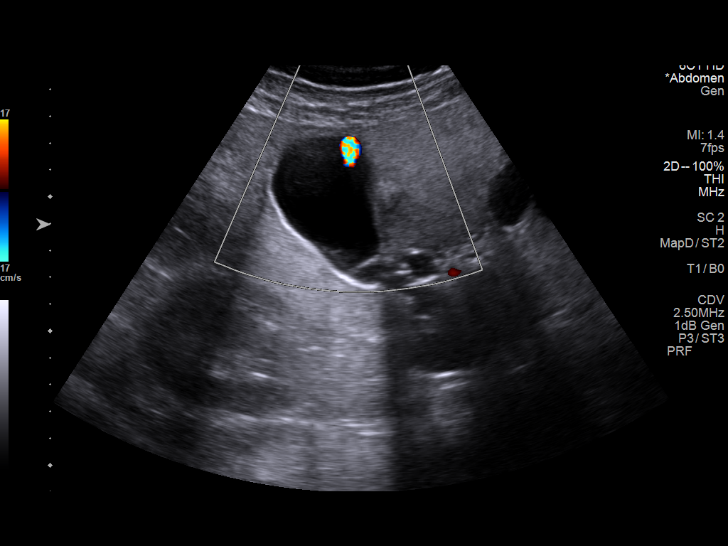
[im 24/71]
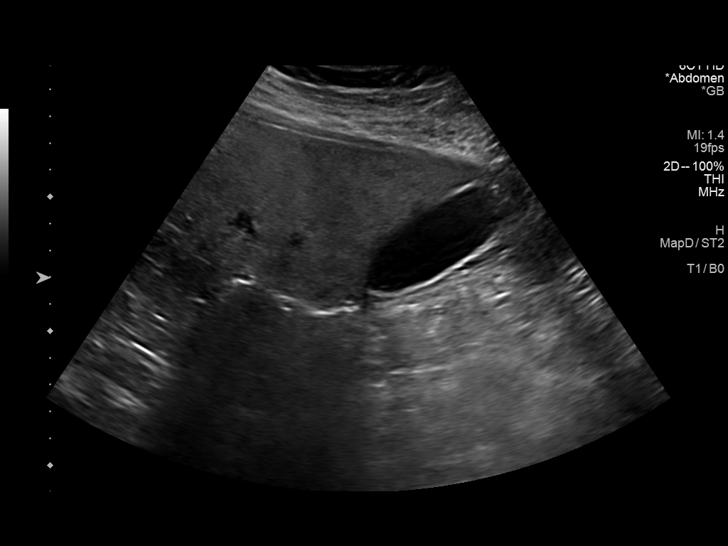
[im 30/71]
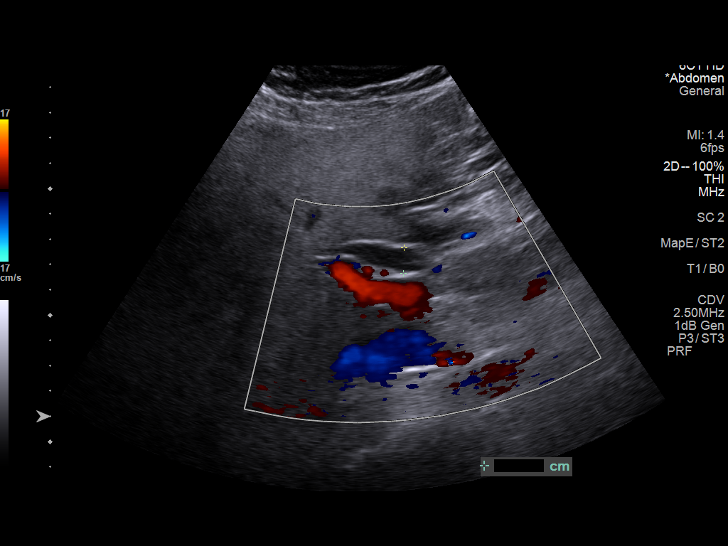
[im 36/71]
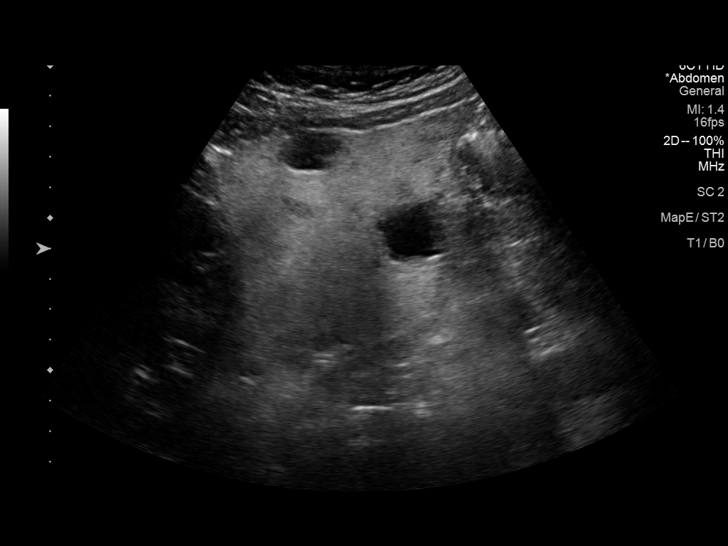
[im 41/71]
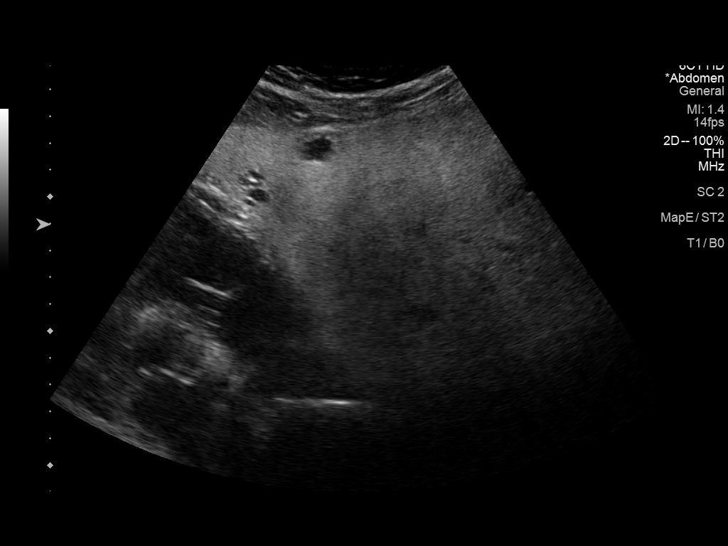
[im 47/71]
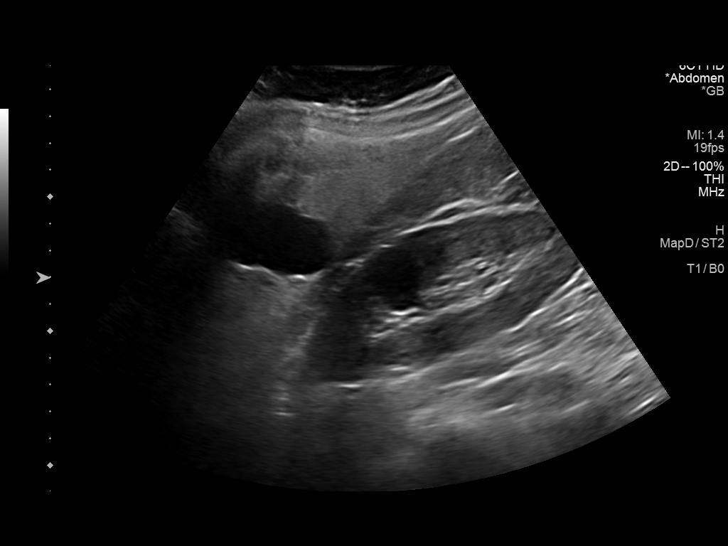
[im 53/71]
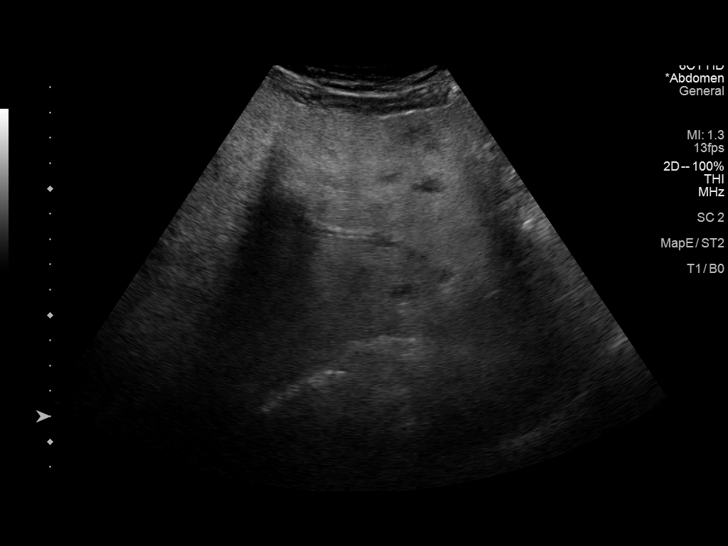
[im 59/71]
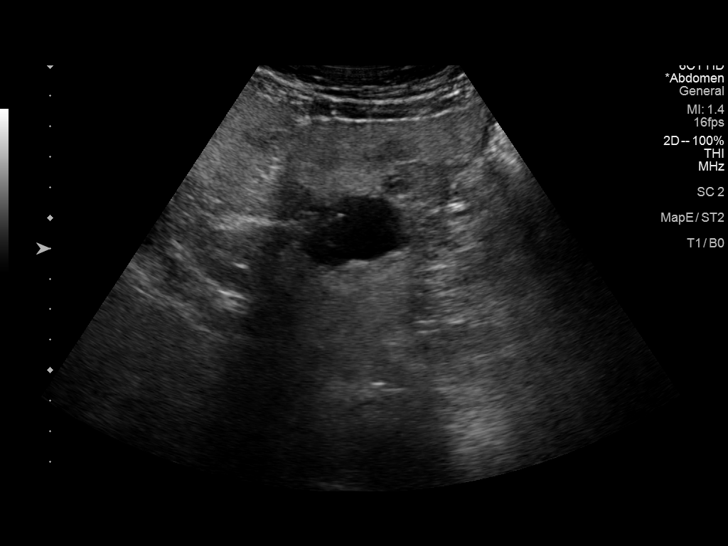
[im 65/71]
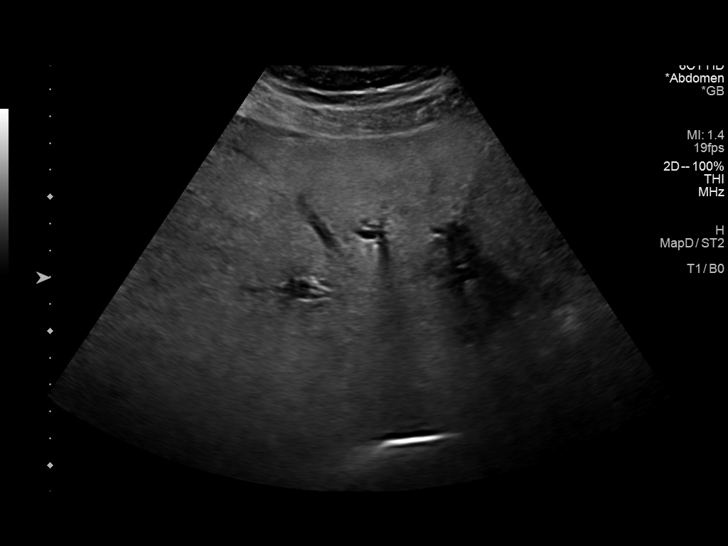
[im 71/71]
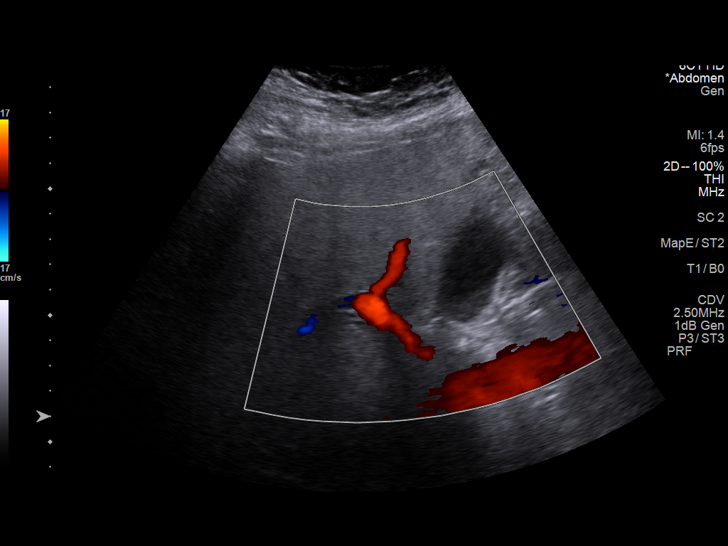

[13 of 25 positions shown; findings below may reference images not displayed]

FINDINGS: Gallbladder:

Focal echogenicity within the gallbladder wall demonstrating ring
down artifact is in keeping with gallbladder adenomyosis. The
gallbladder is not distended. No intraluminal stones or sludge is
identified. There is no gallbladder wall thickening or
pericholecystic fluid identified.

Common bile duct:

Diameter: 10 mm in proximal diameter

Liver:

The hepatic parenchymal echogenicity is diffusely increased, the
echotexture is coarsened, and there is poor acoustic through
transmission most in keeping with changes of moderate to severe
hepatic steatosis. There are numerous simple and minimally complex
cysts seen scattered throughout the liver measuring up to 4.5 cm. No
solid intrahepatic mass is identified. No intrahepatic biliary
ductal dilation. Portal vein is patent on color Doppler imaging with
normal direction of blood flow towards the liver.

Other: None.
IMPRESSION: Gallbladder adenomyosis.

Dilation of the extrahepatic bile duct. A distal obstructing lesion
is not excluded. ERCP or MRCP examination may be more helpful for
further evaluation.

Moderate to severe hepatic steatosis.

Multiple simple and minimally complex hepatic cysts. No solid
intrahepatic mass identified, though steatosis does reduce
sensitivity for detection of such lesions.

## 2021-05-25 ENCOUNTER — Telehealth: Payer: Medicare HMO

## 2021-05-25 ENCOUNTER — Ambulatory Visit (INDEPENDENT_AMBULATORY_CARE_PROVIDER_SITE_OTHER): Payer: Medicare HMO

## 2021-05-25 DIAGNOSIS — K5900 Constipation, unspecified: Secondary | ICD-10-CM

## 2021-05-25 DIAGNOSIS — R7303 Prediabetes: Secondary | ICD-10-CM

## 2021-05-25 DIAGNOSIS — R748 Abnormal levels of other serum enzymes: Secondary | ICD-10-CM

## 2021-05-25 DIAGNOSIS — I10 Essential (primary) hypertension: Secondary | ICD-10-CM

## 2021-05-25 DIAGNOSIS — E782 Mixed hyperlipidemia: Secondary | ICD-10-CM

## 2021-05-25 DIAGNOSIS — D508 Other iron deficiency anemias: Secondary | ICD-10-CM

## 2021-05-26 ENCOUNTER — Telehealth: Payer: Medicare HMO

## 2021-05-27 NOTE — Patient Instructions (Signed)
Visit Information  PATIENT GOALS:  Goals Addressed      Abnormal Abdominal US evaluated and treated   On track    Timeframe:  Short-Term Goal Priority:  High Start Date: 05/26/21                            Expected End Date:  08/26/21   Follow up date: 06/02/21  Patient Goals: - follow up with GI Specialist as directed - report any/all GI symptoms as discussed to PCP promptly for further evaluation                         The patient verbalized understanding of instructions, educational materials, and care plan provided today and declined offer to receive copy of patient instructions, educational materials, and care plan.   Telephone follow up appointment with care management team member scheduled for: 06/02/21  Barb Merino, RN, BSN, CCM Care Management Coordinator [place] [facility]  Direct Phone: [phone]

## 2021-05-27 NOTE — Chronic Care Management (AMB) (Signed)
Chronic Care Management   CCM RN Visit Note  05/26/2021 Name: Kevin Weiss MRN: [id number] DOB: Dec 28, 1940  Subjective: [name] is a 80 y.o. year old male who is a primary care patient of [name], [place]. The care management team was consulted for assistance with disease management and care coordination needs.    Engaged with patient by telephone for follow up visit in response to provider referral for case management and/or care coordination services.   Consent to Services:  The patient was given information about Chronic Care Management services, agreed to services, and gave verbal consent prior to initiation of services.  Please see initial visit note for detailed documentation.   Patient agreed to services and verbal consent obtained.   Assessment: Review of patient past medical history, allergies, medications, health status, including review of consultants reports, laboratory and other test data, was performed as part of comprehensive evaluation and provision of chronic care management services.   SDOH (Social Determinants of Health) assessments and interventions performed:    CCM Care Plan  No Known Allergies  Outpatient Encounter Medications as of 05/25/2021  Medication Sig   aspirin 81 MG tablet Take 81 mg by mouth daily.   Cholecalciferol (VITAMIN D-3) 1000 UNITS CAPS Take 1 capsule by mouth daily.   ferrous sulfate ([facility] IRON) 325 (65 FE) MG tablet TAKE 1 TABLET (325 MG TOTAL) BY MOUTH 2 (TWICE) TIMES DAILY WITH MEALS.   hydrochlorothiazide (MICROZIDE) 12.5 MG capsule TAKE 1 CAPSULE BY MOUTH EVERY DAY   linaclotide (LINZESS) 145 MCG CAPS capsule Take 1 capsule (145 mcg total) by mouth daily before breakfast.   lisinopril (ZESTRIL) 10 MG tablet TAKE 1 TABLET BY MOUTH EVERY DAY   rosuvastatin (CRESTOR) 10 MG tablet TAKE 1 TABLET BY MOUTH EVERY DAY (Patient taking differently: TAKE 1 TABLET BY MOUTH ON Monday, Wednesday AND FRIDAYS)   Vibegron (GEMTESA) 75  MG TABS Take by mouth.   No facility-administered encounter medications on file as of 05/25/2021.    Patient Active Problem List   Diagnosis Date Noted   Prediabetes 02/02/2019   Primary osteoarthritis of left hip 04/11/2018   Preop cardiovascular exam 03/30/2018   Abnormal echocardiogram 03/30/2018   Obese abdomen 07/22/2016   Retrognathia 11/03/2015   Morbid obesity ([place]) 11/03/2015   Cardiac hypertrophy 11/03/2015   OSA on CPAP 11/03/2015   Degenerative joint disease (DJD) of hip 03/12/2014   OBESITY, NOS 10/13/2006   HYPERTENSION, BENIGN SYSTEMIC 10/13/2006   CHF, EJECTION FRACTION > OR = 50% 10/13/2006   CARDIOMEGALY 10/13/2006   HEMORRHOIDS, NOS 10/13/2006   GASTROESOPHAGEAL REFLUX, NO ESOPHAGITIS 10/13/2006   IMPOTENCE, 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   Sleep apnea 10/13/2006    Conditions to be addressed/monitored: Prediabetes, HTN, Mixed Hyperlipidemia, Constipation, unspecified, Iron deficiency Anemia   Care Plan : Abnormal Abdominal Ultrasoud  Updates made by Lynne Logan, RN since 05/26/2021 12:00 AM     Problem: Abnormal Abdominal Ultrasound   Priority: High     Goal: Abnormal Abdominal US evaluated and treated   Start Date: 05/26/2021  Expected End Date: 08/26/2021  This Visit's Progress: On track  Priority: High  Note:   Current Barriers:  Ineffective Self Health Maintenance in a patient with  Prediabetes, HTN, Mixed Hyperlipidemia, Constipation, unspecified, Iron deficiency Anemia  Clinical Goal(s):  Collaboration with [name], FNP regarding development and update of comprehensive plan of care as evidenced by provider attestation and co-signature Inter-disciplinary care team collaboration (see longitudinal plan of care) patient will work with  care management team to address care coordination and chronic disease management needs related to Disease Management Educational Needs Care Coordination Medication Management and Education Medication  Reconciliation Psychosocial Support   Interventions:  05/26/21 completed successful inbound call from patient  Evaluation of current treatment plan related to  Prediabetes, HTN, Mixed Hyperlipidemia, Constipation, unspecified, Iron deficiency Anemia  ,  self-management and patient's adherence to plan as established by provider. Collaboration with [name], FNP regarding development and update of comprehensive plan of care as evidenced by provider attestation       and co-signature Inter-disciplinary care team collaboration (see longitudinal plan of care) Determined patient received my chart results from a recent Abdominal Ultrasound and has questions  Collaborated with PCP [name] FNP regarding patient results and her recommendations Determined PCP would like to refer patient to a GI specialist for further evaluation of his abnormal abdominal US Informed patient of PCP recommendations for GI follow up and answered all patient questions to his satisfaction, patient is agreeable to GI referral, informed patient of referral process and to allow up to 2 weeks for referral to be authorized Educated patient on signs/symptoms to report to PCP promptly if they occur Sent in basket message to PCP advising patient is agreeable to GI referral  Discussed plans with patient for ongoing care management follow up and provided patient with direct contact information for care management team Self Care Activities:  Self administers medications as prescribed Attends all scheduled provider appointments Calls pharmacy for medication refills Calls provider office for new concerns or questions Patient Goals: - follow up with GI Specialist as directed - report any/all GI symptoms as discussed to PCP promptly for further evaluation   Follow Up Plan: Telephone follow up appointment with care management team member scheduled for: 06/02/21      Plan:Telephone follow up appointment with care management  team member scheduled for:  06/02/21  Barb Merino, RN, BSN, CCM Care Management Coordinator [place] [facility]  Direct Phone: [phone]

## 2021-05-28 ENCOUNTER — Telehealth: Payer: Self-pay

## 2021-05-28 NOTE — Chronic Care Management (AMB) (Signed)
Chronic Care Management Pharmacy Assistant   Name: Kevin Weiss  MRN: [id number] DOB: August 06, 1941   Reason for Encounter: Disease State/ Hypertension   Recent office visits:  None  Recent consult visits:  05-04-2021 Minus Breeding, MD (Cardiology). EKG ordered. Follow up in 1 year.  Hospital visits:  None in previous 6 months  Medications: Outpatient Encounter Medications as of 05/28/2021  Medication Sig   aspirin 81 MG tablet Take 81 mg by mouth daily.   Cholecalciferol (VITAMIN D-3) 1000 UNITS CAPS Take 1 capsule by mouth daily.   ferrous sulfate ([facility] IRON) 325 (65 FE) MG tablet TAKE 1 TABLET (325 MG TOTAL) BY MOUTH 2 (TWICE) TIMES DAILY WITH MEALS.   hydrochlorothiazide (MICROZIDE) 12.5 MG capsule TAKE 1 CAPSULE BY MOUTH EVERY DAY   linaclotide (LINZESS) 145 MCG CAPS capsule Take 1 capsule (145 mcg total) by mouth daily before breakfast.   lisinopril (ZESTRIL) 10 MG tablet TAKE 1 TABLET BY MOUTH EVERY DAY   rosuvastatin (CRESTOR) 10 MG tablet TAKE 1 TABLET BY MOUTH EVERY DAY (Patient taking differently: TAKE 1 TABLET BY MOUTH ON Monday, Wednesday AND FRIDAYS)   Vibegron (GEMTESA) 75 MG TABS Take by mouth.   No facility-administered encounter medications on file as of 05/28/2021.   Reviewed chart prior to disease state call. Spoke with patient regarding BP  Recent Office Vitals: BP Readings from Last 3 Encounters:  05/04/21 130/72  04/14/21 130/78  11/12/20 118/66   Pulse Readings from Last 3 Encounters:  05/04/21 78  04/14/21 85  11/12/20 76    Wt Readings from Last 3 Encounters:  05/04/21 259 lb 11 oz (117.8 kg)  04/14/21 259 lb (117.5 kg)  11/12/20 253 lb (114.8 kg)     Kidney Function Lab Results  Component Value Date/Time   CREATININE 1.16 04/14/2021 12:25 PM   CREATININE 1.37 (H) 11/12/2020 12:06 PM   GFRNONAA 51 (L) 09/10/2020 04:58 PM   GFRAA 59 (L) 09/10/2020 04:58 PM    BMP Latest Ref Rng & Units 04/14/2021 11/12/2020 09/10/2020  Glucose  65 - 99 mg/dL 91 77 82  BUN 8 - 27 mg/dL 14 13 17   Creatinine 0.76 - 1.27 mg/dL 1.16 1.37(H) 1.34(H)  BUN/Creat Ratio 10 - 24 12 9(L) 13  Sodium 134 - 144 mmol/L 139 137 132(L)  Potassium 3.5 - 5.2 mmol/L 4.1 4.4 4.4  Chloride 96 - 106 mmol/L 100 98 94(L)  CO2 20 - 29 mmol/L 23 20 23   Calcium 8.6 - 10.2 mg/dL 10.2 10.0 9.8    Current antihypertensive regimen:  Lisinopril 10 mg tablet daily Hydrochlorothiazide 12.5 mg daily  How often are you checking your Blood Pressure? 3-5x per week  Current home BP readings: 124/66  What recent interventions/DTPs have been made by any provider to improve Blood Pressure control since last CPP Visit:  Educated on Daily salt intake goal < 2300 mg Counseled to monitor BP at home four times per week, document, and provide log at future appointments  Any recent hospitalizations or ED visits since last visit with CPP? No  What diet changes have been made to improve Blood Pressure Control?  Patient states he is eating more fish and chicken, has cut down on beef and pork and is eating plenty of vegetables. Patient states he he drinks plenty of water daily and sometimes sprite zero. Patient states he feels much better than before because he has loss some weight from eating well balanced meals.  What exercise is being done to improve  your Blood Pressure Control?  Patient stated he has been active and has started walking on treadmill. Patient states he doesn't have a routine in line but is making progress.  Adherence Review: Is the patient currently on ACE/ARB medication? Yes Does the patient have >5 day gap between last estimated fill dates? No  NOTES: Patient stated he is scheduling his covid booster and flu vaccine. Patient is very pleased with his health right now since improving his diet. He notices a major difference since he's stopped eating sweets and cutting out soda.  Care Gaps: AWV completed 11-12-2020 Flu vaccine overdue last completed  06-27-2020 Covid booster overdue last completed 01-21-2021 Last BP 130/72 05-04-2021  Star Rating Drugs: Lisinopril 10 mg- Last filled 04-07-2021 [facility] Rosuvastatin 10 mg- Last filled 02-19-2021 [facility] (Patient takes medication 3 times weekly)  [address] Clinical Pharmacist Assistant [phone]

## 2021-06-02 ENCOUNTER — Telehealth: Payer: Medicare HMO

## 2021-06-02 ENCOUNTER — Ambulatory Visit: Payer: Self-pay

## 2021-06-02 ENCOUNTER — Other Ambulatory Visit: Payer: Self-pay | Admitting: Nurse Practitioner

## 2021-06-02 DIAGNOSIS — E782 Mixed hyperlipidemia: Secondary | ICD-10-CM

## 2021-06-02 DIAGNOSIS — D508 Other iron deficiency anemias: Secondary | ICD-10-CM

## 2021-06-02 DIAGNOSIS — R748 Abnormal levels of other serum enzymes: Secondary | ICD-10-CM

## 2021-06-02 DIAGNOSIS — I10 Essential (primary) hypertension: Secondary | ICD-10-CM

## 2021-06-02 DIAGNOSIS — K5900 Constipation, unspecified: Secondary | ICD-10-CM

## 2021-06-02 DIAGNOSIS — R7989 Other specified abnormal findings of blood chemistry: Secondary | ICD-10-CM

## 2021-06-02 DIAGNOSIS — R7303 Prediabetes: Secondary | ICD-10-CM

## 2021-06-02 NOTE — Patient Instructions (Signed)
Visit Information  PATIENT GOALS:  Goals Addressed      Abnormal Abdominal US evaluated and treated   On track    Timeframe:  Short-Term Goal Priority:  High Start Date: 05/26/21                            Expected End Date:  08/26/21   Follow up date: 06/03/21  Patient Goals: - follow up with GI Specialist as directed - report any/all GI symptoms as discussed to PCP promptly for further evaluation                        The patient verbalized understanding of instructions, educational materials, and care plan provided today and declined offer to receive copy of patient instructions, educational materials, and care plan.   Telephone follow up appointment with care management team member scheduled for: 06/03/21  Barb Merino, RN, BSN, CCM Care Management Coordinator [place] [facility]  Direct Phone: [phone]

## 2021-06-02 NOTE — Chronic Care Management (AMB) (Signed)
Chronic Care Management   CCM RN Visit Note  06/02/2021 Name: Kevin Weiss MRN: [id number] DOB: 1941-02-14  Subjective: Kevin Weiss is a 80 y.o. year old male who is a primary care patient of [name], [place]. The care management team was consulted for assistance with disease management and care coordination needs.    Engaged with patient by telephone for follow up visit in response to provider referral for case management and/or care coordination services.   Consent to Services:  The patient was given information about Chronic Care Management services, agreed to services, and gave verbal consent prior to initiation of services.  Please see initial visit note for detailed documentation.   Patient agreed to services and verbal consent obtained.   Assessment: Review of patient past medical history, allergies, medications, health status, including review of consultants reports, laboratory and other test data, was performed as part of comprehensive evaluation and provision of chronic care management services.   SDOH (Social Determinants of Health) assessments and interventions performed:    CCM Care Plan  No Known Allergies  Outpatient Encounter Medications as of 06/02/2021  Medication Sig   aspirin 81 MG tablet Take 81 mg by mouth daily.   Cholecalciferol (VITAMIN D-3) 1000 UNITS CAPS Take 1 capsule by mouth daily.   ferrous sulfate ([facility] IRON) 325 (65 FE) MG tablet TAKE 1 TABLET (325 MG TOTAL) BY MOUTH 2 (TWICE) TIMES DAILY WITH MEALS.   hydrochlorothiazide (MICROZIDE) 12.5 MG capsule TAKE 1 CAPSULE BY MOUTH EVERY DAY   linaclotide (LINZESS) 145 MCG CAPS capsule Take 1 capsule (145 mcg total) by mouth daily before breakfast.   lisinopril (ZESTRIL) 10 MG tablet TAKE 1 TABLET BY MOUTH EVERY DAY   rosuvastatin (CRESTOR) 10 MG tablet TAKE 1 TABLET BY MOUTH EVERY DAY (Patient taking differently: TAKE 1 TABLET BY MOUTH ON Monday, Wednesday AND FRIDAYS)   Vibegron (GEMTESA) 75  MG TABS Take by mouth.   No facility-administered encounter medications on file as of 06/02/2021.    Patient Active Problem List   Diagnosis Date Noted   Prediabetes 02/02/2019   Primary osteoarthritis of left hip 04/11/2018   Preop cardiovascular exam 03/30/2018   Abnormal echocardiogram 03/30/2018   Obese abdomen 07/22/2016   Retrognathia 11/03/2015   Morbid obesity ([place]) 11/03/2015   Cardiac hypertrophy 11/03/2015   OSA on CPAP 11/03/2015   Degenerative joint disease (DJD) of hip 03/12/2014   OBESITY, NOS 10/13/2006   HYPERTENSION, BENIGN SYSTEMIC 10/13/2006   CHF, EJECTION FRACTION > OR = 50% 10/13/2006   CARDIOMEGALY 10/13/2006   HEMORRHOIDS, NOS 10/13/2006   GASTROESOPHAGEAL REFLUX, NO ESOPHAGITIS 10/13/2006   IMPOTENCE, 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   Sleep apnea 10/13/2006    Conditions to be addressed/monitored: Prediabetes, HTN, Mixed Hyperlipidemia, Constipation, unspecified, Iron deficiency Anemia   Care Plan : Abnormal Abdominal Ultrasoud  Updates made by Lynne Logan, RN since 06/02/2021 12:00 AM     Problem: Abnormal Abdominal Ultrasound   Priority: High     Goal: Abnormal Abdominal US evaluated and treated   Start Date: 05/26/2021  Expected End Date: 08/26/2021  Recent Progress: On track  Priority: High  Note:   Current Barriers:  Ineffective Self Health Maintenance in a patient with  Prediabetes, HTN, Mixed Hyperlipidemia, Constipation, unspecified, Iron deficiency Anemia  Clinical Goal(s):  Collaboration with [name], FNP regarding development and update of comprehensive plan of care as evidenced by provider attestation and co-signature Inter-disciplinary care team collaboration (see longitudinal plan of care) patient will work with care  management team to address care coordination and chronic disease management needs related to Disease Management Educational Needs Care Coordination Medication Management and Education Medication  Reconciliation Psychosocial Support   Interventions:  05/26/21 completed successful inbound call from patient  Evaluation of current treatment plan related to  Prediabetes, HTN, Mixed Hyperlipidemia, Constipation, unspecified, Iron deficiency Anemia  ,  self-management and patient's adherence to plan as established by provider. Collaboration with [name], FNP regarding development and update of comprehensive plan of care as evidenced by provider attestation       and co-signature Inter-disciplinary care team collaboration (see longitudinal plan of care) Determined patient received my chart results from a recent Abdominal Ultrasound and has questions  Collaborated with PCP [name] FNP regarding patient results and her recommendations Determined PCP would like to refer patient to a GI specialist for further evaluation of his abnormal abdominal US Informed patient of PCP recommendations for GI follow up and answered all patient questions to his satisfaction, patient is agreeable to GI referral, informed patient of referral process and to allow up to 2 weeks for referral to be authorized Educated patient on signs/symptoms to report to PCP promptly if they occur Sent in basket message to PCP advising patient is agreeable to GI referral  Discussed plans with patient for ongoing care management follow up and provided patient with direct contact information for care management team 06/02/21 completed successful outbound call with patient  Determined patient has not received a call from GI Reviewed chart,noted a GI referral is not showing up in [place] with PCP regarding GI referral, reply received from PCP stating a referral for [facility] has just been placed by PCP Advised patient the referral was just placed, educated on referral process and expected processing time, patient verbalizes understanding Determined patient needs new Rx for Hydrocortisone 2.5% for dermatitis, and  Shingrix vaccine sent to pharmacy, PCP agreed to send Rx to preferred pharmacy Discussed plans with patient for ongoing care management follow up and provided patient with direct contact information for care management team Self Care Activities:  Self administers medications as prescribed Attends all scheduled provider appointments Calls pharmacy for medication refills Calls provider office for new concerns or questions Patient Goals: - follow up with GI Specialist as directed - report any/all GI symptoms as discussed to PCP promptly for further evaluation   Follow Up Plan: Telephone follow up appointment with care management team member scheduled for: 06/03/21      Plan:Telephone follow up appointment with care management team member scheduled for:  06/03/21  Barb Merino, RN, BSN, CCM Care Management Coordinator [place] [facility]  Direct Phone: [phone]

## 2021-06-03 ENCOUNTER — Other Ambulatory Visit: Payer: Self-pay | Admitting: Nurse Practitioner

## 2021-06-03 ENCOUNTER — Ambulatory Visit: Payer: Self-pay

## 2021-06-03 ENCOUNTER — Encounter: Payer: Self-pay | Admitting: Gastroenterology

## 2021-06-03 ENCOUNTER — Ambulatory Visit: Payer: Medicare HMO

## 2021-06-03 DIAGNOSIS — E782 Mixed hyperlipidemia: Secondary | ICD-10-CM

## 2021-06-03 DIAGNOSIS — D508 Other iron deficiency anemias: Secondary | ICD-10-CM

## 2021-06-03 DIAGNOSIS — K5900 Constipation, unspecified: Secondary | ICD-10-CM

## 2021-06-03 DIAGNOSIS — I10 Essential (primary) hypertension: Secondary | ICD-10-CM

## 2021-06-03 DIAGNOSIS — R7303 Prediabetes: Secondary | ICD-10-CM

## 2021-06-03 DIAGNOSIS — R748 Abnormal levels of other serum enzymes: Secondary | ICD-10-CM

## 2021-06-03 DIAGNOSIS — Z23 Encounter for immunization: Secondary | ICD-10-CM

## 2021-06-03 MED ORDER — HYDROCORTISONE 2.5 % EX OINT: TOPICAL_OINTMENT | Freq: Two times a day (BID) | CUTANEOUS | 0 refills | Status: DC

## 2021-06-03 MED ORDER — SHINGRIX 50 MCG/0.5ML IM SUSR: 0.5000 mL | Freq: Once | INTRAMUSCULAR | 1 refills | Status: AC

## 2021-06-03 NOTE — Patient Instructions (Signed)
Visit Information  PATIENT GOALS:  Goals Addressed      Abnormal Abdominal US evaluated and treated   On track    Timeframe:  Short-Term Goal Priority:  High Start Date: 05/26/21                            Expected End Date:  08/26/21   Follow up date: 07/16/21  Patient Goals: - follow up with GI Specialist as directed - report any/all GI symptoms as discussed to PCP promptly for further evaluation                         The patient verbalized understanding of instructions, educational materials, and care plan provided today and declined offer to receive copy of patient instructions, educational materials, and care plan.   Telephone follow up appointment with care management team member scheduled for: 07/08/21  Barb Merino, RN, BSN, CCM Care Management Coordinator [place] [facility]  Direct Phone: [phone]

## 2021-06-03 NOTE — Chronic Care Management (AMB) (Signed)
Chronic Care Management   CCM RN Visit Note  06/03/2021 Name: Kevin Weiss MRN: [id number] DOB: 1941-08-12  Subjective: Kevin Weiss is a 80 y.o. year old male who is a primary care patient of Kevin Weiss, [place]. The care management team was consulted for assistance with disease management and care coordination needs.    Engaged with patient by telephone for follow up visit in response to provider referral for case management and/or care coordination services.   Consent to Services:  The patient was given information about Chronic Care Management services, agreed to services, and gave verbal consent prior to initiation of services.  Please see initial visit note for detailed documentation.   Patient agreed to services and verbal consent obtained.   Assessment: Review of patient past medical history, allergies, medications, health status, including review of consultants reports, laboratory and other test data, was performed as part of comprehensive evaluation and provision of chronic care management services.   SDOH (Social Determinants of Health) assessments and interventions performed:    CCM Care Plan  No Known Allergies  Outpatient Encounter Medications as of 06/03/2021  Medication Sig   aspirin 81 MG tablet Take 81 mg by mouth daily.   Cholecalciferol (VITAMIN D-3) 1000 UNITS CAPS Take 1 capsule by mouth daily.   ferrous sulfate ([facility] IRON) 325 (65 FE) MG tablet TAKE 1 TABLET (325 MG TOTAL) BY MOUTH 2 (TWICE) TIMES DAILY WITH MEALS.   hydrochlorothiazide (MICROZIDE) 12.5 MG capsule TAKE 1 CAPSULE BY MOUTH EVERY DAY   hydrocortisone 2.5 % ointment Apply topically 2 (two) times daily.   linaclotide (LINZESS) 145 MCG CAPS capsule Take 1 capsule (145 mcg total) by mouth daily before breakfast.   lisinopril (ZESTRIL) 10 MG tablet TAKE 1 TABLET BY MOUTH EVERY DAY   rosuvastatin (CRESTOR) 10 MG tablet TAKE 1 TABLET BY MOUTH EVERY DAY (Patient taking differently: TAKE 1 TABLET  BY MOUTH ON Monday, Wednesday AND FRIDAYS)   Vibegron (GEMTESA) 75 MG TABS Take by mouth.   Zoster Vaccine Adjuvanted [facility]) injection Inject 0.5 mLs into the muscle once for 1 dose.   No facility-administered encounter medications on file as of 06/03/2021.    Patient Active Problem List   Diagnosis Date Noted   Prediabetes 02/02/2019   Primary osteoarthritis of left hip 04/11/2018   Preop cardiovascular exam 03/30/2018   Abnormal echocardiogram 03/30/2018   Obese abdomen 07/22/2016   Retrognathia 11/03/2015   Morbid obesity ([place]) 11/03/2015   Cardiac hypertrophy 11/03/2015   OSA on CPAP 11/03/2015   Degenerative joint disease (DJD) of hip 03/12/2014   OBESITY, NOS 10/13/2006   HYPERTENSION, BENIGN SYSTEMIC 10/13/2006   CHF, EJECTION FRACTION > OR = 50% 10/13/2006   CARDIOMEGALY 10/13/2006   HEMORRHOIDS, NOS 10/13/2006   GASTROESOPHAGEAL REFLUX, NO ESOPHAGITIS 10/13/2006   IMPOTENCE, 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   Sleep apnea 10/13/2006    Conditions to be addressed/monitored: Prediabetes, HTN, Mixed Hyperlipidemia, Constipation, unspecified, Iron deficiency Anemia   Care Plan : Abnormal Abdominal Ultrasoud  Updates made by Lynne Logan, RN since 06/03/2021 12:00 AM     Problem: Abnormal Abdominal Ultrasound   Priority: High     Goal: Abnormal Abdominal US evaluated and treated   Start Date: 05/26/2021  Expected End Date: 08/26/2021  Recent Progress: On track  Priority: High  Note:   Current Barriers:  Ineffective Self Health Maintenance in a patient with  Prediabetes, HTN, Mixed Hyperlipidemia, Constipation, unspecified, Iron deficiency Anemia  Clinical Goal(s):  Collaboration with Kevin Brine, FNP regarding  development and update of comprehensive plan of care as evidenced by provider attestation and co-signature Inter-disciplinary care team collaboration (see longitudinal plan of care) patient will work with care management team to address care coordination and  chronic disease management needs related to Disease Management Educational Needs Care Coordination Medication Management and Education Medication Reconciliation Psychosocial Support   Interventions:  05/26/21 completed successful inbound call from patient  Evaluation of current treatment plan related to  Prediabetes, HTN, Mixed Hyperlipidemia, Constipation, unspecified, Iron deficiency Anemia  ,  self-management and patient's adherence to plan as established by provider. Collaboration with Kevin Brine, FNP regarding development and update of comprehensive plan of care as evidenced by provider attestation       and co-signature Inter-disciplinary care team collaboration (see longitudinal plan of care) Determined patient received my chart results from a recent Abdominal Ultrasound and has questions  Collaborated with PCP Kevin Brine FNP regarding patient results and her recommendations Determined PCP would like to refer patient to a GI specialist for further evaluation of his abnormal abdominal US Informed patient of PCP recommendations for GI follow up and answered all patient questions to his satisfaction, patient is agreeable to GI referral, informed patient of referral process and to allow up to 2 weeks for referral to be authorized Educated patient on signs/symptoms to report to PCP promptly if they occur Sent in basket message to PCP advising patient is agreeable to GI referral  Discussed plans with patient for ongoing care management follow up and provided patient with direct contact information for care management team 06/02/21 completed successful outbound call with patient  Determined patient has not received a call from GI Reviewed chart,noted a GI referral is not showing up in [place] with PCP regarding GI referral, reply received from PCP stating a referral for [facility] has just been placed by PCP Advised patient the referral was just placed, educated on referral  process and expected processing time, patient verbalizes understanding Determined patient needs new Rx for Hydrocortisone 2.5% for dermatitis, and Shingrix vaccine sent to pharmacy, PCP agreed to send Rx to preferred pharmacy Discussed plans with patient for ongoing care management follow up and provided patient with direct contact information for care management team 06/03/21 completed successful outbound call with patient  Notified patient the GI referral to [facility] has been entered into Epic and authorized  Provided patient with the contact number for this provider, advised he may wait to hear from the provider and or may contact the provider to schedule a new patient appointment Updated patient's immunization record, advised PCP sent RN for Shingrix vaccine Discussed plans with patient for ongoing care management follow up and provided patient with direct contact information for care management team Self Care Activities:  Self administers medications as prescribed Attends all scheduled provider appointments Calls pharmacy for medication refills Calls provider office for new concerns or questions Patient Goals: - follow up with GI Specialist as directed - report any/all GI symptoms as discussed to PCP promptly for further evaluation   Follow Up Plan: Telephone follow up appointment with care management team member scheduled for: 07/16/21      Plan:Telephone follow up appointment with care management team member scheduled for:  07/08/21  Barb Merino, RN, BSN, CCM Care Management Coordinator [place] [facility]  Direct Phone: [phone]

## 2021-06-04 ENCOUNTER — Ambulatory Visit: Payer: Self-pay

## 2021-06-04 ENCOUNTER — Telehealth: Payer: Medicare HMO

## 2021-06-04 DIAGNOSIS — K5900 Constipation, unspecified: Secondary | ICD-10-CM

## 2021-06-04 DIAGNOSIS — E782 Mixed hyperlipidemia: Secondary | ICD-10-CM

## 2021-06-04 DIAGNOSIS — I10 Essential (primary) hypertension: Secondary | ICD-10-CM

## 2021-06-04 DIAGNOSIS — R748 Abnormal levels of other serum enzymes: Secondary | ICD-10-CM

## 2021-06-04 DIAGNOSIS — R7303 Prediabetes: Secondary | ICD-10-CM

## 2021-06-04 DIAGNOSIS — D508 Other iron deficiency anemias: Secondary | ICD-10-CM

## 2021-06-04 NOTE — Patient Instructions (Signed)
Visit Information  PATIENT GOALS:  Goals Addressed      Abnormal Abdominal US evaluated and treated   On track    Timeframe:  Short-Term Goal Priority:  High Start Date: 05/26/21                            Expected End Date:  08/26/21   Follow up date: 07/16/21  Patient Goals: - follow up with GI Specialist as directed - report any/all GI symptoms as discussed to PCP promptly for further evaluation                            The patient verbalized understanding of instructions, educational materials, and care plan provided today and declined offer to receive copy of patient instructions, educational materials, and care plan.   Telephone follow up appointment with care management team member scheduled for: 07/16/21  Barb Merino, RN, BSN, CCM Care Management Coordinator [place] [facility]  Direct Phone: [phone]

## 2021-06-04 NOTE — Chronic Care Management (AMB) (Signed)
Chronic Care Management   Follow Up Note   06/03/2021 Name: Kevin Weiss MRN: [id number] DOB: 03/13/1941  Referred by: [name], FNP Reason for referral : Chronic Care Management (RN CM Follow up call )   Kevin Weiss is a 80 y.o. year old male who is a primary care patient of [name], [place]. The CCM team was consulted for assistance with chronic disease management and care coordination needs.    Review of patient status, including review of consultants reports, relevant laboratory and other test results, and collaboration with appropriate care team members and the patient's provider was performed as part of comprehensive patient evaluation and provision of chronic care management services.    SDOH (Social Determinants of Health) assessments performed: No See Care Plan activities for detailed interventions related to [facility])     Outpatient Encounter Medications as of 06/03/2021  Medication Sig   aspirin 81 MG tablet Take 81 mg by mouth daily.   Cholecalciferol (VITAMIN D-3) 1000 UNITS CAPS Take 1 capsule by mouth daily.   ferrous sulfate ([facility] IRON) 325 (65 FE) MG tablet TAKE 1 TABLET (325 MG TOTAL) BY MOUTH 2 (TWICE) TIMES DAILY WITH MEALS.   hydrochlorothiazide (MICROZIDE) 12.5 MG capsule TAKE 1 CAPSULE BY MOUTH EVERY DAY   hydrocortisone 2.5 % ointment Apply topically 2 (two) times daily.   linaclotide (LINZESS) 145 MCG CAPS capsule Take 1 capsule (145 mcg total) by mouth daily before breakfast.   lisinopril (ZESTRIL) 10 MG tablet TAKE 1 TABLET BY MOUTH EVERY DAY   rosuvastatin (CRESTOR) 10 MG tablet TAKE 1 TABLET BY MOUTH EVERY DAY (Patient taking differently: TAKE 1 TABLET BY MOUTH ON Monday, Wednesday AND FRIDAYS)   Vibegron (GEMTESA) 75 MG TABS Take by mouth.   [EXPIRED] Zoster Vaccine Adjuvanted [facility]) injection Inject 0.5 mLs into the muscle once for 1 dose.   No facility-administered encounter medications on file as of 06/03/2021.     Objective:     Goals Addressed      Abnormal Abdominal US evaluated and treated   On track    Timeframe:  Short-Term Goal Priority:  High Start Date: 05/26/21                            Expected End Date:  08/26/21   Follow up date: 07/16/21  Patient Goals: - follow up with GI Specialist as directed - report any/all GI symptoms as discussed to PCP promptly for further evaluation                        Patient Care Plan: Abnormal Abdominal Ultrasoud     Problem Identified: Abnormal Abdominal Ultrasound   Priority: High     Goal: Abnormal Abdominal US evaluated and treated   Start Date: 05/26/2021  Expected End Date: 08/26/2021  Recent Progress: On track  Priority: High  Note:   Current Barriers:  Ineffective Self Health Maintenance in a patient with  Prediabetes, HTN, Mixed Hyperlipidemia, Constipation, unspecified, Iron deficiency Anemia  Clinical Goal(s):  Collaboration with [name], FNP regarding development and update of comprehensive plan of care as evidenced by provider attestation and co-signature Inter-disciplinary care team collaboration (see longitudinal plan of care) patient will work with care management team to address care coordination and chronic disease management needs related to Disease Management Educational Needs Care Coordination Medication Management and Education Medication Reconciliation Psychosocial Support   Interventions:  05/26/21 completed successful inbound  call from patient  Evaluation of current treatment plan related to  Prediabetes, HTN, Mixed Hyperlipidemia, Constipation, unspecified, Iron deficiency Anemia  ,  self-management and patient's adherence to plan as established by provider. Collaboration with [name], FNP regarding development and update of comprehensive plan of care as evidenced by provider attestation       and co-signature Inter-disciplinary care team collaboration (see longitudinal plan of care) Determined patient received my  chart results from a recent Abdominal Ultrasound and has questions  Collaborated with PCP [name] FNP regarding patient results and her recommendations Determined PCP would like to refer patient to a GI specialist for further evaluation of his abnormal abdominal US Informed patient of PCP recommendations for GI follow up and answered all patient questions to his satisfaction, patient is agreeable to GI referral, informed patient of referral process and to allow up to 2 weeks for referral to be authorized Educated patient on signs/symptoms to report to PCP promptly if they occur Sent in basket message to PCP advising patient is agreeable to GI referral  Discussed plans with patient for ongoing care management follow up and provided patient with direct contact information for care management team 06/02/21 completed successful outbound call with patient  Determined patient has not received a call from GI Reviewed chart,noted a GI referral is not showing up in [place] with PCP regarding GI referral, reply received from PCP stating a referral for [facility] has just been placed by PCP Advised patient the referral was just placed, educated on referral process and expected processing time, patient verbalizes understanding Determined patient needs new Rx for Hydrocortisone 2.5% for dermatitis, and Shingrix vaccine sent to pharmacy, PCP agreed to send Rx to preferred pharmacy Discussed plans with patient for ongoing care management follow up and provided patient with direct contact information for care management team 06/03/21 completed successful outbound call with patient  Notified patient the GI referral to [facility] has been entered into Epic and authorized  Provided patient with the contact number for this provider, advised he may wait to hear from the provider and or may contact the provider to schedule a new patient appointment Updated patient's immunization record, advised PCP sent RN  for Shingrix vaccine Discussed plans with patient for ongoing care management follow up and provided patient with direct contact information for care management team 06/04/21 Inbound call from patient  Received voice message from patient stating he is scheduled to follow up with Dr. Candis Schatz with [facility] on November 14,2022 @10 :10 AM Sent seure message to PCP notifying of patient's scheduled GI appointment  Self Care Activities:  Self administers medications as prescribed Attends all scheduled provider appointments Calls pharmacy for medication refills Calls provider office for new concerns or questions Patient Goals: - follow up with GI Specialist as directed - report any/all GI symptoms as discussed to PCP promptly for further evaluation   Follow Up Plan: Telephone follow up appointment with care management team member scheduled for: 07/16/21      Plan:   Telephone follow up appointment with care management team member scheduled for: 07/17/21   Barb Merino, RN, BSN, CCM Care Management Coordinator [place] [facility]  Direct Phone: [phone]

## 2021-06-04 NOTE — Chronic Care Management (AMB) (Signed)
Chronic Care Management   CCM RN Visit Note  06/04/2021 Name: Kevin Weiss MRN: [id number] DOB: 10/18/1940  Subjective: Kevin Weiss is a 80 y.o. year old male who is a primary care patient of [name], [place]. The care management team was consulted for assistance with disease management and care coordination needs.    Engaged with patient by telephone for follow up visit in response to provider referral for case management and/or care coordination services.   Consent to Services:  The patient was given information about Chronic Care Management services, agreed to services, and gave verbal consent prior to initiation of services.  Please see initial visit note for detailed documentation.   Patient agreed to services and verbal consent obtained.   Assessment: Review of patient past medical history, allergies, medications, health status, including review of consultants reports, laboratory and other test data, was performed as part of comprehensive evaluation and provision of chronic care management services.   SDOH (Social Determinants of Health) assessments and interventions performed:    CCM Care Plan  No Known Allergies  Outpatient Encounter Medications as of 06/04/2021  Medication Sig   aspirin 81 MG tablet Take 81 mg by mouth daily.   Cholecalciferol (VITAMIN D-3) 1000 UNITS CAPS Take 1 capsule by mouth daily.   ferrous sulfate ([facility] IRON) 325 (65 FE) MG tablet TAKE 1 TABLET (325 MG TOTAL) BY MOUTH 2 (TWICE) TIMES DAILY WITH MEALS.   hydrochlorothiazide (MICROZIDE) 12.5 MG capsule TAKE 1 CAPSULE BY MOUTH EVERY DAY   hydrocortisone 2.5 % ointment Apply topically 2 (two) times daily.   linaclotide (LINZESS) 145 MCG CAPS capsule Take 1 capsule (145 mcg total) by mouth daily before breakfast.   lisinopril (ZESTRIL) 10 MG tablet TAKE 1 TABLET BY MOUTH EVERY DAY   rosuvastatin (CRESTOR) 10 MG tablet TAKE 1 TABLET BY MOUTH EVERY DAY (Patient taking differently: TAKE 1 TABLET  BY MOUTH ON Monday, Wednesday AND FRIDAYS)   Vibegron (GEMTESA) 75 MG TABS Take by mouth.   No facility-administered encounter medications on file as of 06/04/2021.    Patient Active Problem List   Diagnosis Date Noted   Prediabetes 02/02/2019   Primary osteoarthritis of left hip 04/11/2018   Preop cardiovascular exam 03/30/2018   Abnormal echocardiogram 03/30/2018   Obese abdomen 07/22/2016   Retrognathia 11/03/2015   Morbid obesity ([place]) 11/03/2015   Cardiac hypertrophy 11/03/2015   OSA on CPAP 11/03/2015   Degenerative joint disease (DJD) of hip 03/12/2014   OBESITY, NOS 10/13/2006   HYPERTENSION, BENIGN SYSTEMIC 10/13/2006   CHF, EJECTION FRACTION > OR = 50% 10/13/2006   CARDIOMEGALY 10/13/2006   HEMORRHOIDS, NOS 10/13/2006   GASTROESOPHAGEAL REFLUX, NO ESOPHAGITIS 10/13/2006   IMPOTENCE, 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   Sleep apnea 10/13/2006    Conditions to be addressed/monitored: Prediabetes, HTN, Mixed Hyperlipidemia, Constipation, unspecified, Iron deficiency Anemia, Elevated alkaline phosphatase   Care Plan : Abnormal Abdominal Ultrasoud  Updates made by Lynne Logan, RN since 06/04/2021 12:00 AM     Problem: Abnormal Abdominal Ultrasound   Priority: High     Goal: Abnormal Abdominal US evaluated and treated   Start Date: 05/26/2021  Expected End Date: 08/26/2021  Recent Progress: On track  Priority: High  Note:   Current Barriers:  Ineffective Self Health Maintenance in a patient with  Prediabetes, HTN, Mixed Hyperlipidemia, Constipation, unspecified, Iron deficiency Anemia  Clinical Goal(s):  Collaboration with [name], FNP regarding development and update of comprehensive plan of care as evidenced by provider attestation and  co-signature Inter-disciplinary care team collaboration (see longitudinal plan of care) patient will work with care management team to address care coordination and chronic disease management needs related to Disease  Management Educational Needs Care Coordination Medication Management and Education Medication Reconciliation Psychosocial Support   Interventions:  05/26/21 completed successful inbound call from patient  Evaluation of current treatment plan related to  Prediabetes, HTN, Mixed Hyperlipidemia, Constipation, unspecified, Iron deficiency Anemia  ,  self-management and patient's adherence to plan as established by provider. Collaboration with [name], FNP regarding development and update of comprehensive plan of care as evidenced by provider attestation       and co-signature Inter-disciplinary care team collaboration (see longitudinal plan of care) Determined patient received my chart results from a recent Abdominal Ultrasound and has questions  Collaborated with PCP [name] FNP regarding patient results and her recommendations Determined PCP would like to refer patient to a GI specialist for further evaluation of his abnormal abdominal US Informed patient of PCP recommendations for GI follow up and answered all patient questions to his satisfaction, patient is agreeable to GI referral, informed patient of referral process and to allow up to 2 weeks for referral to be authorized Educated patient on signs/symptoms to report to PCP promptly if they occur Sent in basket message to PCP advising patient is agreeable to GI referral  Discussed plans with patient for ongoing care management follow up and provided patient with direct contact information for care management team 06/02/21 completed successful outbound call with patient  Determined patient has not received a call from GI Reviewed chart,noted a GI referral is not showing up in [place] with PCP regarding GI referral, reply received from PCP stating a referral for [facility] has just been placed by PCP Advised patient the referral was just placed, educated on referral process and expected processing time, patient verbalizes  understanding Determined patient needs new Rx for Hydrocortisone 2.5% for dermatitis, and Shingrix vaccine sent to pharmacy, PCP agreed to send Rx to preferred pharmacy Discussed plans with patient for ongoing care management follow up and provided patient with direct contact information for care management team 06/03/21 completed successful outbound call with patient  Notified patient the GI referral to [facility] has been entered into Epic and authorized  Provided patient with the contact number for this provider, advised he may wait to hear from the provider and or may contact the provider to schedule a new patient appointment Updated patient's immunization record, advised PCP sent RN for Shingrix vaccine Discussed plans with patient for ongoing care management follow up and provided patient with direct contact information for care management team 06/04/21 Inbound call from patient  Received voice message from patient stating he is scheduled to follow up with Dr. Candis Schatz with [facility] on November 14,2022 @10 :10 AM Sent secure message to PCP notifying of patients scheduled GI appointment  Self Care Activities:  Self administers medications as prescribed Attends all scheduled provider appointments Calls pharmacy for medication refills Calls provider office for new concerns or questions Patient Goals: - follow up with GI Specialist as directed - report any/all GI symptoms as discussed to PCP promptly for further evaluation   Follow Up Plan: Telephone follow up appointment with care management team member scheduled for: 07/16/21      Plan:Telephone follow up appointment with care management team member scheduled for:  07/16/21  Barb Merino, RN, BSN, CCM Care Management Coordinator [place] [facility]  Direct Phone: [phone]

## 2021-06-09 ENCOUNTER — Ambulatory Visit (INDEPENDENT_AMBULATORY_CARE_PROVIDER_SITE_OTHER): Payer: Medicare HMO

## 2021-06-09 ENCOUNTER — Other Ambulatory Visit: Payer: Self-pay

## 2021-06-09 VITALS — BP 136/74 | HR 70 | Temp 98.3°F | Ht 67.2 in | Wt 253.6 lb

## 2021-06-09 DIAGNOSIS — Z23 Encounter for immunization: Secondary | ICD-10-CM

## 2021-06-09 NOTE — Patient Instructions (Signed)
Influenza Virus Vaccine injection (Fluarix) What is this medication? INFLUENZA VIRUS VACCINE (in floo EN zuh VAHY ruhs vak SEEN) helps to reduce the risk of getting influenza also known as the flu. This medicine may be used for other purposes; ask your health care provider or pharmacist if you have questions. COMMON BRAND NAME(S): Fluarix, Fluzone What should I tell my care team before I take this medication? They need to know if you have any of these conditions: bleeding disorder like hemophilia fever or infection Guillain-Barre syndrome or other neurological problems immune system problems infection with the human immunodeficiency virus (HIV) or AIDS low blood platelet counts multiple sclerosis an unusual or allergic reaction to influenza virus vaccine, eggs, chicken proteins, latex, gentamicin, other medicines, foods, dyes or preservatives pregnant or trying to get pregnant breast-feeding How should I use this medication? This vaccine is for injection into a muscle. It is given by a health care professional. A copy of Vaccine Information Statements will be given before each vaccination. Read this sheet carefully each time. The sheet may change frequently. Talk to your pediatrician regarding the use of this medicine in children. Special care may be needed. Overdosage: If you think you have taken too much of this medicine contact a poison control center or emergency room at once. NOTE: This medicine is only for you. Do not share this medicine with others. What if I miss a dose? This does not apply. What may interact with this medication? chemotherapy or radiation therapy medicines that lower your immune system like etanercept, anakinra, infliximab, and adalimumab medicines that treat or prevent blood clots like warfarin phenytoin steroid medicines like prednisone or cortisone theophylline vaccines This list may not describe all possible interactions. Give your health care provider a  list of all the medicines, herbs, non-prescription drugs, or dietary supplements you use. Also tell them if you smoke, drink alcohol, or use illegal drugs. Some items may interact with your medicine. What should I watch for while using this medication? Report any side effects that do not go away within 3 days to your doctor or health care professional. Call your health care provider if any unusual symptoms occur within 6 weeks of receiving this vaccine. You may still catch the flu, but the illness is not usually as bad. You cannot get the flu from the vaccine. The vaccine will not protect against colds or other illnesses that may cause fever. The vaccine is needed every year. What side effects may I notice from receiving this medication? Side effects that you should report to your doctor or health care professional as soon as possible: allergic reactions like skin rash, itching or hives, swelling of the face, lips, or tongue Side effects that usually do not require medical attention (report to your doctor or health care professional if they continue or are bothersome): fever headache muscle aches and pains pain, tenderness, redness, or swelling at site where injected weak or tired This list may not describe all possible side effects. Call your doctor for medical advice about side effects. You may report side effects to FDA at 1-800-FDA-1088. Where should I keep my medication? This vaccine is only given in a clinic, pharmacy, doctor's office, or other health care setting and will not be stored at home. NOTE: This sheet is a summary. It may not cover all possible information. If you have questions about this medicine, talk to your doctor, pharmacist, or health care provider.  2022 Elsevier/Gold Standard (2008-02-28 09:30:40)

## 2021-06-09 NOTE — Progress Notes (Signed)
Pt here for flu vaccine.

## 2021-06-15 DIAGNOSIS — E782 Mixed hyperlipidemia: Secondary | ICD-10-CM | POA: Diagnosis not present

## 2021-06-15 DIAGNOSIS — I10 Essential (primary) hypertension: Secondary | ICD-10-CM | POA: Diagnosis not present

## 2021-06-15 DIAGNOSIS — D508 Other iron deficiency anemias: Secondary | ICD-10-CM

## 2021-06-17 ENCOUNTER — Other Ambulatory Visit: Payer: Self-pay | Admitting: Nurse Practitioner

## 2021-06-17 DIAGNOSIS — D508 Other iron deficiency anemias: Secondary | ICD-10-CM

## 2021-06-26 ENCOUNTER — Telehealth: Payer: Self-pay

## 2021-06-26 NOTE — Chronic Care Management (AMB) (Signed)
    Chronic Care Management Pharmacy Assistant   Name: [name]  MRN: [id number] DOB: 15-Jun-1941  Reason for Encounter: 2023 PAP  Medications: Outpatient Encounter Medications as of 06/26/2021  Medication Sig   ferrous sulfate 325 (65 FE) MG tablet TAKE 1 TABLET (325 MG TOTAL) BY MOUTH 2 (TWICE) TIMES DAILY WITH MEALS.   aspirin 81 MG tablet Take 81 mg by mouth daily.   Cholecalciferol (VITAMIN D-3) 1000 UNITS CAPS Take 1 capsule by mouth daily.   hydrochlorothiazide (MICROZIDE) 12.5 MG capsule TAKE 1 CAPSULE BY MOUTH EVERY DAY   hydrocortisone 2.5 % ointment Apply topically 2 (two) times daily.   linaclotide (LINZESS) 145 MCG CAPS capsule Take 1 capsule (145 mcg total) by mouth daily before breakfast.   lisinopril (ZESTRIL) 10 MG tablet TAKE 1 TABLET BY MOUTH EVERY DAY   rosuvastatin (CRESTOR) 10 MG tablet TAKE 1 TABLET BY MOUTH EVERY DAY (Patient taking differently: TAKE 1 TABLET BY MOUTH ON Monday, Wednesday AND FRIDAYS)   Vibegron (GEMTESA) 75 MG TABS Take by mouth.   No facility-administered encounter medications on file as of 06/26/2021.   06-26-2021: Initiated [id number] application for [place]. Left patient a voicemail informing him that application will be mailed out to sign and return to the office with income.  [place] Pharmacist Assistant [phone]

## 2021-06-29 ENCOUNTER — Other Ambulatory Visit: Payer: Medicare HMO

## 2021-06-29 ENCOUNTER — Encounter: Payer: Self-pay | Admitting: Gastroenterology

## 2021-06-29 ENCOUNTER — Ambulatory Visit (INDEPENDENT_AMBULATORY_CARE_PROVIDER_SITE_OTHER): Payer: Medicare HMO | Admitting: Gastroenterology

## 2021-06-29 VITALS — BP 130/72 | HR 80 | Ht 66.5 in | Wt 255.0 lb

## 2021-06-29 DIAGNOSIS — R932 Abnormal findings on diagnostic imaging of liver and biliary tract: Secondary | ICD-10-CM

## 2021-06-29 DIAGNOSIS — R748 Abnormal levels of other serum enzymes: Secondary | ICD-10-CM

## 2021-06-29 DIAGNOSIS — K760 Fatty (change of) liver, not elsewhere classified: Secondary | ICD-10-CM | POA: Diagnosis not present

## 2021-06-29 NOTE — Progress Notes (Signed)
HPI : Kevin Weiss is a very pleasant 80 year old male with a history of hypertension and obesity who is referred to [place] for chronically elevated alkaline phosphatase.  His alkaline phosphatase appears to have been persistently elevated since at least September 2020.  Alkaline phosphatase elevation has been stable at around 200-220.  His other liver enzymes have always been completely unremarkable.  A GGT level was unremarkable (30).  An ultrasound demonstrated a coarsened echotexture of the liver consistent with moderate to severe hepatic steatosis, as well as numerous cysts.  No intrahepatic ductal dilation.  The common bile duct was dilated at 10 mm.  Adenomyosis was noted of the gallbladder.  Viral serologies were negative. His weight has been stable.  No history of heavy alcohol use.  Drinks rarely.  Tries to avoid fried foods.  No history of pancreatitis or cholangitis. No family history of liver disease. He has a history of osteoarthritis (s/p b/l THA), sometimes has L wrist pain, otherwise denies joint pains. He has occasional constipation, but denies any other chronic GI symptoms, such as abdominal pain, nausea/vomiting or diarrhea. He had a colonoscopy in January 2020 by Dr. Collene Mares.  2 subcentimeter tubular adenomas were removed from the ascending colon.  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   Qualifier: Diagnosis of  By: Eusebio Friendly     Morbid obesity ([place]) 11/03/2015   OSA on  CPAP 11/03/2015   Retrognathia 11/03/2015     Past Surgical History:  Procedure Laterality Date   COLONOSCOPY     KNEE ARTHROSCOPY Left 15+yrs ago   TOTAL HIP ARTHROPLASTY Right 03/12/2014   Procedure: TOTAL HIP ARTHROPLASTY ANTERIOR APPROACH;  Surgeon: Hessie Dibble, MD;  Location: [place];  Service: Orthopedics;  Laterality: Right;   TOTAL HIP ARTHROPLASTY Left 04/11/2018   Procedure: TOTAL HIP ARTHROPLASTY ANTERIOR APPROACH;  Surgeon: Melrose Nakayama, MD;  Location: [place];  Service: Orthopedics;  Laterality: Left;   Family History  Problem Relation Age of Onset   Hypertension Mother    Hyperlipidemia Mother    Alzheimer's disease Mother    Cancer Mother        type unbknown   Heart disease Father    Hyperlipidemia Father    Hypertension Father    Hypertension Sister    Hypertension Brother    Heart attack Maternal Grandmother    Social History   Tobacco Use   Smoking status: Former    Packs/day: 0.25    Years: 10.00    Pack years: 2.50    Types: Cigarettes    Quit date: 1982    Years since quitting: 40.8   Smokeless tobacco: Never   Tobacco comments:    40 + years  Vaping Use   Vaping Use: Never used  Substance Use Topics   Alcohol use: Yes    Comment: social once every 3-4 months   Drug use: Not Currently    Types:  Marijuana    Comment: 03/30/18   Current Outpatient Medications  Medication Sig Dispense Refill   aspirin 81 MG tablet Take 81 mg by mouth daily.     Cholecalciferol (VITAMIN D-3) 1000 UNITS CAPS Take 1 capsule by mouth daily.     ferrous sulfate 325 (65 FE) MG tablet TAKE 1 TABLET (325 MG TOTAL) BY MOUTH 2 (TWICE) TIMES DAILY WITH MEALS. 180 tablet 1   hydrochlorothiazide (MICROZIDE) 12.5 MG capsule TAKE 1 CAPSULE BY MOUTH EVERY DAY 90 capsule 0   hydrocortisone 2.5 % ointment Apply topically 2 (two) times daily. 30 g 0   linaclotide (LINZESS) 145 MCG CAPS capsule Take 1 capsule (145 mcg total) by mouth daily before breakfast. 30 capsule 2    lisinopril (ZESTRIL) 10 MG tablet TAKE 1 TABLET BY MOUTH EVERY DAY 90 tablet 0   rosuvastatin (CRESTOR) 10 MG tablet TAKE 1 TABLET BY MOUTH EVERY DAY (Patient taking differently: TAKE 1 TABLET BY MOUTH ON Monday, Wednesday AND FRIDAYS) 90 tablet 1   sildenafil (VIAGRA) 100 MG tablet Take 100 mg by mouth daily as needed.     Vibegron (GEMTESA) 75 MG TABS Take by mouth.     No current facility-administered medications for this visit.   No Known Allergies   Review of Systems: All systems reviewed and negative except where noted in HPI.    No results found.  Physical Exam: BP 130/72 (BP Location: Left Arm, Patient Position: Sitting, Cuff Size: Normal)   Pulse 80   Ht 5' 6.5" (1.689 m)   Wt 255 lb (115.7 kg)   BMI 40.54 kg/m  Constitutional: Pleasant,well-developed, African-American male in no acute distress. HEENT: Normocephalic and atraumatic. Conjunctivae are normal. No scleral icterus. Cardiovascular: Normal rate, regular rhythm.  Pulmonary/chest: Effort normal and breath sounds normal. No wheezing, rales or rhonchi. Abdominal: Obese soft, nondistended, nontender. Bowel sounds active throughout. There are no masses palpable. No hepatomegaly. Extremities: no edema Neurological: Alert and oriented to person place and time. Skin: Skin is warm and dry. No rashes noted. Psychiatric: Normal mood and affect. Behavior is normal.  CBC    Component Value Date/Time   WBC 6.3 04/14/2021 1225   WBC 10.0 04/13/2018 0451   RBC 5.64 04/14/2021 1225   RBC 3.17 (L) 04/13/2018 0451   HGB 16.4 04/14/2021 1225   HCT 47.3 04/14/2021 1225   PLT 224 04/14/2021 1225   MCV 84 04/14/2021 1225   MCH 29.1 04/14/2021 1225   MCH 28.4 04/13/2018 0451   MCHC 34.7 04/14/2021 1225   MCHC 33.2 04/13/2018 0451   RDW 13.5 04/14/2021 1225   LYMPHSABS 2.1 03/31/2018 1338   MONOABS 0.5 03/31/2018 1338   EOSABS 0.3 03/31/2018 1338   BASOSABS 0.1 03/31/2018 1338    CMP     Component Value Date/Time    NA 139 04/14/2021 1225   K 4.1 04/14/2021 1225   CL 100 04/14/2021 1225   CO2 23 04/14/2021 1225   GLUCOSE 91 04/14/2021 1225   GLUCOSE 138 (H) 04/12/2018 0358   BUN 14 04/14/2021 1225   CREATININE 1.16 04/14/2021 1225   CALCIUM 10.2 04/14/2021 1225   PROT 7.8 04/14/2021 1225   ALBUMIN 5.2 (H) 04/14/2021 1225   AST 25 04/14/2021 1225   ALT 30 04/14/2021 1225   ALKPHOS 225 (H) 04/14/2021 1225   BILITOT 0.5 04/14/2021 1225   GFRNONAA 51 (L) 09/10/2020 1658   GFRAA 59 (L) 09/10/2020 1658     ASSESSMENT AND PLAN: 80 year old male with chronic isolated  alkaline phosphatase elevation, with normal GGT but with evidence of dilated common bile duct on ultrasound.  He does have a history of osteoarthritis and is status post bilateral total hip arthroplasties.  He has evidence of steatosis on ultrasound.  Given his normal GGT, it seems unlikely that his alkaline phosphatase elevation is hepatobiliary in origin, but with his dilated common bile duct and evidence of steatosis this cannot be discounted.  We will plan to fractionate his alk phos to see if the elevation is predominantly hepatic versus bony in etiology.  We will further evaluate his dilated common bile duct with an MRCP.  Will assess for autoimmune causes of chronic liver disease with anti-smooth muscle antibody and antimitochondrial antibody.  We will further assess for fibrosis with elastography.  We discussed the role of diet and exercise and fatty liver disease, and how this is the only effective therapy for it to reduce risk of progression to cirrhosis.  Specifically, we talked about the role of carbohydrates contributing to steatosis of the liver.  Patient admits that he does have a sweet tooth.  I recommended he meet with a dietitian, and he was agreeable to this.  Fatty liver - Elastography - Dietitian referral with focus on limiting carbohydrates/simple sugars - Patient to focus on diet and exercise for weight loss; recommended  patient lose 10% body weight - Continue to limit/avoid alcohol  Isolated alkaline phosphatase elevation  - Fractionated alk phos - MRCP - Anti-smooth muscle antibody, antimitochondrial antibody  Dated common bile duct on ultrasound - MRCP  Kevin Crispo E. Candis Schatz, MD [facility]   CC:  [name], FNP

## 2021-06-29 NOTE — Patient Instructions (Signed)
If you are age 78 or older, your body mass index should be between 23-30. Your Body mass index is 40.54 kg/m. If this is out of the aforementioned range listed, please consider follow up with your Primary Care Provider.  If you are age 45 or younger, your body mass index should be between 19-25. Your Body mass index is 40.54 kg/m. If this is out of the aformentioned range listed, please consider follow up with your Primary Care Provider.   We have sent a referral to nutrition for Fatty Liver.  Your provider has requested that you go to the basement level for lab work before leaving today. Press "B" on the elevator. The lab is located at the first door on the left as you exit the elevator.   You have been scheduled for an MRI at [facility] on 07/07/21 . Your appointment time is 8am. Please arrive to admitting (at main entrance of the hospital) (7:30 am) 30 minutes prior to your appointment time for registration purposes. Please make certain not to have anything to eat or drink 6 hours prior to your test. In addition, if you have any metal in your body, have a pacemaker or defibrillator, please be sure to let your ordering physician know. This test typically takes 45 minutes to 1 hour to complete. Should you need to reschedule, please call [phone] to do so.   You have been scheduled for an abdominal ultrasound with elastography at [facility] Radiology (1st floor). Your appointment is scheduled for 07/13/21 at 8 am. Please arrive 15 minutes prior to your scheduled appointment for registration purposes. Make certain not to have anything to eat or drink 8 hours prior to your procedure. Should you need to reschedule your appointment, you may contact radiology at [phone].  Liver Elastography Various chronic liver diseases such as hepatitis B, C, and fatty liver disease can lead to tissue damage and subsequent scar tissue formation. As the scar tissue accumulates, the liver loses some of its  elasticity and becomes stiffer. Liver elastography involves the use of a surface ultrasound probe that delivers a low frequency pulse or shear wave to a small volume of liver tissue under the rib cage. The transmission of the sound wave is completely painless. How Is a Liver Elastography Performed? The liver is located in the right upper abdomen under the rib cage. Patients are asked to lie flat on an examination table. A technician places the FibroScan probe between the ribs on the right side of the lower chest wall. A series of 10 painless pulses are then applied to the liver. The results are recorded on the equipment and an overall liver stiffness score is generated. This score is then interpreted by a qualified physician to predict the likelihood of advanced fibrosis or cirrhosis.  Patients are asked to wear loose clothing and should not consume any liquids or solids for a minimum of 4 hours before the test to increase the likelihood of obtaining reliable test results. The scan will take 10 to 15 minutes to complete, but patients should plan on being available for 30 minutes to allow time for preparation    The [place] [facility] providers would like to encourage you to use [facility] to communicate with providers for non-urgent requests or questions.  Due to long hold times on the telephone, sending your provider a message by [facility] may be a faster and more efficient way to get a response.  Please allow 48 business hours for a response.  Please remember that this is for non-urgent requests.   It was a pleasure to see you today!  Thank you for trusting me with your gastrointestinal care!    Scott E.Candis Schatz, MD

## 2021-07-03 LAB — ALKALINE PHOSPHATASE, ISOENZYMES
Alkaline Phosphatase: 222 IU/L — ABNORMAL HIGH (ref 44–121)
BONE FRACTION: 80 % — ABNORMAL HIGH (ref 12–68)
INTESTINAL FRAC.: 4 % (ref 0–18)
LIVER FRACTION: 16 % (ref 13–88)

## 2021-07-05 LAB — ANTI-SMOOTH MUSCLE ANTIBODY, IGG: Actin (Smooth Muscle) Antibody (IGG): <20 U (ref <20)

## 2021-07-05 LAB — IGG: IgG (Immunoglobin G), Serum: 1118 mg/dL (ref 600–1540)

## 2021-07-05 LAB — MITOCHONDRIAL ANTIBODIES: Mitochondrial M2 Ab, IgG: <=20 U (ref <=20.0)

## 2021-07-06 NOTE — Progress Notes (Signed)
Mr. Carn,  Your fractionated alkaline phosphatase levels showed that the abnormalities are bony in origin, not liver.  Sometimes this can be related to arthritis or other conditions affecting bone turnover, such as Paget's disease.  The other tests for chronic autoimmune liver disease were negative.  I will defer any other work up for bony conditions causing the alkaline phosphatase to your primary care provider.  Please proceed with the elastography and MRI as discussed.

## 2021-07-07 ENCOUNTER — Other Ambulatory Visit: Payer: Self-pay | Admitting: Gastroenterology

## 2021-07-07 ENCOUNTER — Other Ambulatory Visit: Payer: Self-pay | Admitting: Nurse Practitioner

## 2021-07-07 ENCOUNTER — Other Ambulatory Visit: Payer: Self-pay

## 2021-07-07 ENCOUNTER — Ambulatory Visit (HOSPITAL_COMMUNITY)
Admission: RE | Admit: 2021-07-07 | Discharge: 2021-07-07 | Disposition: A | Discharge status: Home / Self Care | Payer: Medicare HMO | Source: Ambulatory Visit | Attending: Gastroenterology | Admitting: Gastroenterology

## 2021-07-07 DIAGNOSIS — R748 Abnormal levels of other serum enzymes: Secondary | ICD-10-CM | POA: Diagnosis present

## 2021-07-07 DIAGNOSIS — K760 Fatty (change of) liver, not elsewhere classified: Secondary | ICD-10-CM | POA: Diagnosis present

## 2021-07-07 DIAGNOSIS — R932 Abnormal findings on diagnostic imaging of liver and biliary tract: Secondary | ICD-10-CM | POA: Diagnosis present

## 2021-07-07 IMAGING — MR MR 3D RECON AT [facility]
19 of 21 series · 43 of 48 positions shown · IV contrast (gadavist)
Comparison: Ultrasound on [DATE]

CLINICAL DATA: Elevated alkaline phosphatase level. Biliary ductal
dilatation on recent ultrasound.

EXAM:
MRI ABDOMEN WITHOUT AND WITH CONTRAST (INCLUDING MRCP)
TECHNIQUE: Multiplanar multisequence MR imaging of the abdomen was performed
both before and after the administration of intravenous contrast.
Heavily T2-weighted images of the biliary and pancreatic ducts were
obtained, and three-dimensional MRCP images were rendered by post
processing.
CONTRAST:  10mL GADAVIST GADOBUTROL 1 MMOL/ML IV SOLN

[Series 2: T2 fat-sat · axial · 6.0mm · 1.34mm/px · 1 of 36 slices shown]
[im 1/36]
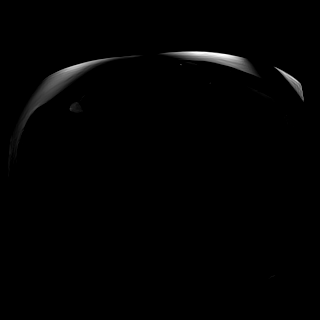

[Series 4: DWI · axial · 6.0mm · 1.60mm/px · z∈[-79,+195]mm · 2 of 78 slices shown (1 of 2)]
[im 1/78]
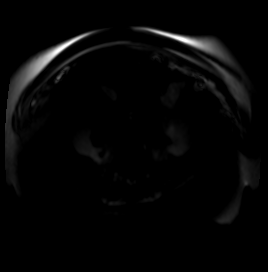
[im 78/78]
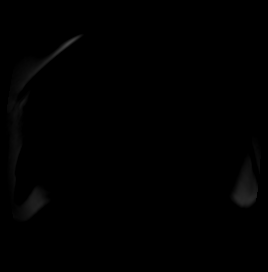

[Series 5: DWI · axial · 6.0mm · 1.60mm/px · 1 of 39 slices shown (2 of 2)]
[im 1/39]
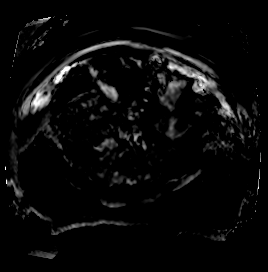

[Series 7: cor_3d_spc_trig · coronal · 1.0mm · 0.49mm/px · 2 of 72 slices shown]
[im 1/72]
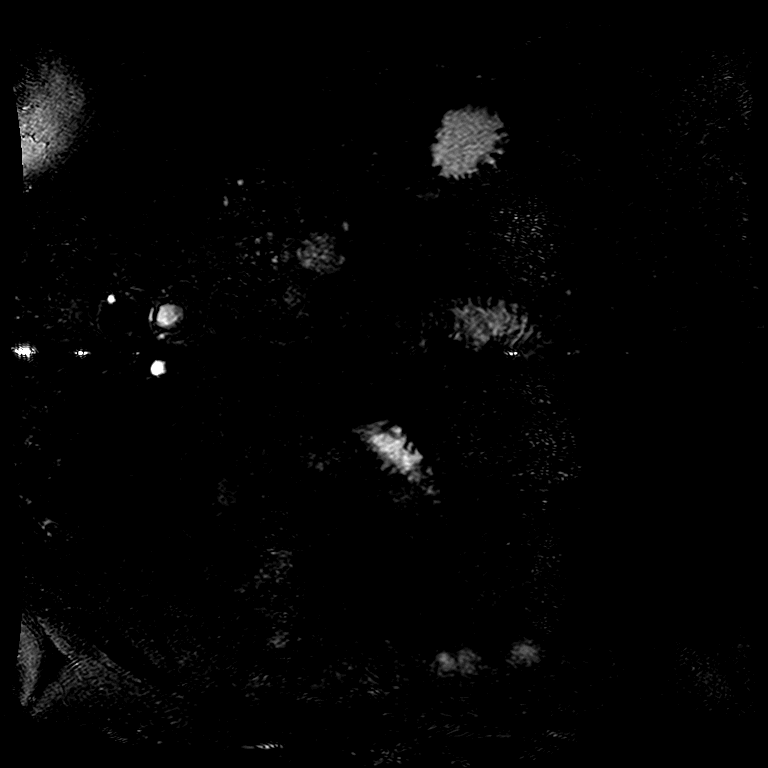
[im 72/72]
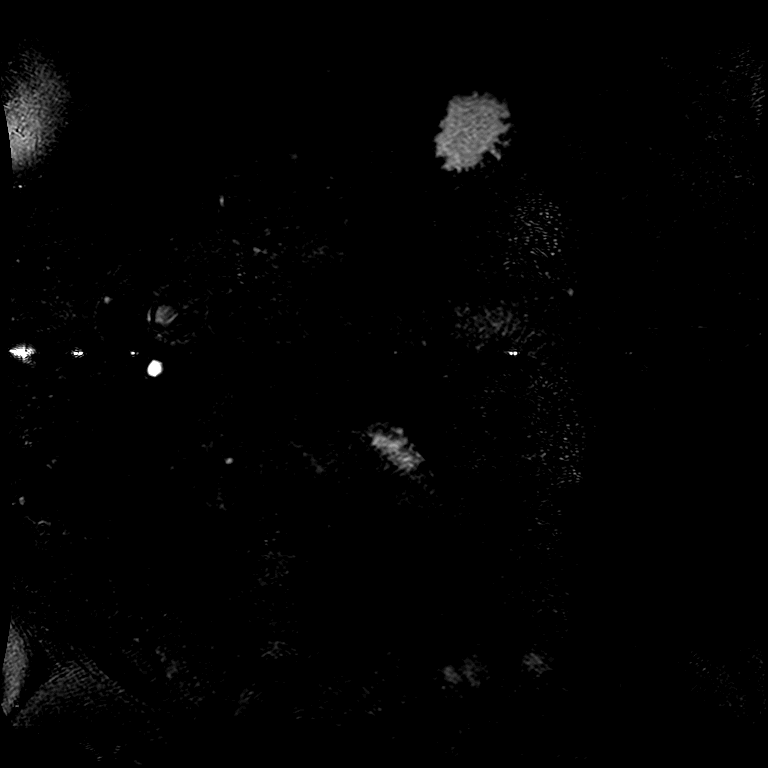

[Series 11: T2 · coronal · 6.0mm · 1.56mm/px · 2 of 49 slices shown (1 of 2)]
[im 1/49]
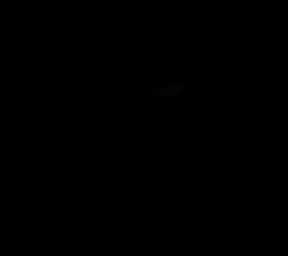
[im 49/49]
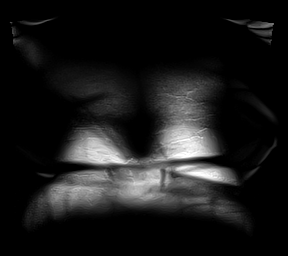

[Series 12: T1 · axial · 3.5mm · 1.34mm/px · z∈[-78,+199]mm · 3 of 80 slices shown (1 of 2)]
[im 1/80]
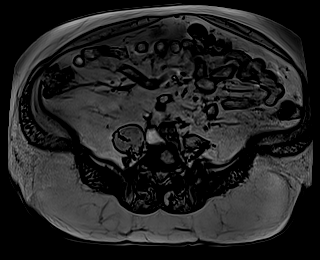
[im 40/80]
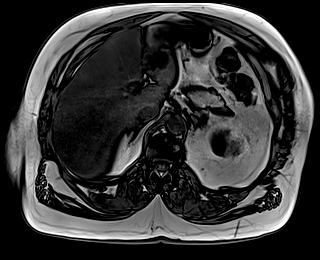
[im 80/80]
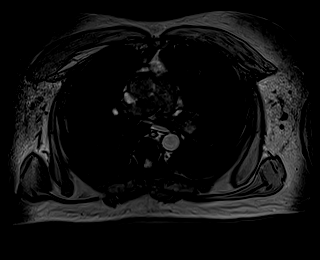

[Series 13: T1 · axial · 3.5mm · 1.34mm/px · z∈[-78,+199]mm · 3 of 80 slices shown (2 of 2)]
[im 1/80]
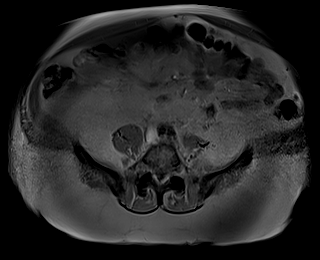
[im 40/80]
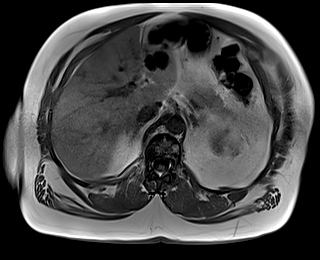
[im 80/80]
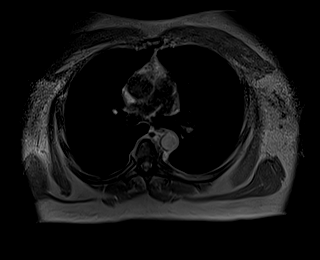

[Series 14: cor obl thk · sagittal · 50.0mm · 0.78mm/px · 1 of 9 slices shown]
[im 1/9]
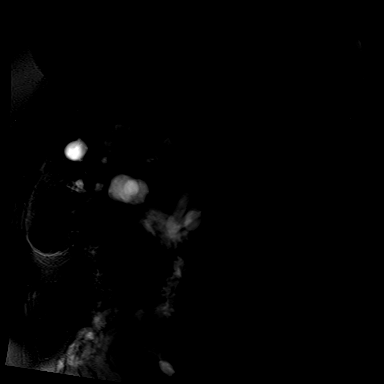

[Series 15: T2 · axial · 6.0mm · 1.68mm/px · 1 of 39 slices shown (2 of 2)]
[im 1/39]
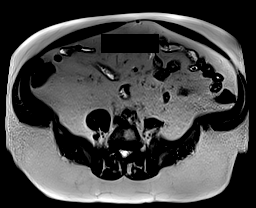

[Series 17: T1 dynamic · axial · 3.0mm · 1.34mm/px · z∈[-81,+204]mm · 3 of 96 slices shown (1 of 10)]
[im 1/96]
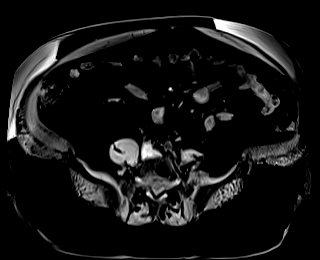
[im 48/96]
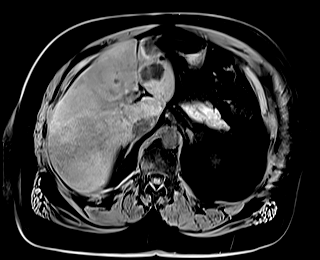
[im 96/96]
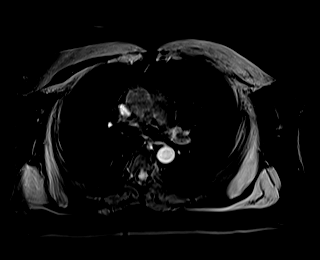

[Series 21: T1 dynamic · axial · 3.0mm · 1.34mm/px · z∈[-81,+204]mm · 3 of 96 slices shown (2 of 10)]
[im 1/96]
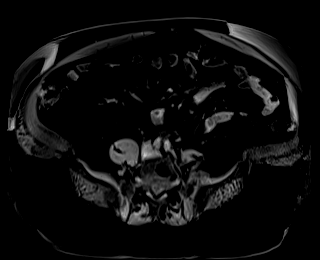
[im 48/96]
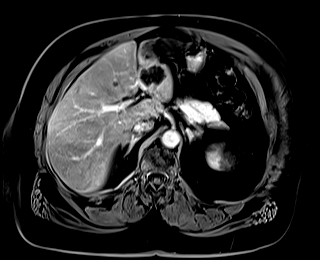
[im 96/96]
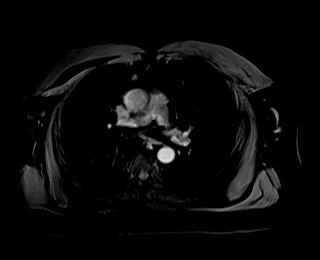

[Series 22: T1 dynamic · axial · 3.0mm · 1.34mm/px · z∈[-81,+204]mm · 3 of 96 slices shown (3 of 10)]
[im 1/96]
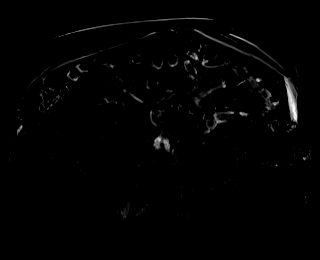
[im 48/96]
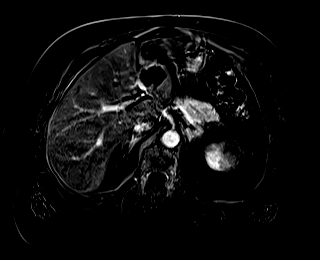
[im 96/96]
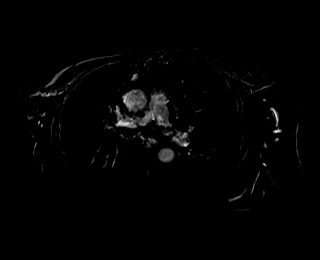

[Series 25: T1 dynamic · axial · 3.0mm · 1.34mm/px · z∈[-81,+204]mm · 3 of 96 slices shown (4 of 10)]
[im 1/96]
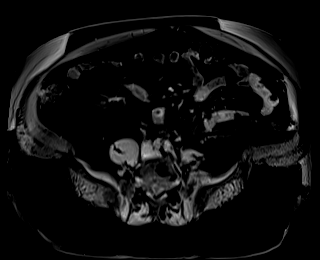
[im 48/96]
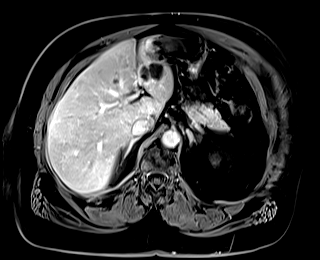
[im 96/96]
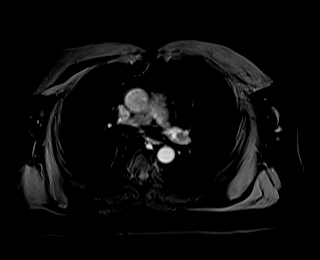

[Series 26: T1 dynamic · axial · 3.0mm · 1.34mm/px · z∈[-81,+204]mm · 3 of 96 slices shown (5 of 10)]
[im 1/96]
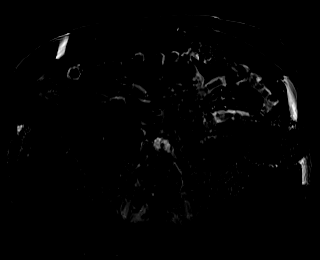
[im 48/96]
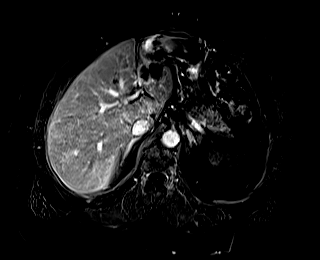
[im 96/96]
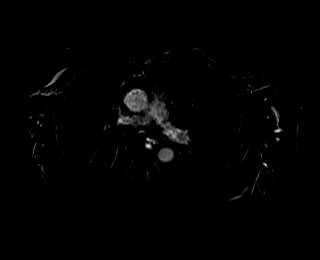

[Series 30: T1 dynamic · axial · 3.0mm · 1.34mm/px · z∈[-81,+204]mm · 3 of 96 slices shown (6 of 10)]
[im 1/96]
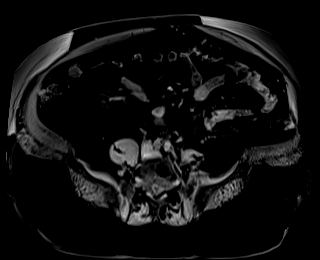
[im 48/96]
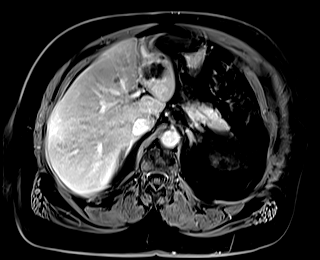
[im 96/96]
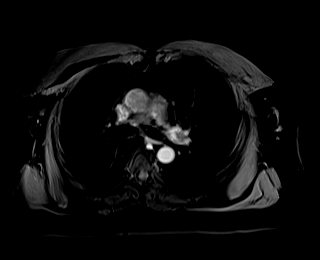

[Series 31: T1 dynamic · axial · 3.0mm · 1.34mm/px · z∈[-81,+204]mm · 3 of 96 slices shown (7 of 10)]
[im 1/96]
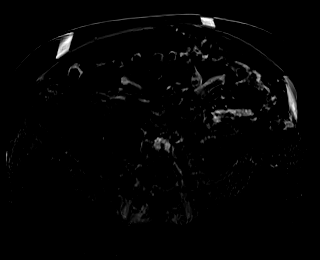
[im 48/96]
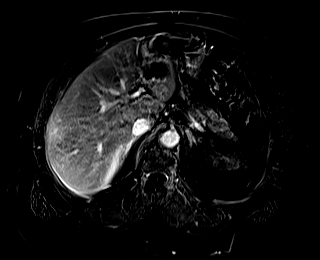
[im 96/96]
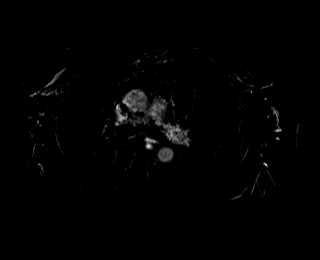

[Series 33: T1 dynamic · coronal · 5.0mm · 1.41mm/px · 2 of 60 slices shown (8 of 10)]
[im 1/60]
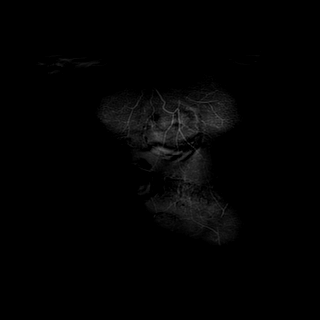
[im 60/60]
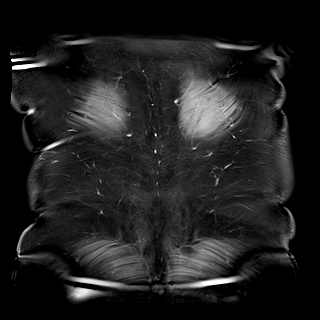

[Series 36: T1 dynamic · axial · 3.0mm · 1.34mm/px · z∈[-81,+204]mm · 3 of 96 slices shown (9 of 10)]
[im 1/96]
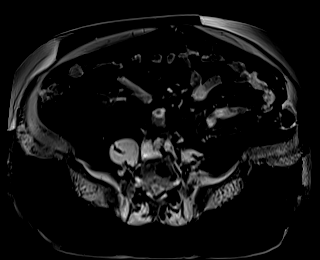
[im 48/96]
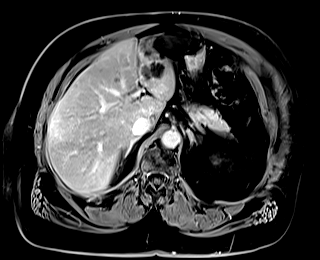
[im 96/96]
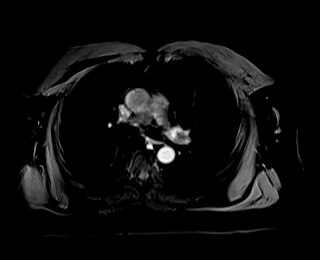

[Series 37: T1 dynamic · axial · 3.0mm · 1.34mm/px · 1 of 96 slices shown (10 of 10)]
[im 1/96]
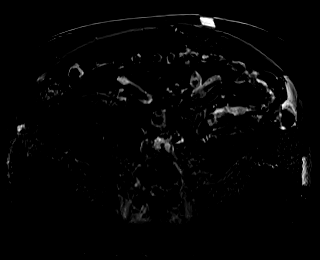

[43 of 48 positions shown; findings below may reference images not displayed]

FINDINGS: Lower chest: No acute findings.

Hepatobiliary: Numerous small cysts are seen throughout the liver.
No hepatic masses identified. Mild-to-moderate diffuse hepatic
steatosis noted. Gallbladder is unremarkable. Common bile duct
measures 7 mm in diameter, which is within normal limits for patient
age.

Pancreas: No mass or inflammatory changes. No evidence of pancreatic
ductal dilatation or pancreas divisum.

Spleen:  Within normal limits in size and appearance.

Adrenals/Urinary Tract: No masses identified. Small renal cysts
noted bilaterally. No evidence of hydronephrosis.

Stomach/Bowel: Visualized portion unremarkable.

Vascular/Lymphatic: No pathologically enlarged lymph nodes
identified. No acute vascular findings.

Other:  None.

Musculoskeletal:  No suspicious bone lesions identified.
IMPRESSION: No evidence of biliary ductal dilatation, choledocholithiasis, or
other acute findings.

Mild-to-moderate hepatic steatosis.

Benign-appearing hepatic and renal cysts.

## 2021-07-07 IMAGING — MR MR ABDOMEN WO/W CM MRCP
19 of 21 series · 43 of 48 positions shown · IV contrast (gadavist)
Comparison: Ultrasound on [DATE]

CLINICAL DATA: Elevated alkaline phosphatase level. Biliary ductal
dilatation on recent ultrasound.

EXAM:
MRI ABDOMEN WITHOUT AND WITH CONTRAST (INCLUDING MRCP)
TECHNIQUE: Multiplanar multisequence MR imaging of the abdomen was performed
both before and after the administration of intravenous contrast.
Heavily T2-weighted images of the biliary and pancreatic ducts were
obtained, and three-dimensional MRCP images were rendered by post
processing.
CONTRAST:  10mL GADAVIST GADOBUTROL 1 MMOL/ML IV SOLN

[Series 2: T2 fat-sat · axial · 6.0mm · 1.34mm/px · 1 of 36 slices shown]
[im 1/36]
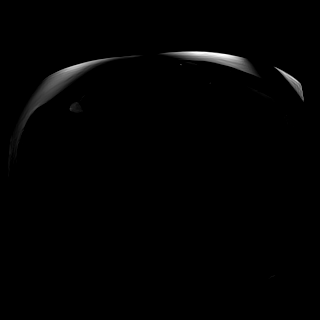

[Series 4: DWI · axial · 6.0mm · 1.60mm/px · z∈[-79,+195]mm · 2 of 78 slices shown (1 of 2)]
[im 1/78]
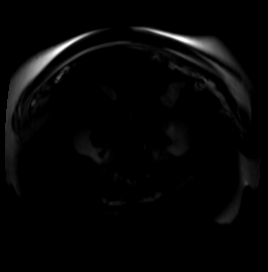
[im 78/78]
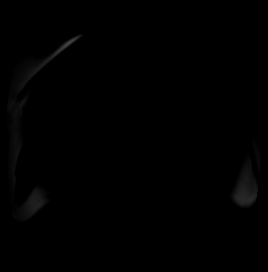

[Series 5: DWI · axial · 6.0mm · 1.60mm/px · 1 of 39 slices shown (2 of 2)]
[im 1/39]
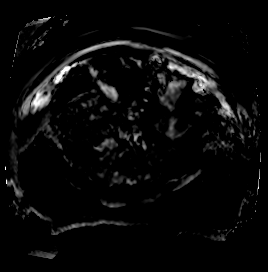

[Series 7: cor_3d_spc_trig · coronal · 1.0mm · 0.49mm/px · 2 of 72 slices shown]
[im 1/72]
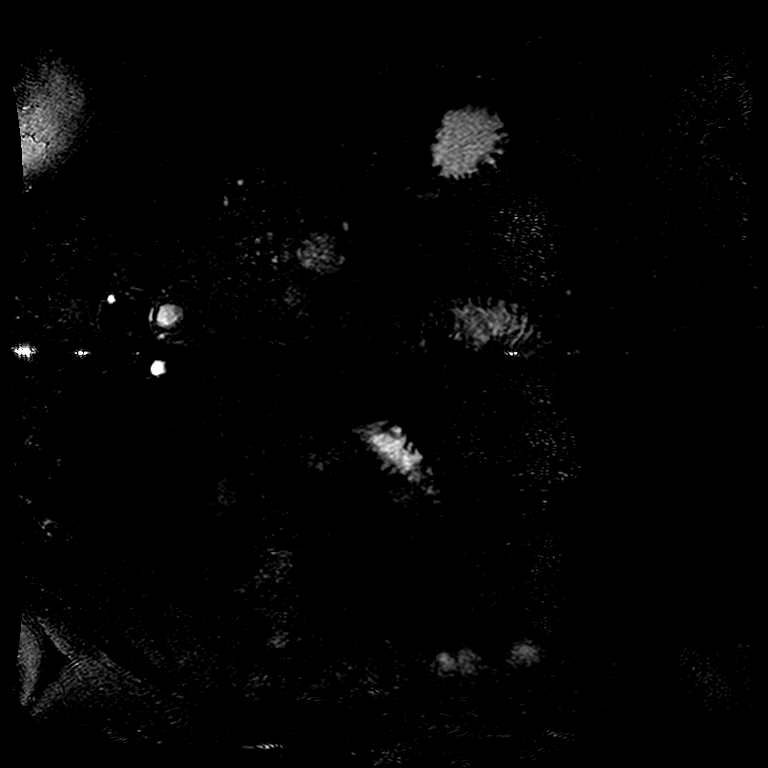
[im 72/72]
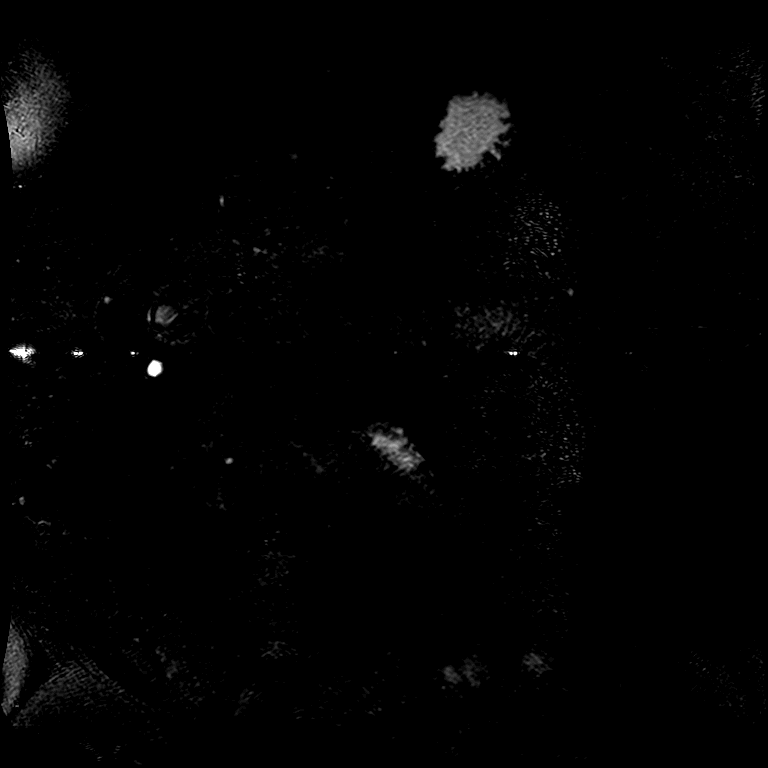

[Series 11: T2 · coronal · 6.0mm · 1.56mm/px · 2 of 49 slices shown (1 of 2)]
[im 1/49]
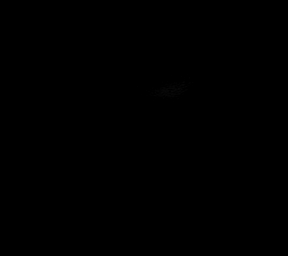
[im 49/49]
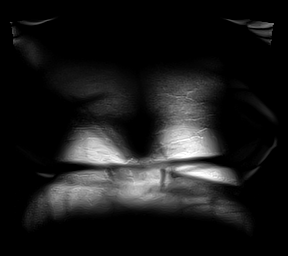

[Series 12: T1 · axial · 3.5mm · 1.34mm/px · z∈[-78,+199]mm · 3 of 80 slices shown (1 of 2)]
[im 1/80]
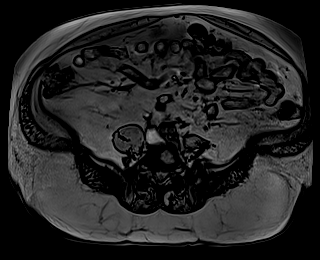
[im 40/80]
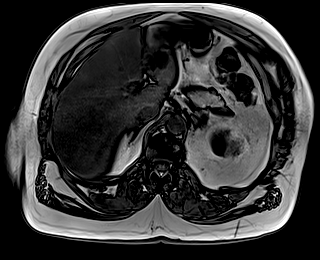
[im 80/80]
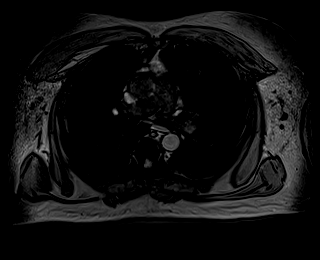

[Series 13: T1 · axial · 3.5mm · 1.34mm/px · z∈[-78,+199]mm · 3 of 80 slices shown (2 of 2)]
[im 1/80]
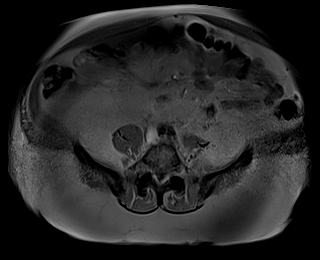
[im 40/80]
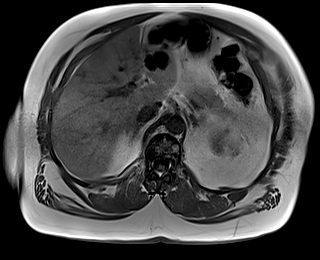
[im 80/80]
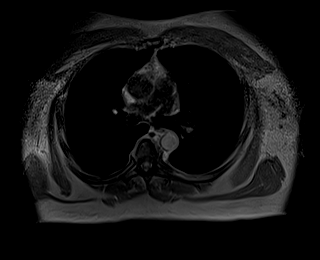

[Series 14: cor obl thk · sagittal · 50.0mm · 0.78mm/px · 1 of 9 slices shown]
[im 1/9]
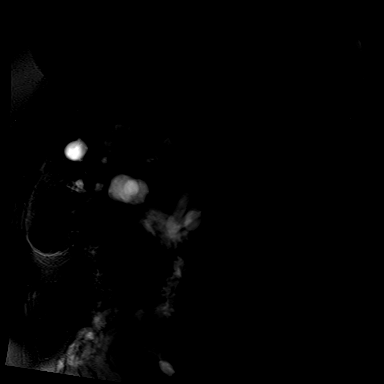

[Series 15: T2 · axial · 6.0mm · 1.68mm/px · 1 of 39 slices shown (2 of 2)]
[im 1/39]
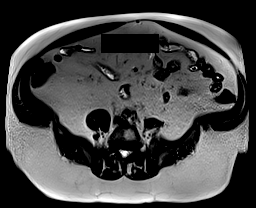

[Series 17: T1 dynamic · axial · 3.0mm · 1.34mm/px · z∈[-81,+204]mm · 3 of 94 slices shown (1 of 10)]
[im 1/94]
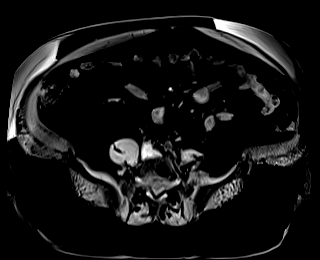
[im 47/94]
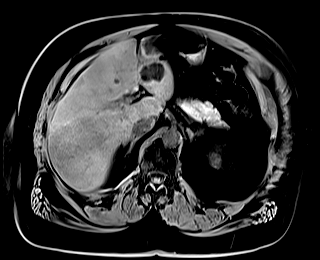
[im 94/94]
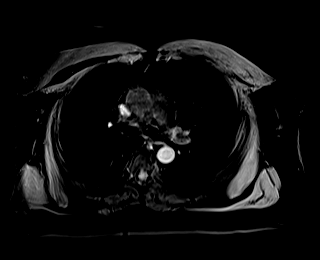

[Series 21: T1 dynamic · axial · 3.0mm · 1.34mm/px · z∈[-81,+204]mm · 3 of 96 slices shown (2 of 10)]
[im 1/96]
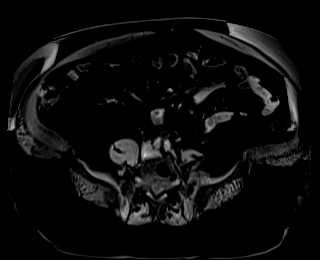
[im 48/96]
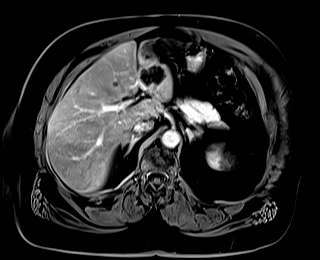
[im 96/96]
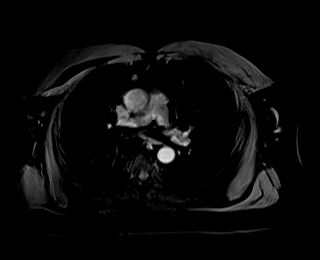

[Series 22: T1 dynamic · axial · 3.0mm · 1.34mm/px · z∈[-81,+204]mm · 3 of 93 slices shown (3 of 10)]
[im 1/93]
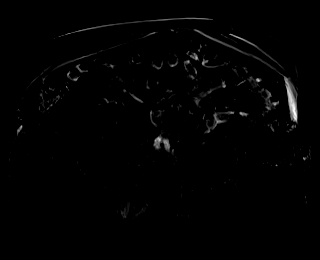
[im 47/93]
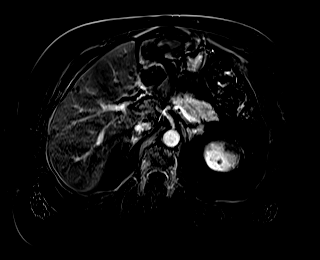
[im 93/93]
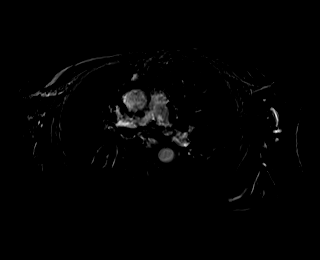

[Series 25: T1 dynamic · axial · 3.0mm · 1.34mm/px · z∈[-81,+204]mm · 3 of 96 slices shown (4 of 10)]
[im 1/96]
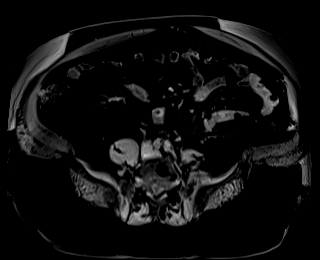
[im 48/96]
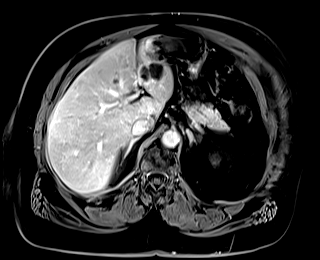
[im 96/96]
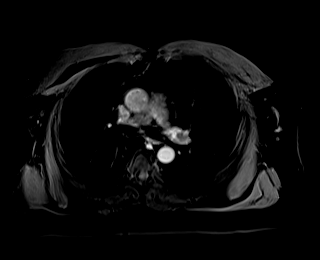

[Series 26: T1 dynamic · axial · 3.0mm · 1.34mm/px · z∈[-81,+204]mm · 3 of 96 slices shown (5 of 10)]
[im 1/96]
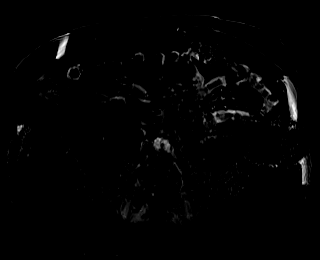
[im 48/96]
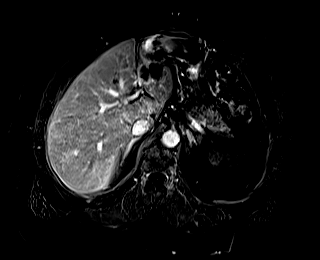
[im 96/96]
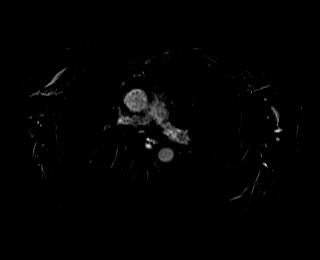

[Series 30: T1 dynamic · axial · 3.0mm · 1.34mm/px · z∈[-81,+204]mm · 3 of 95 slices shown (6 of 10)]
[im 1/95]
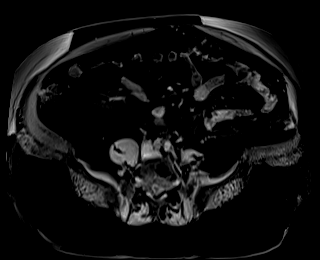
[im 48/95]
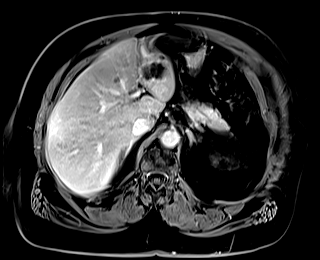
[im 95/95]
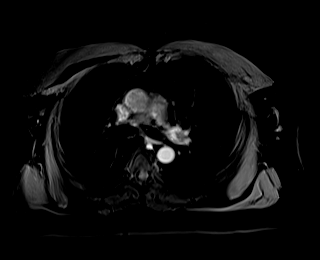

[Series 31: T1 dynamic · axial · 3.0mm · 1.34mm/px · z∈[-81,+204]mm · 3 of 96 slices shown (7 of 10)]
[im 1/96]
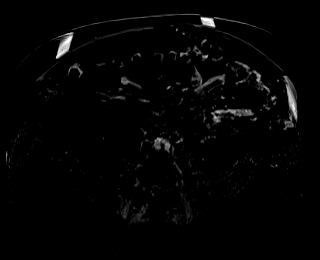
[im 48/96]
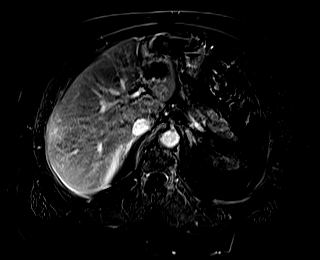
[im 96/96]
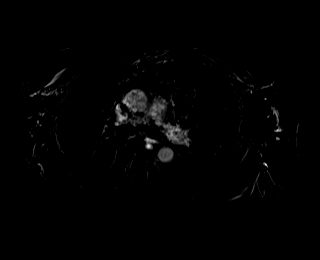

[Series 33: T1 dynamic · coronal · 5.0mm · 1.41mm/px · 2 of 60 slices shown (8 of 10)]
[im 1/60]
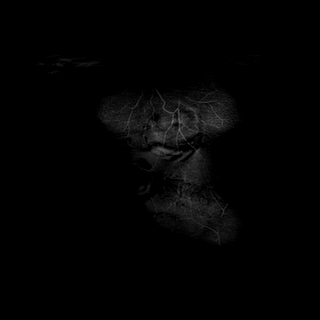
[im 60/60]
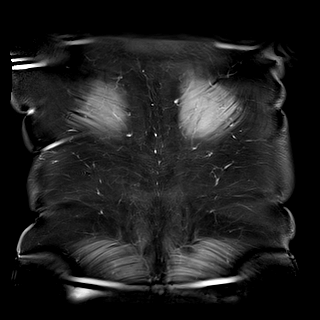

[Series 36: T1 dynamic · axial · 3.0mm · 1.34mm/px · z∈[-81,+204]mm · 3 of 96 slices shown (9 of 10)]
[im 1/96]
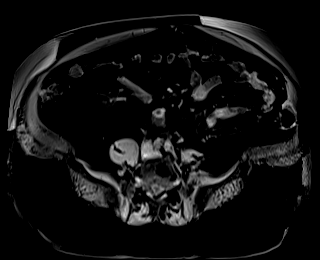
[im 48/96]
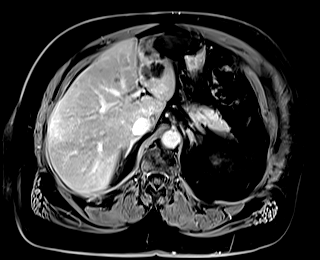
[im 96/96]
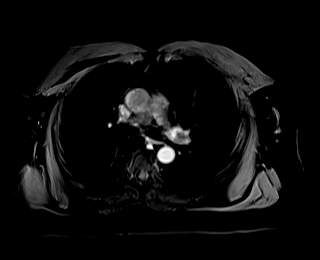

[Series 37: T1 dynamic · axial · 3.0mm · 1.34mm/px · 1 of 96 slices shown (10 of 10)]
[im 1/96]
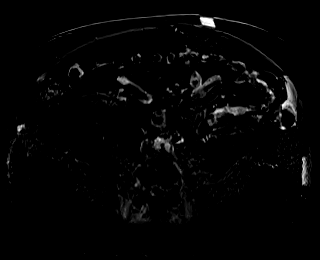

[43 of 48 positions shown; findings below may reference images not displayed]

FINDINGS: Lower chest: No acute findings.

Hepatobiliary: Numerous small cysts are seen throughout the liver.
No hepatic masses identified. Mild-to-moderate diffuse hepatic
steatosis noted. Gallbladder is unremarkable. Common bile duct
measures 7 mm in diameter, which is within normal limits for patient
age.

Pancreas: No mass or inflammatory changes. No evidence of pancreatic
ductal dilatation or pancreas divisum.

Spleen:  Within normal limits in size and appearance.

Adrenals/Urinary Tract: No masses identified. Small renal cysts
noted bilaterally. No evidence of hydronephrosis.

Stomach/Bowel: Visualized portion unremarkable.

Vascular/Lymphatic: No pathologically enlarged lymph nodes
identified. No acute vascular findings.

Other:  None.

Musculoskeletal:  No suspicious bone lesions identified.
IMPRESSION: No evidence of biliary ductal dilatation, choledocholithiasis, or
other acute findings.

Mild-to-moderate hepatic steatosis.

Benign-appearing hepatic and renal cysts.

## 2021-07-07 MED ORDER — GADOBUTROL 1 MMOL/ML IV SOLN
10.0000 mL | Freq: Once | INTRAVENOUS | Status: AC | PRN
  Administered 2021-07-07: 10 mL via INTRAVENOUS

## 2021-07-08 ENCOUNTER — Ambulatory Visit (INDEPENDENT_AMBULATORY_CARE_PROVIDER_SITE_OTHER): Payer: Medicare HMO

## 2021-07-08 ENCOUNTER — Telehealth: Payer: Medicare HMO

## 2021-07-08 DIAGNOSIS — D508 Other iron deficiency anemias: Secondary | ICD-10-CM

## 2021-07-08 DIAGNOSIS — E782 Mixed hyperlipidemia: Secondary | ICD-10-CM

## 2021-07-08 DIAGNOSIS — K5900 Constipation, unspecified: Secondary | ICD-10-CM

## 2021-07-08 DIAGNOSIS — R7303 Prediabetes: Secondary | ICD-10-CM

## 2021-07-08 DIAGNOSIS — R748 Abnormal levels of other serum enzymes: Secondary | ICD-10-CM

## 2021-07-08 DIAGNOSIS — I10 Essential (primary) hypertension: Secondary | ICD-10-CM

## 2021-07-08 NOTE — Chronic Care Management (AMB) (Signed)
Chronic Care Management   CCM RN Visit Note  07/08/2021 Name: Kevin Weiss MRN: [id number] DOB: 04/28/1941  Subjective: Kevin Weiss is a 80 y.o. year old male who is a primary care patient of [name], [place]. The care management team was consulted for assistance with disease management and care coordination needs.    Engaged with patient by telephone for follow up visit in response to provider referral for case management and/or care coordination services.   Consent to Services:  The patient was given information about Chronic Care Management services, agreed to services, and gave verbal consent prior to initiation of services.  Please see initial visit note for detailed documentation.   Patient agreed to services and verbal consent obtained.   Assessment: Review of patient past medical history, allergies, medications, health status, including review of consultants reports, laboratory and other test data, was performed as part of comprehensive evaluation and provision of chronic care management services.   SDOH (Social Determinants of Health) assessments and interventions performed:  yes, no acute challenges  CCM Care Plan  No Known Allergies  Outpatient Encounter Medications as of 07/08/2021  Medication Sig   lisinopril (ZESTRIL) 10 MG tablet TAKE 1 TABLET BY MOUTH EVERY DAY   aspirin 81 MG tablet Take 81 mg by mouth daily.   Cholecalciferol (VITAMIN D-3) 1000 UNITS CAPS Take 1 capsule by mouth daily.   ferrous sulfate 325 (65 FE) MG tablet TAKE 1 TABLET (325 MG TOTAL) BY MOUTH 2 (TWICE) TIMES DAILY WITH MEALS.   hydrochlorothiazide (MICROZIDE) 12.5 MG capsule TAKE 1 CAPSULE BY MOUTH EVERY DAY   hydrocortisone 2.5 % ointment Apply topically 2 (two) times daily.   linaclotide (LINZESS) 145 MCG CAPS capsule Take 1 capsule (145 mcg total) by mouth daily before breakfast.   rosuvastatin (CRESTOR) 10 MG tablet TAKE 1 TABLET BY MOUTH EVERY DAY (Patient taking differently:  TAKE 1 TABLET BY MOUTH ON Monday, Wednesday AND FRIDAYS)   sildenafil (VIAGRA) 100 MG tablet Take 100 mg by mouth daily as needed.   Vibegron (GEMTESA) 75 MG TABS Take by mouth.   No facility-administered encounter medications on file as of 07/08/2021.    Patient Active Problem List   Diagnosis Date Noted   Prediabetes 02/02/2019   Primary osteoarthritis of left hip 04/11/2018   Preop cardiovascular exam 03/30/2018   Abnormal echocardiogram 03/30/2018   Obese abdomen 07/22/2016   Retrognathia 11/03/2015   Morbid obesity ([place]) 11/03/2015   Cardiac hypertrophy 11/03/2015   OSA on CPAP 11/03/2015   Degenerative joint disease (DJD) of hip 03/12/2014   OBESITY, NOS 10/13/2006   HYPERTENSION, BENIGN SYSTEMIC 10/13/2006   CHF, EJECTION FRACTION > OR = 50% 10/13/2006   CARDIOMEGALY 10/13/2006   HEMORRHOIDS, NOS 10/13/2006   GASTROESOPHAGEAL REFLUX, NO ESOPHAGITIS 10/13/2006   IMPOTENCE, 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   Sleep apnea 10/13/2006    Conditions to be addressed/monitored: Prediabetes, HTN, Mixed Hyperlipidemia, Constipation, unspecified, Iron deficiency Anemia, Elevated alkaline phosphatase  Care Plan : Abnormal Abdominal Ultrasoud  Updates made by Lynne Logan, RN since 07/08/2021 12:00 AM     Problem: Abnormal Abdominal Ultrasound   Priority: High     Goal: Abnormal Abdominal US evaluated and treated   Start Date: 05/26/2021  Expected End Date: 08/26/2021  Recent Progress: On track  Priority: High  Note:   Current Barriers:  Ineffective Self Health Maintenance in a patient with  Prediabetes, HTN, Mixed Hyperlipidemia, Constipation, unspecified, Iron deficiency Anemia  Clinical Goal(s):  Collaboration with [name], FNP  regarding development and update of comprehensive plan of care as evidenced by provider attestation and co-signature Inter-disciplinary care team collaboration (see longitudinal plan of care) patient will work with care management team to address care  coordination and chronic disease management needs related to Disease Management Educational Needs Care Coordination Medication Management and Education Medication Reconciliation Psychosocial Support   Interventions:  07/08/21 completed inbound call from patient  Evaluation of current treatment plan related to  Prediabetes, HTN, Mixed Hyperlipidemia, Constipation, unspecified, Iron deficiency Anemia  ,  self-management and patient's adherence to plan as established by provider. Collaboration with [name], FNP regarding development and update of comprehensive plan of care as evidenced by provider attestation       and co-signature Inter-disciplinary care team collaboration (see longitudinal plan of care) Determined patient received my chart results from a recent Abdominal MRI and has questions  Determined Dr. Dustin Flock, Gastroenterologist has not yet reviewed the imaging completed on 07/07/21 Advised patient he should wait to hear from GI office, or contact this provider to inquire about his MRI results, patient verbalizes understanding Reviewed scheduled/upcoming provider appointments including:  US ABDOMEN COMPLETE W/ELASTOGRAPHY  scheduled for 07/13/21 @8  AM at [facility] outpatient radiology Updated patient's immunization record for [place] 19 booster and #2 Shingles vaccines Discussed plans with patient for ongoing care management follow up and provided patient with direct contact information for care management team Self Care Activities:  Self administers medications as prescribed Attends all scheduled provider appointments Calls pharmacy for medication refills Calls provider office for new concerns or questions Patient Goals: - follow up with GI Specialist as directed - report any/all GI symptoms as discussed to PCP promptly for further evaluation   Follow Up Plan: Telephone follow up appointment with care management team member scheduled for: 07/16/21      Plan:Telephone follow up appointment with care management team member scheduled for:  07/16/21  Barb Merino, RN, BSN, CCM Care Management Coordinator [place] [facility]  Direct Phone: [phone]

## 2021-07-08 NOTE — Patient Instructions (Addendum)
Visit Information   Thank you for taking time to visit with me today. Please don't hesitate to contact me if I can be of assistance to you before our next scheduled telephone appointment.  Following are the goals we discussed today:  Self Care Activities:  Self administers medications as prescribed Attends all scheduled provider appointments Calls pharmacy for medication refills Calls provider office for new concerns or questions Patient Goals: - follow up with GI Specialist as directed - report any/all GI symptoms as discussed to PCP promptly for further evaluation   Our next appointment is by telephone on 07/16/21 at 12 noon  Please call the care guide team at [phone] if you need to cancel or reschedule your appointment.   Following is a copy of your full care plan:  Care Plan : Abnormal Abdominal Ultrasoud  Updates made by Kevin Logan, RN since 07/08/2021 12:00 AM     Problem: Abnormal Abdominal Ultrasound   Priority: High     Goal: Abnormal Abdominal US evaluated and treated   Start Date: 05/26/2021  Expected End Date: 08/26/2021  Recent Progress: On track  Priority: High  Note:   Current Barriers:  Ineffective Self Health Maintenance in a patient with  Prediabetes, HTN, Mixed Hyperlipidemia, Constipation, unspecified, Iron deficiency Anemia  Clinical Goal(s):  Collaboration with [name], FNP regarding development and update of comprehensive plan of care as evidenced by provider attestation and co-signature Inter-disciplinary care team collaboration (see longitudinal plan of care) patient will work with care management team to address care coordination and chronic disease management needs related to Disease Management Educational Needs Care Coordination Medication Management and Education Medication Reconciliation Psychosocial Support   Interventions:  07/08/21 completed inbound call from patient  Evaluation of current treatment plan related to   Prediabetes, HTN, Mixed Hyperlipidemia, Constipation, unspecified, Iron deficiency Anemia  ,  self-management and patient's adherence to plan as established by provider. Collaboration with [name], FNP regarding development and update of comprehensive plan of care as evidenced by provider attestation       and co-signature Inter-disciplinary care team collaboration (see longitudinal plan of care) Determined patient received my chart results from a recent Abdominal MRI and has questions  Determined Dr. Dustin Flock, Gastroenterologist has not yet reviewed the imaging completed on 07/07/21 Advised patient he should wait to hear from GI office, or contact this provider to inquire about his MRI results, patient verbalizes understanding Reviewed scheduled/upcoming provider appointments including:  US ABDOMEN COMPLETE W/ELASTOGRAPHY  scheduled for 07/13/21 _0  AM at [facility] outpatient radiology Updated patient's immunization record for [place] 19 booster and #2 Shingles vaccines Discussed plans with patient for ongoing care management follow up and provided patient with direct contact information for care management team Self Care Activities:  Self administers medications as prescribed Attends all scheduled provider appointments Calls pharmacy for medication refills Calls provider office for new concerns or questions Patient Goals: - follow up with GI Specialist as directed - report any/all GI symptoms as discussed to PCP promptly for further evaluation   Follow Up Plan: Telephone follow up appointment with care management team member scheduled for: 07/16/21      Consent to CCM Services: Kevin Weiss was given information about Chronic Care Management services including:  CCM service includes personalized support from designated clinical staff supervised by his physician, including individualized plan of care and coordination with other care providers 24/7 contact phone numbers for  assistance for urgent and routine care needs. Service will only be billed  when office clinical staff spend 20 minutes or more in a month to coordinate care. Only one practitioner may furnish and bill the service in a calendar month. The patient may stop CCM services at any time (effective at the end of the month) by phone call to the office staff. The patient will be responsible for cost sharing (co-pay) of up to 20% of the service fee (after annual deductible is met).  Patient agreed to services and verbal consent obtained.   Patient verbalizes understanding of instructions provided today and agrees to view in [place].   Telephone follow up appointment with care management team member scheduled for: 07/16/21

## 2021-07-10 ENCOUNTER — Other Ambulatory Visit: Payer: Self-pay | Admitting: Nurse Practitioner

## 2021-07-10 DIAGNOSIS — I10 Essential (primary) hypertension: Secondary | ICD-10-CM

## 2021-07-13 ENCOUNTER — Ambulatory Visit (HOSPITAL_COMMUNITY)
Admission: RE | Admit: 2021-07-13 | Discharge: 2021-07-13 | Disposition: A | Discharge status: Home / Self Care | Payer: Medicare HMO | Source: Ambulatory Visit | Attending: Gastroenterology | Admitting: Gastroenterology

## 2021-07-13 ENCOUNTER — Other Ambulatory Visit: Payer: Self-pay

## 2021-07-13 DIAGNOSIS — K760 Fatty (change of) liver, not elsewhere classified: Secondary | ICD-10-CM | POA: Insufficient documentation

## 2021-07-13 DIAGNOSIS — R932 Abnormal findings on diagnostic imaging of liver and biliary tract: Secondary | ICD-10-CM | POA: Diagnosis present

## 2021-07-13 DIAGNOSIS — R748 Abnormal levels of other serum enzymes: Secondary | ICD-10-CM | POA: Diagnosis not present

## 2021-07-13 IMAGING — US US ABDOMEN COMPLETE W/ ELASTOGRAPHY
1 series · 12 of 25 positions shown · non-contrast
Comparison: MRI abdomen dated [DATE]

CLINICAL DATA: Fatty liver

EXAM:
ULTRASOUND ABDOMEN
ULTRASOUND HEPATIC ELASTOGRAPHY
TECHNIQUE: Sonography of the upper abdomen was performed. In addition,
ultrasound elastography evaluation of the liver was performed. A
region of interest was placed within the right lobe of the liver.
Following application of a compressive sonographic pulse, tissue
compressibility was assessed. Multiple assessments were performed at
the selected site. Median tissue compressibility was determined.
Previously, hepatic stiffness was assessed by shear wave velocity.
Based on recently published Society of Radiologists in Ultrasound
consensus article, reporting is now recommended to be performed in
the SI units of pressure (kiloPascals) representing hepatic
stiffness/elasticity. The obtained result is compared to the
published reference standards. (cACLD = compensated Advanced Chronic
Liver Disease)

[Series 1: us abdomen complete w/ elastography · 12 of 166 slices shown]
[im 7/166]
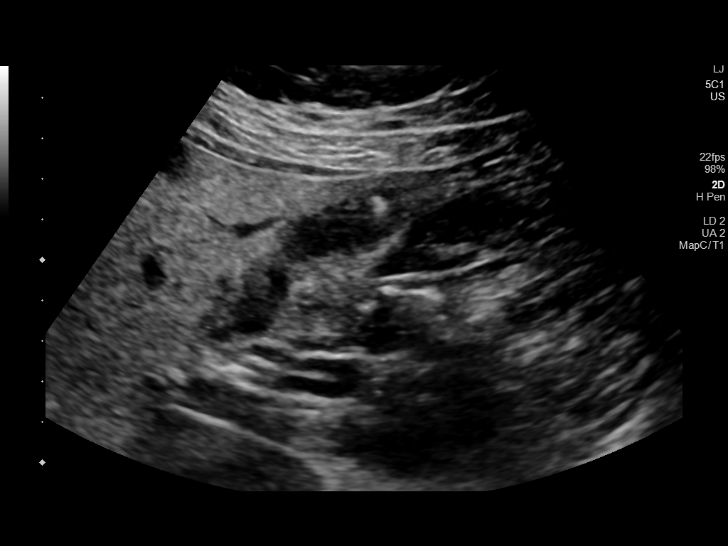
[im 21/166]
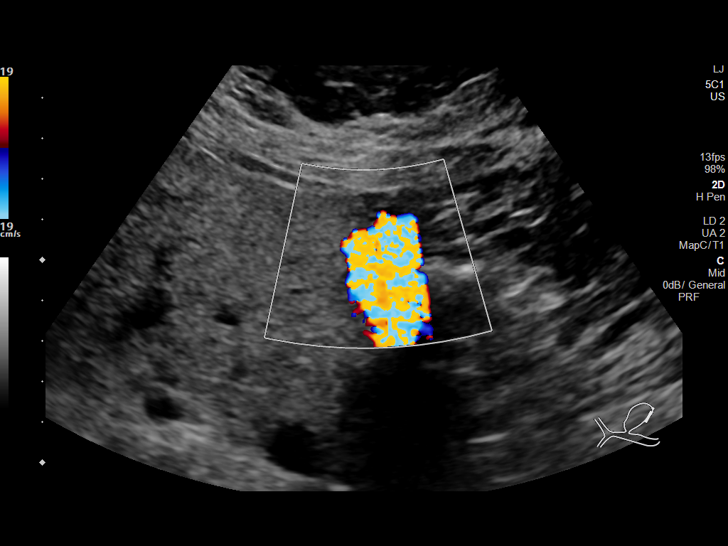
[im 35/166]
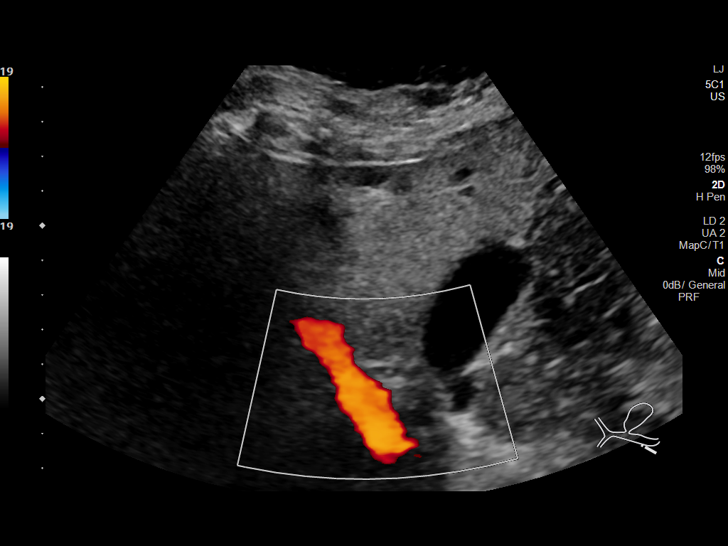
[im 49/166]
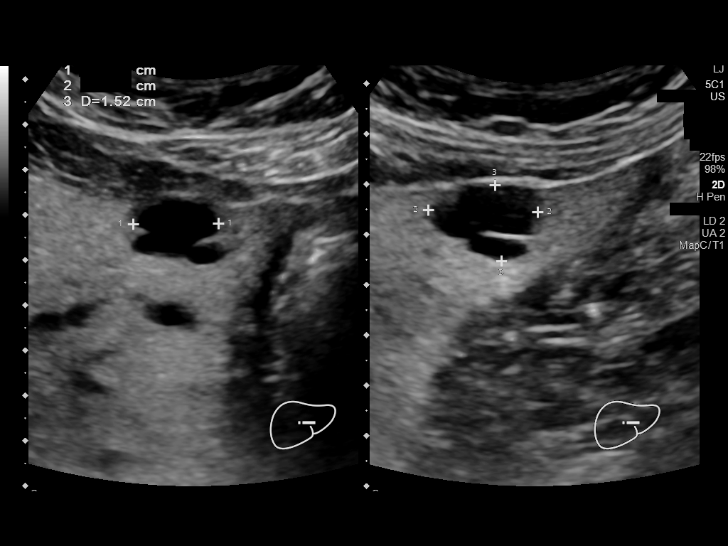
[im 62/166]
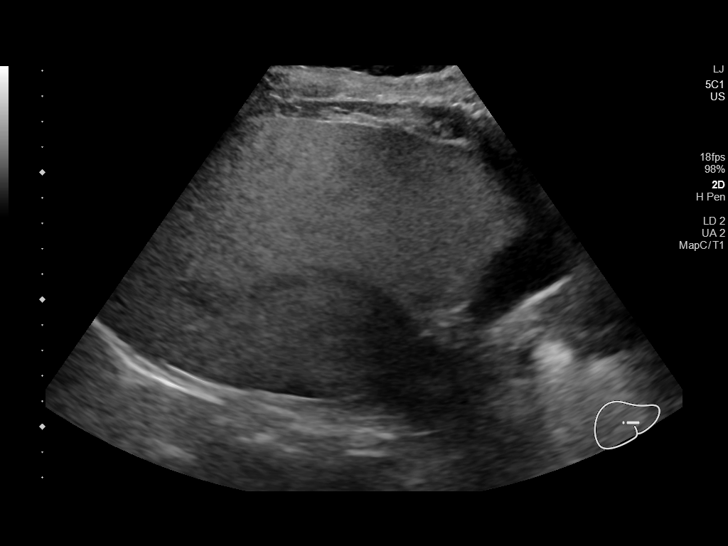
[im 76/166]
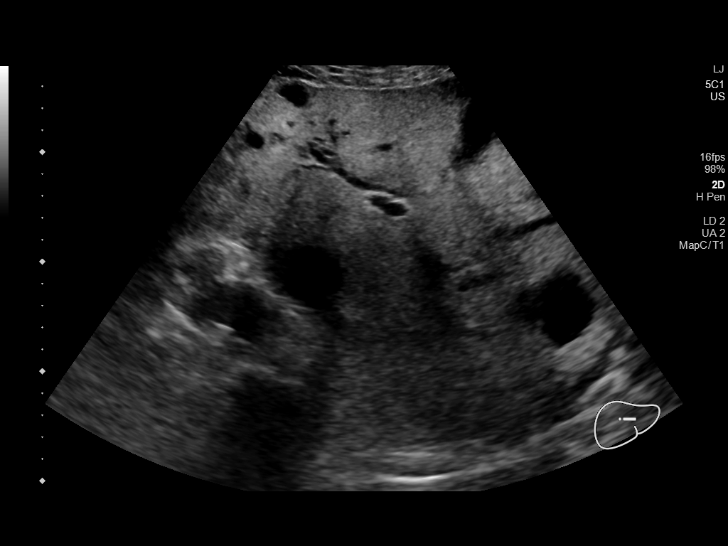
[im 90/166]
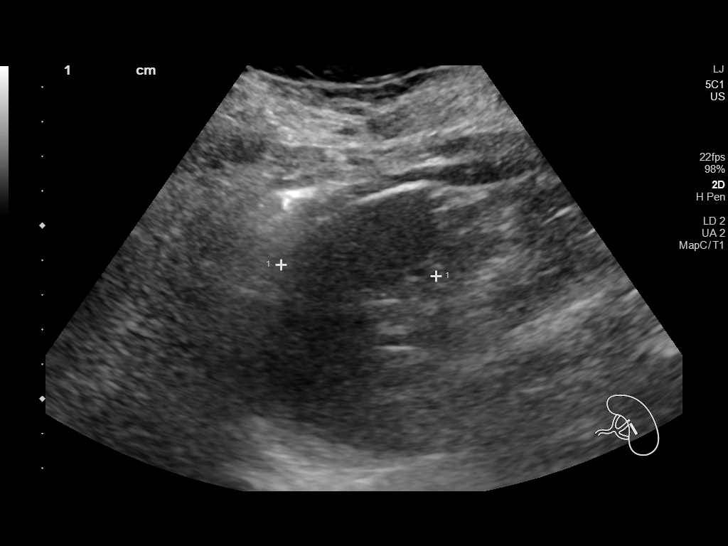
[im 104/166]
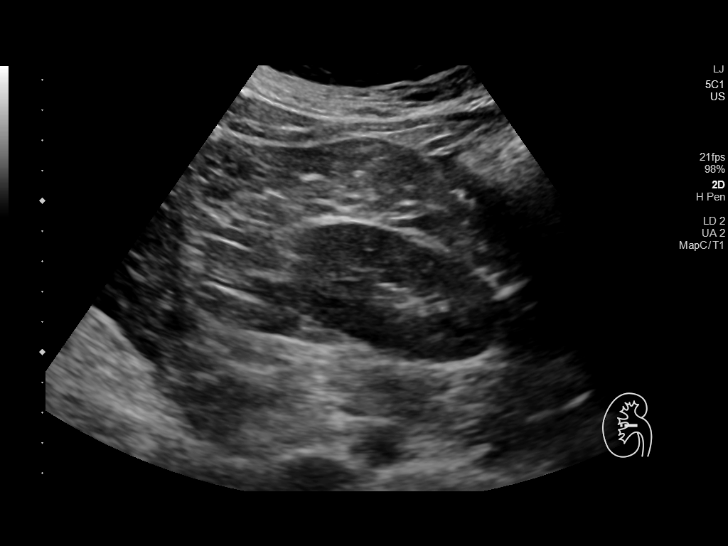
[im 117/166]
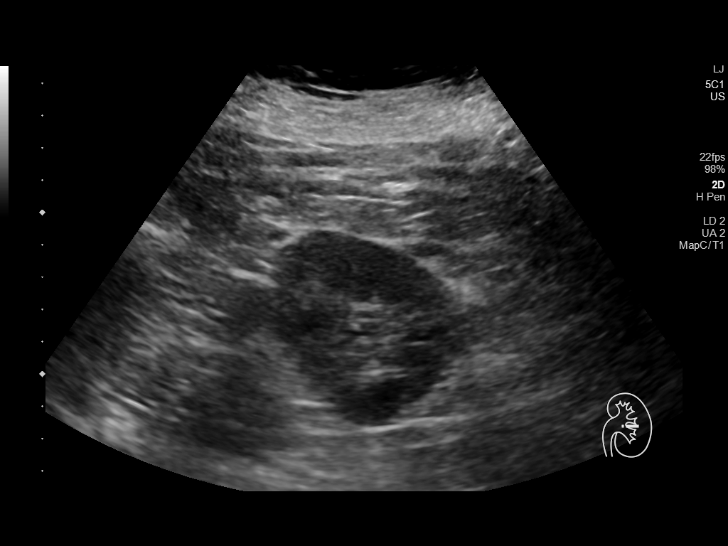
[im 131/166]
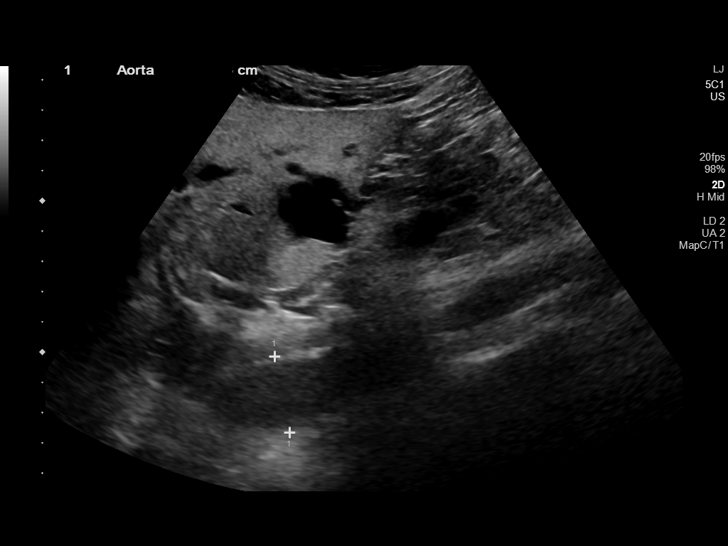
[im 145/166]
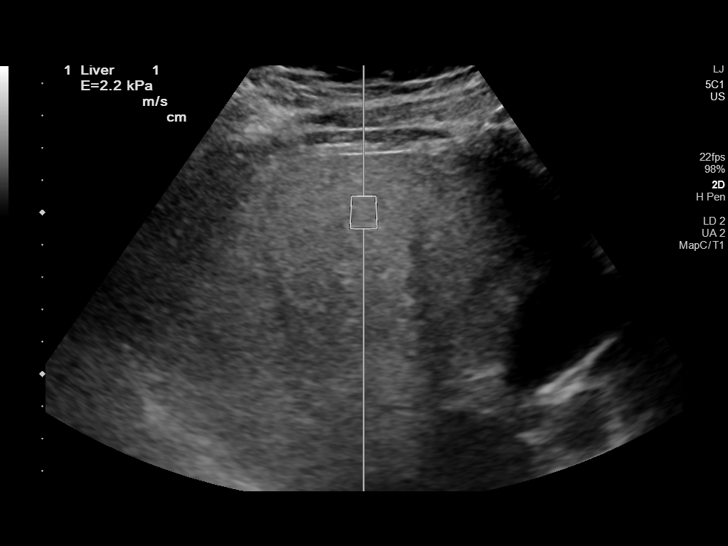
[im 159/166]
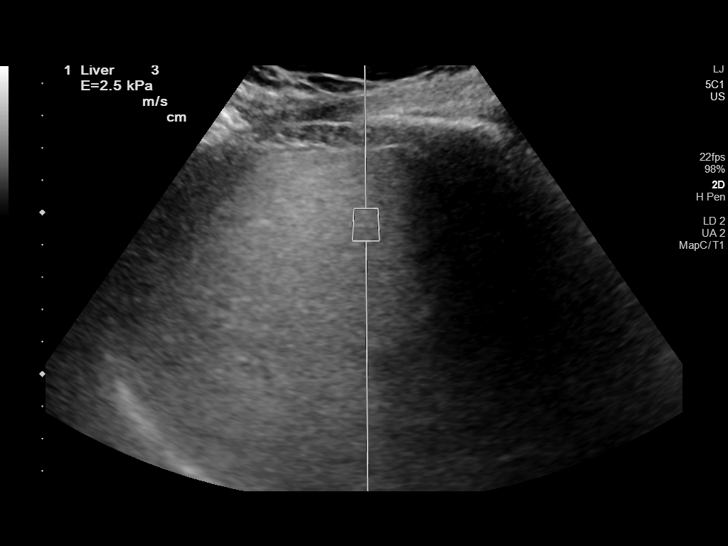

[12 of 25 positions shown; findings below may reference images not displayed]

FINDINGS: ULTRASOUND ABDOMEN

Gallbladder: No gallstones or wall thickening visualized. Comet tail
artifact seen at the gallbladder fundus, likely due to
adenomyomatosis when compared with prior MRI. No sonographic Murphy
sign noted by sonographer.

Common bile duct: Diameter: 3 mm

Liver: Numerous simple appearing cystic lesions are seen throughout
the liver. Reference lesion measuring 3.7 x 2.6 x 2.4 cm. Increased
parenchymal echogenicity. Portal vein is patent on color Doppler
imaging with normal direction of blood flow towards the liver.

IVC: No abnormality visualized.

Pancreas: Visualized portion unremarkable.

Spleen: Size and appearance within normal limits.

Right Kidney: Length: 11.3. Echogenicity within normal limits. No
mass or hydronephrosis visualized. Simple appearing cyst of the mid
region of the right kidney measuring up to 2.5 cm. Simple appearing
cyst of the lower pole of the left kidney, measuring up to 2.5 cm

Left Kidney: Length: 12.1. Echogenicity within normal limits. No
mass or hydronephrosis visualized.

Abdominal aorta: No aneurysm visualized.

Other findings: None.

ULTRASOUND HEPATIC ELASTOGRAPHY

Device: Siemens Helix VTQ

Patient position: Supine

Transducer [id number]

Number of measurements: 10

Hepatic segment:  8

Median kPa:

IQR:

IQR/Median kPa ratio:

Data quality:  Good

Diagnostic category:  < or = 5 kPa: high probability of being normal

The use of hepatic elastography is applicable to patients with viral
hepatitis and non-alcoholic fatty liver disease. At this time, there
is insufficient data for the referenced cut-off values and use in
other causes of liver disease, including alcoholic liver disease.
Patients, however, may be assessed by elastography and serve as
their own reference standard/baseline.

In patients with non-alcoholic liver disease, the values suggesting
compensated advanced chronic liver disease (cACLD) may be lower, and
patients may need additional testing with elasticity results of [DATE]
kPa.

Please note that abnormal hepatic elasticity and shear wave
velocities may also be identified in clinical settings other than
with hepatic fibrosis, such as: acute hepatitis, elevated right
heart and central venous pressures including use of beta blockers,
GOLDSCHMIT-SINNER disease (GOLDSCHMIT-SINNER), infiltrative processes such as
mastocytosis/amyloidosis/infiltrative tumor/lymphoma, extrahepatic
cholestasis, with hyperemia in the post-prandial state, and with
liver transplantation. Correlation with patient history, laboratory
data, and clinical condition recommended.

Diagnostic Categories:

< or =5 kPa: high probability of being normal

< or =9 kPa: in the absence of other known clinical signs, rules [DATE] kPa and ?13 kPa: suggestive of cACLD, but needs further testing

>13 kPa: highly suggestive of cACLD

> or =17 kPa: highly suggestive of cACLD with an increased
probability of clinically significant portal hypertension
IMPRESSION: ULTRASOUND ABDOMEN:

1. Hepatic steatosis.
2. Simple appearing liver and renal cysts.

ULTRASOUND HEPATIC ELASTOGRAPHY:

Median kPa:

Diagnostic category:  < or = 5 kPa: high probability of being normal

## 2021-07-13 NOTE — Progress Notes (Signed)
Kevin Weiss,  Good news, the MRI of your liver showed normal appearing bile ducts, with no evidence of dilation/obstruction/inflammation.  Numerous small simple cysts were seen, which are unlikely to be causing any symptoms or liver enzyme elevation.   The elastography test was also reassuring, showing only simple fatty change with NO significant scarring or fibrosis of the liver.  It is very unlikely that you will have significant liver disease in your lifetime.  Please continue to focus on healthy eating, exercise and weight loss and limit alcohol use to reverse the fatty change noted.   Please contact us with any further questions you may have.

## 2021-07-15 DIAGNOSIS — D508 Other iron deficiency anemias: Secondary | ICD-10-CM

## 2021-07-15 DIAGNOSIS — I10 Essential (primary) hypertension: Secondary | ICD-10-CM

## 2021-07-15 DIAGNOSIS — E782 Mixed hyperlipidemia: Secondary | ICD-10-CM

## 2021-07-16 ENCOUNTER — Telehealth: Payer: Medicare HMO

## 2021-08-05 ENCOUNTER — Ambulatory Visit (INDEPENDENT_AMBULATORY_CARE_PROVIDER_SITE_OTHER): Payer: Medicare HMO | Admitting: Nurse Practitioner

## 2021-08-05 ENCOUNTER — Other Ambulatory Visit: Payer: Self-pay

## 2021-08-05 ENCOUNTER — Encounter: Payer: Self-pay | Admitting: Nurse Practitioner

## 2021-08-05 VITALS — BP 132/84 | HR 94 | Temp 98.4°F | Ht 66.5 in | Wt 250.2 lb

## 2021-08-05 DIAGNOSIS — E782 Mixed hyperlipidemia: Secondary | ICD-10-CM

## 2021-08-05 DIAGNOSIS — I10 Essential (primary) hypertension: Secondary | ICD-10-CM

## 2021-08-05 DIAGNOSIS — R5383 Other fatigue: Secondary | ICD-10-CM

## 2021-08-05 DIAGNOSIS — R7303 Prediabetes: Secondary | ICD-10-CM

## 2021-08-05 DIAGNOSIS — M7989 Other specified soft tissue disorders: Secondary | ICD-10-CM

## 2021-08-05 DIAGNOSIS — Z6839 Body mass index (BMI) 39.0-39.9, adult: Secondary | ICD-10-CM

## 2021-08-05 DIAGNOSIS — L602 Onychogryphosis: Secondary | ICD-10-CM

## 2021-08-05 NOTE — Progress Notes (Signed)
I,Tianna Badgett,acting as a Education administrator for [employer], FNP.,have documented all relevant documentation on the behalf of [name], FNP,as directed by  [name], FNP while in the presence of [name], [place].  This visit occurred during the SARS-CoV-2 public health emergency.  Safety protocols were in place, including screening questions prior to the visit, additional usage of staff PPE, and extensive cleaning of exam room while observing appropriate contact time as indicated for disinfecting solutions.  Subjective:     Patient ID: Kevin Weiss , male    DOB: May 20, 1941 , 80 y.o.   MRN: [id number]   Chief Complaint  Patient presents with   Hypertension    HPI  Pt here today for hypertension f/u pt is compilant with all meds. Patient does have  complaints of left foot swelling for one and a half weeks. No recent long trips, denies pain. Denies this occurring in the past. Only one foot. No shortness of breath. He takes aspirin daily.    BP Readings from Last 3 Encounters: 08/05/21 : 132/84 06/29/21 : 130/72 06/09/21 : 136/74    Hypertension This is a chronic problem. The current episode started more than 1 year ago. Pertinent negatives include no headaches.    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   Qualifier: Diagnosis of  By: Eusebio Friendly     Morbid obesity ([place]) 11/03/2015   OSA on CPAP 11/03/2015   Retrognathia 11/03/2015     Family History  Problem Relation  Age of Onset   Hypertension Mother    Hyperlipidemia Mother    Alzheimer's disease Mother    Cancer Mother        type unbknown   Heart disease Father    Hyperlipidemia Father    Hypertension Father    Hypertension Sister    Hypertension Brother    Heart attack Maternal Grandmother      Current Outpatient Medications:    aspirin 81 MG tablet, Take 81 mg by mouth daily., Disp: , Rfl:    Cholecalciferol (VITAMIN D-3) 1000 UNITS CAPS, Take 1 capsule by mouth daily., Disp: , Rfl:    ferrous sulfate 325 (65 FE) MG tablet, TAKE 1 TABLET (325 MG TOTAL) BY MOUTH 2 (TWICE) TIMES DAILY WITH MEALS., Disp: 180 tablet, Rfl: 1   hydrochlorothiazide (MICROZIDE) 12.5 MG capsule, TAKE 1 CAPSULE BY MOUTH EVERY DAY, Disp: 90 capsule, Rfl: 0   hydrocortisone 2.5 % ointment, Apply topically 2 (two) times daily., Disp: 30 g, Rfl: 0   linaclotide (LINZESS) 145 MCG CAPS capsule, Take 1 capsule (145 mcg total) by mouth daily before breakfast., Disp: 30 capsule, Rfl: 2   lisinopril (ZESTRIL) 10 MG tablet, TAKE 1 TABLET BY MOUTH EVERY DAY, Disp: 90 tablet, Rfl: 0   rosuvastatin (CRESTOR) 10 MG tablet, TAKE 1 TABLET BY MOUTH EVERY DAY (Patient taking differently: TAKE 1 TABLET BY MOUTH ON Monday, Wednesday AND FRIDAYS), Disp: 90 tablet, Rfl:  1   sildenafil (VIAGRA) 100 MG tablet, Take 100 mg by mouth daily as needed., Disp: , Rfl:    Vibegron (GEMTESA) 75 MG TABS, Take by mouth., Disp: , Rfl:    No Known Allergies   Review of Systems  Constitutional: Negative.   Respiratory: Negative.    Cardiovascular: Negative.   Gastrointestinal: Negative.   Neurological: Negative.  Negative for dizziness and headaches.    Today's Vitals   08/05/21 1518  BP: 132/84  Pulse: 94  Temp: 98.4 F (36.9 C)  TempSrc: Oral  Weight: 250 lb 3.2 oz (113.5 kg)  Height: 5' 6.5" (1.689 m)   Body mass index is 39.78 kg/m.  Wt Readings from Last 3 Encounters:  08/05/21 250 lb 3.2 oz (113.5 kg)  06/29/21 255 lb (115.7 kg)   06/09/21 253 lb 9.6 oz (115 kg)    Objective:  Physical Exam Vitals reviewed.  Constitutional:      General: He is not in acute distress.    Appearance: Normal appearance. He is obese.  HENT:     Head: Normocephalic.  Eyes:     Extraocular Movements: Extraocular movements intact.     Conjunctiva/sclera: Conjunctivae normal.     Pupils: Pupils are equal, round, and reactive to light.  Cardiovascular:     Rate and Rhythm: Normal rate and regular rhythm.     Pulses: Normal pulses.     Heart sounds: Normal heart sounds. No murmur heard. Pulmonary:     Effort: Pulmonary effort is normal. No respiratory distress.     Breath sounds: Normal breath sounds. No wheezing.  Musculoskeletal:        General: Swelling (left lower extremity with 1 + edema) present. No tenderness.  Skin:    General: Skin is warm and dry.     Capillary Refill: Capillary refill takes less than 2 seconds.     Comments: Thickened and darkened toenails. He also clipped some of his skin to left great toe  Neurological:     General: No focal deficit present.     Mental Status: He is alert and oriented to person, place, and time.     Cranial Nerves: No cranial nerve deficit.  Psychiatric:        Mood and Affect: Mood normal.        Behavior: Behavior normal.        Thought Content: Thought content normal.        Judgment: Judgment normal.        Assessment And Plan:     1. Prediabetes Comments: Stable, diet controlled.   2. Essential hypertension Comments: Blood pressure is fairly controlled. Continue current medications - CMP14+EGFR  3. Mixed hyperlipidemia Comments: Stable, continue current medications - CMP14+EGFR  4. Class 2 severe obesity due to excess calories with serious comorbidity and body mass index (BMI) of 39.0 to 39.9 in adult [facility]) He is encouraged to initially strive for BMI less than 30 to decrease cardiac risk. He is advised to exercise no less than 150 minutes per week.   5. Left  leg swelling Comments: 1+ edema left lower extremity, encouraged to wear support socks and elevate lower extremity. If shortness of breath or chest pain go to ER - VAS [place] LOWER EXTREMITY VENOUS (DVT); Future - D-dimer, quantitative (not at [facility])  6. Thickened nail Comments: Darkened and thickened toenails, will refer to podiatry for further evaluation - Ambulatory referral to Podiatry  7. Decreased energy Comments: He would like a lipo b injection  but we are out, will notify when received.  Will also check vitamin B12 and TSH levels - Vitamin B12 - TSH    Patient was given opportunity to ask questions. Patient verbalized understanding of the plan and was able to repeat key elements of the plan. All questions were answered to their satisfaction.  [name], FNP   I, [name], FNP, have reviewed all documentation for this visit. The documentation on 08/05/21 for the exam, diagnosis, procedures, and orders are all accurate and complete.   IF YOU HAVE BEEN REFERRED TO A SPECIALIST, IT MAY TAKE 1-2 WEEKS TO SCHEDULE/PROCESS THE REFERRAL. IF YOU HAVE NOT HEARD FROM US/SPECIALIST IN TWO WEEKS, PLEASE GIVE [place] A CALL AT [phone] X [phone].   THE PATIENT IS ENCOURAGED TO PRACTICE SOCIAL DISTANCING DUE TO THE COVID-19 PANDEMIC.

## 2021-08-05 NOTE — Patient Instructions (Signed)

## 2021-08-06 LAB — CMP14+EGFR
ALT: 24 IU/L (ref 0–44)
AST: 21 IU/L (ref 0–40)
Albumin/Globulin Ratio: 1.8 (ref 1.2–2.2)
Albumin: 4.7 g/dL (ref 3.7–4.7)
Alkaline Phosphatase: 211 IU/L — ABNORMAL HIGH (ref 44–121)
BUN/Creatinine Ratio: 15 (ref 10–24)
BUN: 17 mg/dL (ref 8–27)
Bilirubin Total: 0.4 mg/dL (ref 0.0–1.2)
CO2: 27 mmol/L (ref 20–29)
Calcium: 9.9 mg/dL (ref 8.6–10.2)
Chloride: 97 mmol/L (ref 96–106)
Creatinine, Ser: 1.16 mg/dL (ref 0.76–1.27)
Globulin, Total: 2.6 g/dL (ref 1.5–4.5)
Glucose: 88 mg/dL (ref 70–99)
Potassium: 3.9 mmol/L (ref 3.5–5.2)
Sodium: 137 mmol/L (ref 134–144)
Total Protein: 7.3 g/dL (ref 6.0–8.5)
eGFR: 64 mL/min/{1.73_m2} (ref >59)

## 2021-08-06 LAB — TSH: TSH: 1.75 u[IU]/mL (ref 0.450–4.500)

## 2021-08-06 LAB — D-DIMER, QUANTITATIVE: D-DIMER: 0.62 mg/L FEU — ABNORMAL HIGH (ref 0.00–0.49)

## 2021-08-06 LAB — VITAMIN B12: Vitamin B-12: 815 pg/mL (ref 232–1245)

## 2021-08-07 ENCOUNTER — Other Ambulatory Visit: Payer: Self-pay

## 2021-08-07 ENCOUNTER — Ambulatory Visit (HOSPITAL_COMMUNITY)
Admission: RE | Admit: 2021-08-07 | Discharge: 2021-08-07 | Disposition: A | Discharge status: Home / Self Care | Payer: Medicare HMO | Source: Ambulatory Visit | Attending: Cardiovascular Disease | Admitting: Cardiovascular Disease

## 2021-08-07 DIAGNOSIS — M7989 Other specified soft tissue disorders: Secondary | ICD-10-CM | POA: Insufficient documentation

## 2021-08-11 ENCOUNTER — Ambulatory Visit (INDEPENDENT_AMBULATORY_CARE_PROVIDER_SITE_OTHER): Payer: Medicare HMO

## 2021-08-11 DIAGNOSIS — E782 Mixed hyperlipidemia: Secondary | ICD-10-CM

## 2021-08-11 DIAGNOSIS — I10 Essential (primary) hypertension: Secondary | ICD-10-CM

## 2021-08-11 NOTE — Progress Notes (Signed)
Chronic Care Management Pharmacy Note  08/11/2021 Name:  Kevin Weiss MRN:  [id number] DOB:  1940/10/08  Summary: Patient reports that he is doing well with his medications.   Recommendations/Changes made from today's visit: Recommend patient start to exercise.  Plan: Patient is going to start exercising more for 20 minutes twice per week.    Subjective: Kevin Weiss is an 80 y.o. year old male who is a primary patient of [name], [place].  The CCM team was consulted for assistance with disease management and care coordination needs.    Engaged with patient by telephone for follow up visit in response to provider referral for pharmacy case management and/or care coordination services. Patient had MRI completed on 07/07/2021 and 07/13/2021 and the results came back good. The patient reports they told him to focus on eating healthy, and losing weight.   Consent to Services:  The patient was given information about Chronic Care Management services, agreed to services, and gave verbal consent prior to initiation of services.  Please see initial visit note for detailed documentation.   Patient Care Team: [name], FNP as PCP - General (General Practice) Rex Kras Claudette Stapler, RN as Case Manager Mayford Knife, [facility] (Pharmacist)  Recent office visits: 08/05/2021 PCP OV 04/14/2021 PCP OV  Recent consult visits: 05/04/2021 Cardiology [facility] visits: None in previous 6 months   Objective:  Lab Results  Component Value Date   CREATININE 1.16 08/05/2021   BUN 17 08/05/2021   GFRNONAA 51 (L) 09/10/2020   GFRAA 59 (L) 09/10/2020   NA 137 08/05/2021   K 3.9 08/05/2021   CALCIUM 9.9 08/05/2021   CO2 27 08/05/2021   GLUCOSE 88 08/05/2021    Lab Results  Component Value Date/Time   HGBA1C 5.8 (H) 04/14/2021 12:25 PM   HGBA1C 5.7 (H) 04/08/2020 12:59 PM   MICROALBUR 10 04/08/2020 03:41 PM    Last diabetic Eye exam: No results found for: HMDIABEYEEXA   Last diabetic Foot exam: No results found for: HMDIABFOOTEX   Lab Results  Component Value Date   CHOL 129 04/14/2021   HDL 41 04/14/2021   LDLCALC 72 04/14/2021   TRIG 84 04/14/2021   CHOLHDL 3.1 04/14/2021    Hepatic Function Latest Ref Rng & Units 08/05/2021 06/29/2021 04/14/2021  Total Protein 6.0 - 8.5 g/dL 7.3 - 7.8  Albumin 3.7 - 4.7 g/dL 4.7 - 5.2(H)  AST 0 - 40 IU/L 21 - 25  ALT 0 - 44 IU/L 24 - 30  Alk Phosphatase 44 - 121 IU/L 211(H) 222(H) 225(H)  Total Bilirubin 0.0 - 1.2 mg/dL 0.4 - 0.5    Lab Results  Component Value Date/Time   TSH 1.750 08/05/2021 03:58 PM   TSH 1.860 05/15/2019 12:38 PM   FREET4 1.26 05/15/2019 12:38 PM    CBC Latest Ref Rng & Units 04/14/2021 04/08/2020 10/10/2019  WBC 3.4 - 10.8 x10E3/uL 6.3 6.1 5.5  Hemoglobin 13.0 - 17.7 g/dL 16.4 15.7 15.7  Hematocrit 37.5 - 51.0 % 47.3 45.4 46.0  Platelets 150 - 450 x10E3/uL 224 223 220    No results found for: VD25OH  Clinical ASCVD: Yes  The ASCVD Risk score (Arnett DK, et al., 2019) failed to calculate for the following reasons:   The valid total cholesterol range is 130 to 320 mg/dL    Depression screen [facility] 2/9 11/12/2020 03/20/2020 03/08/2019  Decreased Interest 0 0 0  Down, Depressed, Hopeless 0 0 0  PHQ - 2 Score 0  0 0  Altered sleeping - 0 0  Tired, decreased energy - 0 0  Change in appetite - 0 0  Feeling bad or failure about yourself  - 0 0  Trouble concentrating - 0 0  Moving slowly or fidgety/restless - 0 0  Suicidal thoughts - 0 0  PHQ-9 Score - 0 0  Difficult doing work/chores - Not difficult at all -     Social History   Tobacco Use  Smoking Status Former   Packs/day: 0.25   Years: 10.00   Pack years: 2.50   Types: Cigarettes   Quit date: 1982   Years since quitting: 41.0  Smokeless Tobacco Never  Tobacco Comments   40 + years   BP Readings from Last 3 Encounters:  08/05/21 132/84  06/29/21 130/72  06/09/21 136/74   Pulse Readings from Last 3 Encounters:   08/05/21 94  06/29/21 80  06/09/21 70   Wt Readings from Last 3 Encounters:  08/05/21 250 lb 3.2 oz (113.5 kg)  06/29/21 255 lb (115.7 kg)  06/09/21 253 lb 9.6 oz (115 kg)   BMI Readings from Last 3 Encounters:  08/05/21 39.78 kg/m  06/29/21 40.54 kg/m  06/09/21 39.48 kg/m    Assessment/Interventions: Review of patient past medical history, allergies, medications, health status, including review of consultants reports, laboratory and other test data, was performed as part of comprehensive evaluation and provision of chronic care management services.   SDOH:  (Social Determinants of Health) assessments and interventions performed: No  SDOH Screenings   Alcohol Screen: Not on file  Depression (PHQ2-9): Low Risk    PHQ-2 Score: 0  Financial Resource Strain: Low Risk    Difficulty of Paying Living Expenses: Not hard at all  Food Insecurity: No Food Insecurity   Worried About Charity fundraiser in the Last Year: Never true   Ran Out of Food in the Last Year: Never true  Housing: Not on file  Physical Activity: Inactive   Days of Exercise per Week: 0 days   Minutes of Exercise per Session: 0 min  Social Connections: Not on file  Stress: No Stress Concern Present   Feeling of Stress : Not at all  Tobacco Use: Medium Risk   Smoking Tobacco Use: Former   Smokeless Tobacco Use: Never   Passive Exposure: Not on file  Transportation Needs: No Transportation Needs   Lack of Transportation (Medical): No   Lack of Transportation (Non-Medical): No    CCM Care Plan  No Known Allergies  Medications Reviewed Today     Reviewed by Mayford Knife, [facility] (Pharmacist) on 08/11/21 at 1207  Med List Status: <None>   Medication Order Taking? Sig Documenting Provider Last Dose Status Informant  aspirin 81 MG tablet [id number] No Take 81 mg by mouth daily. [provider] Taking Active   Cholecalciferol (VITAMIN D-3) 1000 UNITS CAPS [id number] No Take 1 capsule by mouth  daily. [provider] Taking Active Self  ferrous sulfate 325 (65 FE) MG tablet [id number] No TAKE 1 TABLET (325 MG TOTAL) BY MOUTH 2 (TWICE) TIMES DAILY WITH MEALS. [name], FNP Taking Active   hydrochlorothiazide (MICROZIDE) 12.5 MG capsule [id number] No TAKE 1 CAPSULE BY MOUTH EVERY DAY [name], FNP Taking Active   hydrocortisone 2.5 % ointment [id number] No Apply topically 2 (two) times daily. [name], FNP Taking Active   linaclotide [facility]) 145 MCG CAPS capsule [id number] No Take 1 capsule (145 mcg total) by mouth daily before breakfast. Laurance Flatten,  Doreene Burke, FNP Taking Active   lisinopril (ZESTRIL) 10 MG tablet [id number] No TAKE 1 TABLET BY MOUTH EVERY DAY [name], FNP Taking Active   rosuvastatin (CRESTOR) 10 MG tablet [id number] No TAKE 1 TABLET BY MOUTH EVERY DAY  Patient taking differently: TAKE 1 TABLET BY MOUTH ON Monday, Wednesday AND FRIDAYS   [name], FNP Taking Active   sildenafil (VIAGRA) 100 MG tablet [id number] No Take 100 mg by mouth daily as needed. [provider] Taking Active   Vibegron (GEMTESA) 75 MG TABS [id number] No Take by mouth. [provider] Taking Active             Patient Active Problem List   Diagnosis Date Noted   Prediabetes 02/02/2019   Primary osteoarthritis of left hip 04/11/2018   Preop cardiovascular exam 03/30/2018   Abnormal echocardiogram 03/30/2018   Obese abdomen 07/22/2016   Retrognathia 11/03/2015   Morbid obesity ([place]) 11/03/2015   Cardiac hypertrophy 11/03/2015   OSA on CPAP 11/03/2015   Degenerative joint disease (DJD) of hip 03/12/2014   OBESITY, NOS 10/13/2006   HYPERTENSION, BENIGN SYSTEMIC 10/13/2006   CHF, EJECTION FRACTION > OR = 50% 10/13/2006   CARDIOMEGALY 10/13/2006   HEMORRHOIDS, NOS 10/13/2006   GASTROESOPHAGEAL REFLUX, NO ESOPHAGITIS 10/13/2006   IMPOTENCE, 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   Sleep apnea 10/13/2006    Immunization History  Administered Date(s) Administered    Fluad Quad(high Dose 65+) 06/27/2020, 06/09/2021   Influenza, High Dose Seasonal PF 06/14/2019   Influenza-Unspecified 03/16/2014   PFIZER Comirnaty(Gray Top)Covid-19 Tri-Sucrose Vaccine 01/21/2021, 07/02/2021   PFIZER(Purple Top)SARS-COV-2 Vaccination 08/28/2019, 09/17/2019, 05/20/2020   Pneumococcal Conjugate-13 06/24/2020   Pneumococcal Polysaccharide-23 03/14/2014, 04/13/2018   Td 08/16/1996   Tdap 04/21/2021   Zoster Recombinat (Shingrix) 04/21/2021, 07/02/2021    Conditions to be addressed/monitored:  Hypertension, Hyperlipidemia, and Obesity  Care Plan : [place]  Updates made by Mayford Knife, [place] since 08/11/2021 12:00 AM     Problem: HTN, HLD, Constipation, Overactive Bladder      Long-Range Goal: Disease Management   Recent Progress: On track  Note:     Current Barriers:  Unable to independently afford treatment regimen  Pharmacist Clinical Goal(s):  Patient will achieve adherence to monitoring guidelines and medication adherence to achieve therapeutic efficacy through collaboration with PharmD and provider.   Interventions: 1:1 collaboration with [name], FNP regarding development and update of comprehensive plan of care as evidenced by provider attestation and co-signature Inter-disciplinary care team collaboration (see longitudinal plan of care) Comprehensive medication review performed; medication list updated in electronic medical record  Hypertension (BP goal <130/80) -Controlled -Current treatment: Hydrochlorothiazide 12.5 mg capsule once per day  Lisinopril 10 mg tablet once per day  -Current home readings: <130/80, his readings have been always less than that -Current dietary habits: limiting the amount of salt he eats.  -Current exercise habits: discussed further in obesity -Denies hypotensive/hypertensive symptoms -Educated on Daily salt intake goal < 2300 mg; Importance of home blood pressure monitoring; Proper BP  monitoring technique; -Counseled to monitor BP at home at least once per week, document, and provide log at future appointments -Recommended to continue current medication  Hyperlipidemia: (LDL goal < 70) -Controlled -Current treatment: Rosuvastatin 10 mg tablet once per day  Aspirin 81 mg tablet once per day -Current dietary patterns: he is not eating a lot of fried and fatty foods. He occasionally eats fried chicken.  -Current exercise habits: patient reports that he is not doing as well as he  would like with exercise.  -He would like to lose 30 pounds to start with as a goal.  -Educated on Importance of limiting foods high in cholesterol; Exercise goal of 150 minutes per week; -Recommended to continue current medication  Obesity (Goal: Lose 30 pounds) -Not ideally controlled -Medications previously tried: not currently taking any medication  -Patient is not currently using his gym membership at this time.  -He is worried that he needs more energy and motivation.  -He also reports has a treadmill in the house that he can use.   -Goal created to increase the amount of vegetables he eats, he is incorporating more dark leafy vegetables  -Patient discussed that he was a runner, and used to run 7 miles frequently.  -Collaborated with patient to make a goal of two times per week for 20 minutes until he is comfortable, and to possibly increase to 30 minutes.  -Counseled on diet and exercise extensively  Patient Goals/Self-Care Activities Patient will:  - take medications as prescribed as evidenced by patient report and record review target a minimum of 150 minutes of moderate intensity exercise weekly  Follow Up Plan: The patient has been provided with contact information for the care management team and has been advised to call with any health related questions or concerns.       Medication Assistance: Application for [employer]  medication assistance program. in process.  Anticipated  assistance start date 08/2021.  See plan of care for additional detail.  Compliance/Adherence/Medication fill history: Care Gaps: No care gaps noted   Star-Rating Drugs: Lisinopril 10 mg tablet Rosuvastatin 10 mg tablet   Patient's preferred pharmacy is:  [id number] [address] [place] [address]Phone: [phone]:: [phone] Uses pill box? Yes Pt endorses 95% compliance  We discussed: Benefits of medication synchronization, packaging and delivery as well as enhanced pharmacist oversight with [employer]. Patient decided to: Continue current medication management strategy  Care Plan and Follow Up Patient Decision:  Patient agrees to Care Plan and Follow-up.  Plan: The patient has been provided with contact information for the care management team and has been advised to call with any health related questions or concerns.   VOrlando Penner CPP, PharmD Clinical Pharmacist Practitioner [facility] [phone]

## 2021-08-11 NOTE — Patient Instructions (Signed)
Visit Information It was great speaking with you today!  Please let me know if you have any questions about our visit.   Goals Addressed             This Visit's Progress    Manage My Medicine       Timeframe:  Long-Range Goal Priority:  High Start Date:                             Expected End Date:                       Follow Up Date 11/11/2021   In Progress: - call for medicine refill 2 or 3 days before it runs out - call if I am sick and can't take my medicine - keep a list of all the medicines I take; vitamins and herbals too - use a pillbox to sort medicine - use an alarm clock or phone to remind me to take my medicine    Why is this important?   These steps will help you keep on track with your medicines.   Notes:  Please call with any questions        Patient Care Plan: CCM Pharmacy Care Plan     Problem Identified: HTN, HLD, Obesity      Long-Range Goal: Disease Management   Recent Progress: On track  Note:     Current Barriers:  Unable to independently afford treatment regimen  Pharmacist Clinical Goal(s):  Patient will achieve adherence to monitoring guidelines and medication adherence to achieve therapeutic efficacy through collaboration with PharmD and provider.   Interventions: 1:1 collaboration with [name], FNP regarding development and update of comprehensive plan of care as evidenced by provider attestation and co-signature Inter-disciplinary care team collaboration (see longitudinal plan of care) Comprehensive medication review performed; medication list updated in electronic medical record  Hypertension (BP goal <130/80) -Controlled -Current treatment: Hydrochlorothiazide 12.5 mg capsule once per day  Lisinopril 10 mg tablet once per day  -Current home readings: <130/80, his readings have been always less than that -Current dietary habits: limiting the amount of salt he eats.  -Current exercise habits: discussed further in  obesity -Denies hypotensive/hypertensive symptoms -Educated on Daily salt intake goal < 2300 mg; Importance of home blood pressure monitoring; Proper BP monitoring technique; -Counseled to monitor BP at home at least once per week, document, and provide log at future appointments -Recommended to continue current medication  Hyperlipidemia: (LDL goal < 70) -Controlled -Current treatment: Rosuvastatin 10 mg tablet once per day  Aspirin 81 mg tablet once per day -Current dietary patterns: he is not eating a lot of fried and fatty foods. He occasionally eats fried chicken.  -Current exercise habits: patient reports that he is not doing as well as he would like with exercise.  -He would like to lose 30 pounds to start with as a goal.  -Educated on Importance of limiting foods high in cholesterol; Exercise goal of 150 minutes per week; -Recommended to continue current medication  Obesity (Goal: Lose 30 pounds) -Not ideally controlled -Medications previously tried: not currently taking any medication  -Patient is not currently using his gym membership at this time.  -He is worried that he needs more energy and motivation.  -He also reports has a treadmill in the house that he can use.   -Goal created to increase the amount of vegetables he eats, he is  incorporating more dark leafy vegetables  -Patient discussed that he was a runner, and used to run 7 miles frequently.  -Collaborated with patient to make a goal of two times per week for 20 minutes until he is comfortable, and to possibly increase to 30 minutes.  -Counseled on diet and exercise extensively  Patient Goals/Self-Care Activities Patient will:  - take medications as prescribed as evidenced by patient report and record review target a minimum of 150 minutes of moderate intensity exercise weekly  Follow Up Plan: The patient has been provided with contact information for the care management team and has been advised to call with  any health related questions or concerns.       Patient agreed to services and verbal consent obtained.   The patient verbalized understanding of instructions, educational materials, and care plan provided today and agreed to receive a mailed copy of patient instructions, educational materials, and care plan.   Orlando Penner, PharmD Clinical Pharmacist [facility] [phone]

## 2021-08-12 ENCOUNTER — Encounter: Payer: Self-pay | Admitting: Podiatry

## 2021-08-12 ENCOUNTER — Other Ambulatory Visit: Payer: Self-pay

## 2021-08-12 ENCOUNTER — Ambulatory Visit (INDEPENDENT_AMBULATORY_CARE_PROVIDER_SITE_OTHER): Payer: Medicare HMO | Admitting: Podiatry

## 2021-08-12 DIAGNOSIS — M79674 Pain in right toe(s): Secondary | ICD-10-CM

## 2021-08-12 DIAGNOSIS — R600 Localized edema: Secondary | ICD-10-CM

## 2021-08-12 DIAGNOSIS — M79675 Pain in left toe(s): Secondary | ICD-10-CM

## 2021-08-12 DIAGNOSIS — B351 Tinea unguium: Secondary | ICD-10-CM | POA: Diagnosis not present

## 2021-08-12 NOTE — Progress Notes (Signed)
Subjective:   Patient ID: Kevin Weiss, male   DOB: 80 y.o.   MRN: [id number]   HPI Patient presents stating she has significant thickness of nailbeds 1-5 both feet that are painful and has swelling of her left foot and just had a Doppler done which was negative   Review of Systems  All other systems reviewed and are negative.      Objective:  Physical Exam Vitals and nursing note reviewed.  Constitutional:      Appearance: He is well-developed.  Pulmonary:     Effort: Pulmonary effort is normal.  Musculoskeletal:        General: Normal range of motion.  Skin:    General: Skin is warm.  Neurological:     Mental Status: He is alert.    Neurovascular status was found to be intact with negative Bevelyn Buckles' sign noted.  She has mild edema of the left forefoot +1 pitting localized to this area no systemic signs of pathology.  Patient has thick yellow brittle nailbeds 1-5 both feet painful     Assessment:  Appears to be some form of localized inflammation left with no current indications of blood clot but I did make her aware that even though she did was tested for it she has a negative Bevelyn Buckles' sign if she should develop any swelling of her leg or shortness of breath she should go straight to the emergency room.  Nailbeds are thickened with mycotic component 1-5 both feet     Plan:  H&P reviewed all conditions and did dispense ankle compression stocking left to try to help with swelling and debrided nailbeds 1-5 both feet no iatrogenic bleeding and will reappoint routine care

## 2021-08-15 DIAGNOSIS — I10 Essential (primary) hypertension: Secondary | ICD-10-CM

## 2021-08-15 DIAGNOSIS — E782 Mixed hyperlipidemia: Secondary | ICD-10-CM

## 2021-08-18 ENCOUNTER — Ambulatory Visit: Payer: Medicare HMO | Admitting: Nurse Practitioner

## 2021-08-20 ENCOUNTER — Ambulatory Visit (INDEPENDENT_AMBULATORY_CARE_PROVIDER_SITE_OTHER): Payer: Medicare HMO

## 2021-08-20 ENCOUNTER — Telehealth: Payer: Medicare HMO

## 2021-08-20 DIAGNOSIS — I10 Essential (primary) hypertension: Secondary | ICD-10-CM

## 2021-08-20 DIAGNOSIS — R7303 Prediabetes: Secondary | ICD-10-CM

## 2021-08-20 DIAGNOSIS — D508 Other iron deficiency anemias: Secondary | ICD-10-CM

## 2021-08-20 DIAGNOSIS — K5900 Constipation, unspecified: Secondary | ICD-10-CM

## 2021-08-20 DIAGNOSIS — R748 Abnormal levels of other serum enzymes: Secondary | ICD-10-CM

## 2021-08-20 DIAGNOSIS — E782 Mixed hyperlipidemia: Secondary | ICD-10-CM

## 2021-08-21 NOTE — Chronic Care Management (AMB) (Addendum)
Chronic Care Management   CCM RN Visit Note  08/20/2021 Name: Kevin Weiss MRN: [id number] DOB: 05/05/1941  Subjective: Kevin Weiss is a 81 y.o. year old male who is a primary care patient of [name], [place]. The care management team was consulted for assistance with disease management and care coordination needs.    Engaged with patient by telephone for follow up visit in response to provider referral for case management and/or care coordination services.   Consent to Services:  The patient was given information about Chronic Care Management services, agreed to services, and gave verbal consent prior to initiation of services.  Please see initial visit note for detailed documentation.   Patient agreed to services and verbal consent obtained.   Assessment: Review of patient past medical history, allergies, medications, health status, including review of consultants reports, laboratory and other test data, was performed as part of comprehensive evaluation and provision of chronic care management services.   SDOH (Social Determinants of Health) assessments and interventions performed:  Yes, no acute challenges   CCM Care Plan  No Known Allergies  Outpatient Encounter Medications as of 08/20/2021  Medication Sig   aspirin 81 MG tablet Take 81 mg by mouth daily.   Cholecalciferol (VITAMIN D-3) 1000 UNITS CAPS Take 1 capsule by mouth daily.   ferrous sulfate 325 (65 FE) MG tablet TAKE 1 TABLET (325 MG TOTAL) BY MOUTH 2 (TWICE) TIMES DAILY WITH MEALS.   hydrochlorothiazide (MICROZIDE) 12.5 MG capsule TAKE 1 CAPSULE BY MOUTH EVERY DAY   hydrocortisone 2.5 % ointment Apply topically 2 (two) times daily.   linaclotide (LINZESS) 145 MCG CAPS capsule Take 1 capsule (145 mcg total) by mouth daily before breakfast.   lisinopril (ZESTRIL) 10 MG tablet TAKE 1 TABLET BY MOUTH EVERY DAY   rosuvastatin (CRESTOR) 10 MG tablet TAKE 1 TABLET BY MOUTH EVERY DAY (Patient taking differently:  TAKE 1 TABLET BY MOUTH ON Monday, Wednesday AND FRIDAYS)   sildenafil (VIAGRA) 100 MG tablet Take 100 mg by mouth daily as needed.   Vibegron (GEMTESA) 75 MG TABS Take by mouth.   No facility-administered encounter medications on file as of 08/20/2021.    Patient Active Problem List   Diagnosis Date Noted   Prediabetes 02/02/2019   Primary osteoarthritis of left hip 04/11/2018   Preop cardiovascular exam 03/30/2018   Abnormal echocardiogram 03/30/2018   Obese abdomen 07/22/2016   Retrognathia 11/03/2015   Morbid obesity ([place]) 11/03/2015   Cardiac hypertrophy 11/03/2015   OSA on CPAP 11/03/2015   Degenerative joint disease (DJD) of hip 03/12/2014   OBESITY, NOS 10/13/2006   HYPERTENSION, BENIGN SYSTEMIC 10/13/2006   CHF, EJECTION FRACTION > OR = 50% 10/13/2006   CARDIOMEGALY 10/13/2006   HEMORRHOIDS, NOS 10/13/2006   GASTROESOPHAGEAL REFLUX, NO ESOPHAGITIS 10/13/2006   IMPOTENCE, 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   Sleep apnea 10/13/2006    Conditions to be addressed/monitored: Prediabetes, HTN, Mixed Hyperlipidemia, Constipation, unspecified, Iron deficiency Anemia, Elevated alkaline phosphatase, Obesity  Care Plan : RN Care Manager Plan of Care  Updates made by Lynne Logan, RN since 08/20/2021 12:00 AM     Problem: No plan of care esatblished for management of chronic disease states (Prediabetes, HTN, Mixed Hyperlipidemia, Constipation, unspecified, Iron deficiency Anemia, Elevated alkaline phosphatase, Obesity)   Priority: High     Long-Range Goal: Establishment of plan of care for management of chronic disease states (Prediabetes, HTN, Mixed Hyperlipidemia, Constipation, unspecified, Iron deficiency Anemia, Elevated alkaline phosphatase, Obesity)   Start Date: 08/21/2021  Expected End Date: 08/21/2022  This Visit's Progress: On track  Priority: High  Note:   Current Barriers:  Knowledge Deficits related to plan of care for management of Prediabetes, HTN, Mixed Hyperlipidemia,  Constipation, unspecified, Iron deficiency Anemia, Elevated alkaline phosphatase, Obesity  Chronic Disease Management support and education needs related to Prediabetes, HTN, Mixed Hyperlipidemia, Constipation, unspecified, Iron deficiency Anemia, Elevated alkaline phosphatase, Obesity   RNCM Clinical Goal(s):  Patient will verbalize basic understanding of  Prediabetes, HTN, Mixed Hyperlipidemia, Constipation, unspecified, Iron deficiency Anemia, Elevated alkaline phosphatase, Obesity disease process and self health management plan as evidenced by patient will report having no exacerbations related to chronic disease states  take all medications exactly as prescribed and will call provider for medication related questions as evidenced by patient will report having no missed doses of his prescribed medications  demonstrate Ongoing health management independence as evidenced by patient will report 100% adherence to his prescribed treatment plan  continue to work with RN Care Manager to address care management and care coordination needs related to  Prediabetes, HTN, Mixed Hyperlipidemia, Constipation, unspecified, Iron deficiency Anemia, Elevated alkaline phosphatase, Obesity as evidenced by adherence to CM Team Scheduled appointments demonstrate ongoing self health care management ability   as evidenced by    through collaboration with RN Care manager, provider, and care team.   Interventions: 1:1 collaboration with primary care provider regarding development and update of comprehensive plan of care as evidenced by provider attestation and co-signature Inter-disciplinary care team collaboration (see longitudinal plan of care) Evaluation of current treatment plan related to  self management and patient's adherence to plan as established by provider   Chronic Kidney Disease Interventions:  (Status:  Goal on track:  Yes.) Long Term Goal Assessed the Patient understanding of chronic kidney disease     Reviewed prescribed diet increase water to 64 oz daily unless otherwise directed  Provided education on kidney disease progression    Discussed plans with patient for ongoing care management follow up and provided patient with direct contact information for care management team Last practice recorded BP readings:  BP Readings from Last 3 Encounters:  08/05/21 132/84  06/29/21 130/72  06/09/21 136/74  Most recent eGFR/CrCl:  Lab Results  Component Value Date   EGFR 64 08/05/2021    No components found for: CRCL  Chronic Constipation Interventions:  (Status:  Goal on track:  Yes.)  Long Term Goal Evaluation of current treatment plan related to  Chronic Constipation , self-management and patient's adherence to plan as established by provider Reviewed medications with patient and discussed importance of medication adherence, including use of Linzess, patient reports good effectiveness from use of this medication  Educated patient while providing rationale regarding the effectiveness  Discussed plans with patient for ongoing care management follow up and provided patient with direct contact information for care management team  Hyperlipidemia Interventions:  (Status:  Goal on track:  Yes.) Long Term Goal Evaluation of current treatment plan related to Mixed Hyperlipidemia, self-management and patient's adherence to plan as established by provider Medication review performed; medication list updated in electronic medical record.  Provider established cholesterol goals reviewed Counseled on importance of regular laboratory monitoring as prescribed Reviewed importance of limiting foods high in cholesterol Reviewed exercise goals and target of 150 minutes per week  Discussed plans with patient for ongoing care management follow up and provided patient with direct contact information for care management team  Patient Goals/Self-Care Activities: Take all medications as prescribed Attend all  scheduled  provider appointments Call pharmacy for medication refills 3-7 days in advance of running out of medications Perform all self care activities independently  Perform IADL's (shopping, preparing meals, housekeeping, managing finances) independently Call provider office for new concerns or questions  adhere to prescribed diet: low fat, low Cholesterol  develop an exercise routine  Follow Up Plan:  Telephone follow up appointment with care management team member scheduled for:  12/11/21     Plan:Telephone follow up appointment with care management team member scheduled for:  12/11/21  Barb Merino, RN, BSN, CCM Care Management Coordinator [place] [facility]  Direct Phone: [phone]

## 2021-08-21 NOTE — Patient Instructions (Signed)
Visit Information  Thank you for taking time to visit with me today. Please don't hesitate to contact me if I can be of assistance to you before our next scheduled telephone appointment.  Following are the goals we discussed today:  (Copy and paste patient goals from clinical care plan here)  Our next appointment is by telephone on 12/11/21 at 10:30 AM  Please call the care guide team at [phone] if you need to cancel or reschedule your appointment.   If you are experiencing a Mental Health or [place] or need someone to talk to, please call [phone]-TALK (toll free, 24 hour hotline)   Patient verbalizes understanding of instructions provided today and agrees to view in [place].    Barb Merino, RN, BSN, CCM Care Management Coordinator [place] [facility]  Direct Phone: [phone]

## 2021-09-03 ENCOUNTER — Ambulatory Visit (INDEPENDENT_AMBULATORY_CARE_PROVIDER_SITE_OTHER): Payer: Medicare HMO

## 2021-09-03 VITALS — Ht 67.5 in | Wt 245.0 lb

## 2021-09-03 DIAGNOSIS — Z0000 Encounter for general adult medical examination without abnormal findings: Secondary | ICD-10-CM

## 2021-09-03 NOTE — Progress Notes (Signed)
I connected with Algis Lehenbauer today by telephone and verified that I am speaking with the correct person using two identifiers. Location patient: home Location provider: work Persons participating in the virtual visit: [place], Peart LPN.   I discussed the limitations, risks, security and privacy concerns of performing an evaluation and management service by telephone and the availability of in person appointments. I also discussed with the patient that there may be a patient responsible charge related to this service. The patient expressed understanding and verbally consented to this telephonic visit.    Interactive audio and video telecommunications were attempted between this provider and patient, however failed, due to patient having technical difficulties OR patient did not have access to video capability.  We continued and completed visit with audio only.     Vital signs may be patient reported or missing.  Subjective:   Kevin Weiss is a 81 y.o. male who presents for Medicare Annual/Subsequent preventive examination.  Review of Systems     Cardiac Risk Factors include: advanced age (>44men, >36 women);hypertension;obesity (BMI >30kg/m2)     Objective:    Today's Vitals   09/03/21 0907  Weight: 245 lb (111.1 kg)  Height: 5' 7.5" (1.715 m)   Body mass index is 37.81 kg/m.  Advanced Directives 09/03/2021 11/12/2020 03/20/2020 03/08/2019 05/05/2018 04/11/2018 03/31/2018  Does Patient Have a Medical Advance Directive? Yes Yes Yes Yes Yes Yes Yes  Type of Paramedic of [place];Living will [place];Living will [place];Living will [place];Living will Living will [place];Living will [place];Living will  Does patient want to make changes to medical advance directive? - - - No - Patient declined - No - Patient declined -  Copy of [place] in Chart? No - copy requested No - copy requested No - copy requested No - copy requested - - No - copy requested    Current Medications (verified) Outpatient Encounter Medications as of 09/03/2021  Medication Sig   aspirin 81 MG tablet Take 81 mg by mouth daily.   Cholecalciferol (VITAMIN D-3) 1000 UNITS CAPS Take 1 capsule by mouth daily.   ferrous sulfate 325 (65 FE) MG tablet TAKE 1 TABLET (325 MG TOTAL) BY MOUTH 2 (TWICE) TIMES DAILY WITH MEALS.   hydrochlorothiazide (MICROZIDE) 12.5 MG capsule TAKE 1 CAPSULE BY MOUTH EVERY DAY   hydrocortisone 2.5 % ointment Apply topically 2 (two) times daily.   linaclotide (LINZESS) 145 MCG CAPS capsule Take 1 capsule (145 mcg total) by mouth daily before breakfast.   lisinopril (ZESTRIL) 10 MG tablet TAKE 1 TABLET BY MOUTH EVERY DAY   Multiple Vitamins-Minerals (CENTRUM SILVER ADULT 50+) TABS Take 1 tablet by mouth daily.   rosuvastatin (CRESTOR) 10 MG tablet TAKE 1 TABLET BY MOUTH EVERY DAY (Patient taking differently: TAKE 1 TABLET BY MOUTH ON Monday, Wednesday AND FRIDAYS)   sildenafil (VIAGRA) 100 MG tablet Take 100 mg by mouth daily as needed.   Vibegron (GEMTESA) 75 MG TABS Take by mouth.   No facility-administered encounter medications on file as of 09/03/2021.    Allergies (verified) Patient has no known allergies.   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   Qualifier: Diagnosis of  By: Eusebio Friendly      Morbid obesity ([place]) 11/03/2015   OSA on CPAP 11/03/2015   Retrognathia 11/03/2015   Past Surgical History:  Procedure Laterality Date   COLONOSCOPY     KNEE ARTHROSCOPY Left 15+yrs ago   TOTAL HIP ARTHROPLASTY Right 03/12/2014   Procedure: TOTAL HIP ARTHROPLASTY ANTERIOR APPROACH;  Surgeon: Hessie Dibble, MD;  Location: [facility];  Service: Orthopedics;  Laterality: Right;   TOTAL HIP ARTHROPLASTY Left 04/11/2018   Procedure: TOTAL HIP ARTHROPLASTY ANTERIOR APPROACH;  Surgeon: Melrose Nakayama, MD;  Location: [place];  Service: Orthopedics;  Laterality: Left;   Family History  Problem Relation Age of Onset   Hypertension Mother    Hyperlipidemia Mother    Alzheimer's disease Mother    Cancer Mother        type unbknown   Heart disease Father    Hyperlipidemia Father    Hypertension Father    Hypertension Sister    Hypertension Brother    Heart attack Maternal Grandmother    Social History   Socioeconomic History   Marital status: Married    Spouse name: Not on file   Number of children: 0   Years of education: Not on file   Highest education level: Not on file  Occupational History   Occupation: retired  Tobacco Use   Smoking status: Former    Packs/day: 0.25    Years: 10.00    Pack years: 2.50    Types: Cigarettes    Quit date: 1982    Years since quitting: 41.0   Smokeless tobacco: Never   Tobacco comments:    40 + years  Vaping Use   Vaping Use: Never used  Substance and Sexual Activity   Alcohol use: Yes    Comment: social once every 3-4 months   Drug use: Not Currently    Types: Marijuana    Comment: 03/30/18   Sexual activity: Yes  Other Topics Concern   Not on file  Social History Narrative   Live at home with wife. Retired from [employer] as a Librarian, academic.    Social Determinants of Health   Financial Resource Strain: Low Risk    Difficulty of Paying Living Expenses: Not hard at all  Food Insecurity: No Food Insecurity   Worried  About Charity fundraiser in the Last Year: Never true   [place] in the Last Year: Never true  Transportation Needs: No Transportation Needs   Lack of Transportation (Medical): No   Lack of Transportation (Non-Medical): No  Physical Activity: Insufficiently Active   Days of Exercise per Week: 3 days   Minutes of Exercise per Session: 20 min  Stress: No Stress Concern Present   Feeling of Stress : Not at all  Social Connections: Not on file    Tobacco Counseling Counseling given: Not Answered Tobacco comments: 40 + years   Clinical Intake:  Pre-visit preparation completed: Yes  Pain : No/denies pain     Nutritional Status: BMI > 30  Obese Nutritional Risks: None Diabetes: No  How often do you need to have someone help  you when you read instructions, pamphlets, or other written materials from your doctor or pharmacy?: 1 - Never What is the last grade level you completed in school?: 12th grade  Diabetic? no  Interpreter Needed?: No  Information entered by :: NAllen LPN   Activities of Daily Living In your present state of health, do you have any difficulty performing the following activities: 09/03/2021 11/12/2020  Hearing? Y Y  Comment decreased hearing of one ear slight hearing loss in right ear  Vision? N N  Difficulty concentrating or making decisions? N N  Walking or climbing stairs? N N  Dressing or bathing? N N  Doing errands, shopping? N N  Preparing Food and eating ? N N  Using the Toilet? N N  In the past six months, have you accidently leaked urine? N Y  Comment takes gemtesa -  Do you have problems with loss of bowel control? N N  Managing your Medications? N N  Managing your Finances? N N  Housekeeping or managing your Housekeeping? N N  Some recent data might be hidden    Patient Care Team: [name], FNP as PCP - General ([place]) Rex Kras, Claudette Stapler, RN as Case Manager Mayford Knife, [facility] (Pharmacist)  Indicate any recent  Medical Services you may have received from other than [facility] providers in the past year (date may be approximate).     Assessment:   This is a routine wellness examination for [place].  Hearing/Vision screen Vision Screening - Comments:: No regular eye exams, [facility]  Dietary issues and exercise activities discussed: Current Exercise Habits: Home exercise routine, Type of exercise: treadmill, Time (Minutes): 20, Frequency (Times/Week): 3, Weekly Exercise (Minutes/Week): 60   Goals Addressed             This Visit's Progress    Patient Stated       09/03/2021, wants to exercise 150 minutes a week       Depression Screen PHQ 2/9 Scores 09/03/2021 11/12/2020 03/20/2020 03/08/2019 01/23/2019 08/15/2018  PHQ - 2 Score 0 0 0 0 0 0  PHQ- 9 Score - - 0 0 - -    Fall Risk Fall Risk  09/03/2021 11/12/2020 03/20/2020 06/21/2019 03/08/2019  Falls in the past year? 1 1 0 0 0  Comment fell off a ladder and platform, tripped on stairs fell off ladder - - -  Number falls in past yr: 1 0 - - 0  Injury with Fall? 0 0 - - -  Risk for fall due to : Medication side effect Medication side effect Medication side effect - Medication side effect  Follow up Falls evaluation completed;Education provided;Falls prevention discussed Falls evaluation completed;Education provided;Falls prevention discussed Falls evaluation completed;Education provided;Falls prevention discussed - Falls evaluation completed;Education provided;Falls prevention discussed    FALL RISK PREVENTION PERTAINING TO THE HOME:  Any stairs in or around the home? Yes  If so, are there any without handrails? Yes  Home free of loose throw rugs in walkways, pet beds, electrical cords, etc? Yes  Adequate lighting in your home to reduce risk of falls? Yes   ASSISTIVE DEVICES UTILIZED TO PREVENT FALLS:  Life alert? No  Use of a cane, walker or w/c? No  Grab bars in the bathroom? No  Shower chair or bench in shower? Yes  Elevated toilet seat  or a handicapped toilet? Yes   TIMED UP AND GO:  Was the test performed? No .      Cognitive Function:  6CIT Screen 09/03/2021 11/12/2020 03/20/2020 03/08/2019  What Year? 0 points 0 points 4 points 0 points  What month? 0 points 0 points 0 points 0 points  What time? 0 points 3 points 0 points 0 points  Count back from 20 0 points 0 points 0 points 0 points  Months in reverse 2 points 0 points 0 points 0 points  Repeat phrase 0 points 2 points 2 points 0 points  Total Score 2 5 6  0    Immunizations Immunization History  Administered Date(s) Administered   Fluad Quad(high Dose 65+) 06/27/2020, 06/09/2021   Influenza, High Dose Seasonal PF 06/14/2019   Influenza-Unspecified 03/16/2014   PFIZER Comirnaty(Gray Top)Covid-19 Tri-Sucrose Vaccine 01/21/2021, 07/02/2021   PFIZER(Purple Top)SARS-COV-2 Vaccination 08/28/2019, 09/17/2019, 05/20/2020   Pneumococcal Conjugate-13 06/24/2020   Pneumococcal Polysaccharide-23 03/14/2014, 04/13/2018   Td 08/16/1996   Tdap 04/21/2021   Zoster Recombinat (Shingrix) 04/21/2021, 07/02/2021    TDAP status: Up to date  Flu Vaccine status: Up to date  Pneumococcal vaccine status: Up to date  Covid-19 vaccine status: Completed vaccines  Qualifies for Shingles Vaccine? Yes   Zostavax completed Yes   Shingrix Completed?: Yes  Screening Tests Health Maintenance  Topic Date Due   COVID-19 Vaccine (6 - Booster for Pfizer series) 08/27/2021   TETANUS/TDAP  04/22/2031   Pneumonia Vaccine 85+ Years old  Completed   INFLUENZA VACCINE  Completed   Hepatitis C Screening  Completed   Zoster Vaccines- Shingrix  Completed   HPV VACCINES  Aged Out   COLONOSCOPY (Pts 45-22yrs Insurance coverage will need to be confirmed)  [place] Maintenance Due  Topic Date Due   COVID-19 Vaccine (6 - Booster for [place] series) 08/27/2021    Colorectal cancer screening: No longer required.   Lung Cancer Screening: (Low  Dose CT Chest recommended if Age 50-80 years, 30 pack-year currently smoking OR have quit w/in 15years.) does not qualify.   Lung Cancer Screening Referral: no  Additional Screening:  Hepatitis C Screening: does qualify; Completed 05/11/2021  Vision Screening: Recommended annual ophthalmology exams for early detection of glaucoma and other disorders of the eye. Is the patient up to date with their annual eye exam?  No  Who is the provider or what is the name of the office in which the patient attends annual eye exams? [facility] If pt is not established with a provider, would they like to be referred to a provider to establish care? No .   Dental Screening: Recommended annual dental exams for proper oral hygiene  Community Resource Referral / Chronic Care Management: CRR required this visit?  No   CCM required this visit?  No      Plan:     I have personally reviewed and noted the following in the patients chart:   Medical and social history Use of alcohol, tobacco or illicit drugs  Current medications and supplements including opioid prescriptions. Patient is not currently taking opioid prescriptions. Functional ability and status Nutritional status Physical activity Advanced directives List of other physicians Hospitalizations, surgeries, and ER visits in previous 12 months Vitals Screenings to include cognitive, depression, and falls Referrals and appointments  In addition, I have reviewed and discussed with patient certain preventive protocols, quality metrics, and best practice recommendations. A written personalized care plan for preventive services as well as general preventive health recommendations were provided to patient.     Kellie Simmering, LPN   03/25/1750   Nurse Notes: none

## 2021-09-03 NOTE — Patient Instructions (Signed)
Kevin Weiss , Thank you for taking time to come for your Medicare Wellness Visit. I appreciate your ongoing commitment to your health goals. Please review the following plan we discussed and let me know if I can assist you in the future.   Screening recommendations/referrals: Colonoscopy: not required Recommended yearly ophthalmology/optometry visit for glaucoma screening and checkup Recommended yearly dental visit for hygiene and checkup  Vaccinations: Influenza vaccine: completed 06/09/2021 Pneumococcal vaccine: completed 06/24/2020 Tdap vaccine: completed 04/21/2021, due 04/22/2031 Shingles vaccine: completed   Covid-19:  07/02/2021, 01/21/2021, 05/20/2020, 09/17/2019, 08/28/2019  Advanced directives: Please bring a copy of your POA (Power of Attorney) and/or Living Will to your next appointment.   Conditions/risks identified: none  Next appointment: Follow up in one year for your annual wellness visit.   Preventive Care 40 Years and Older, Male Preventive care refers to lifestyle choices and visits with your health care provider that can promote health and wellness. What does preventive care include? A yearly physical exam. This is also called an annual well check. Dental exams once or twice a year. Routine eye exams. Ask your health care provider how often you should have your eyes checked. Personal lifestyle choices, including: Daily care of your teeth and gums. Regular physical activity. Eating a healthy diet. Avoiding tobacco and drug use. Limiting alcohol use. Practicing safe sex. Taking low doses of aspirin every day. Taking vitamin and mineral supplements as recommended by your health care provider. What happens during an annual well check? The services and screenings done by your health care provider during your annual well check will depend on your age, overall health, lifestyle risk factors, and family history of disease. Counseling  Your health care provider may ask you  questions about your: Alcohol use. Tobacco use. Drug use. Emotional well-being. Home and relationship well-being. Sexual activity. Eating habits. History of falls. Memory and ability to understand (cognition). Work and work Statistician. Screening  You may have the following tests or measurements: Height, weight, and BMI. Blood pressure. Lipid and cholesterol levels. These may be checked every 5 years, or more frequently if you are over 9 years old. Skin check. Lung cancer screening. You may have this screening every year starting at age 26 if you have a 30-pack-year history of smoking and currently smoke or have quit within the past 15 years. Fecal occult blood test (FOBT) of the stool. You may have this test every year starting at age 33. Flexible sigmoidoscopy or colonoscopy. You may have a sigmoidoscopy every 5 years or a colonoscopy every 10 years starting at age 47. Prostate cancer screening. Recommendations will vary depending on your family history and other risks. Hepatitis C blood test. Hepatitis B blood test. Sexually transmitted disease (STD) testing. Diabetes screening. This is done by checking your blood sugar (glucose) after you have not eaten for a while (fasting). You may have this done every 1-3 years. Abdominal aortic aneurysm (AAA) screening. You may need this if you are a current or former smoker. Osteoporosis. You may be screened starting at age 19 if you are at high risk. Talk with your health care provider about your test results, treatment options, and if necessary, the need for more tests. Vaccines  Your health care provider may recommend certain vaccines, such as: Influenza vaccine. This is recommended every year. Tetanus, diphtheria, and acellular pertussis (Tdap, Td) vaccine. You may need a Td booster every 10 years. Zoster vaccine. You may need this after age 34. Pneumococcal 13-valent conjugate (PCV13) vaccine. One dose  is recommended after age  47. Pneumococcal polysaccharide (PPSV23) vaccine. One dose is recommended after age 104. Talk to your health care provider about which screenings and vaccines you need and how often you need them. This information is not intended to replace advice given to you by your health care provider. Make sure you discuss any questions you have with your health care provider. Document Released: 08/29/2015 Document Revised: 04/21/2016 Document Reviewed: 06/03/2015 Elsevier Interactive Patient Education  2017 [place] Prevention in the Home Falls can cause injuries. They can happen to people of all ages. There are many things you can do to make your home safe and to help prevent falls. What can I do on the outside of my home? Regularly fix the edges of walkways and driveways and fix any cracks. Remove anything that might make you trip as you walk through a door, such as a raised step or threshold. Trim any bushes or trees on the path to your home. Use bright outdoor lighting. Clear any walking paths of anything that might make someone trip, such as rocks or tools. Regularly check to see if handrails are loose or broken. Make sure that both sides of any steps have handrails. Any raised decks and porches should have guardrails on the edges. Have any leaves, snow, or ice cleared regularly. Use sand or salt on walking paths during winter. Clean up any spills in your garage right away. This includes oil or grease spills. What can I do in the bathroom? Use night lights. Install grab bars by the toilet and in the tub and shower. Do not use towel bars as grab bars. Use non-skid mats or decals in the tub or shower. If you need to sit down in the shower, use a plastic, non-slip stool. Keep the floor dry. Clean up any water that spills on the floor as soon as it happens. Remove soap buildup in the tub or shower regularly. Attach bath mats securely with double-sided non-slip rug tape. Do not have throw  rugs and other things on the floor that can make you trip. What can I do in the bedroom? Use night lights. Make sure that you have a light by your bed that is easy to reach. Do not use any sheets or blankets that are too big for your bed. They should not hang down onto the floor. Have a firm chair that has side arms. You can use this for support while you get dressed. Do not have throw rugs and other things on the floor that can make you trip. What can I do in the kitchen? Clean up any spills right away. Avoid walking on wet floors. Keep items that you use a lot in easy-to-reach places. If you need to reach something above you, use a strong step stool that has a grab bar. Keep electrical cords out of the way. Do not use floor polish or wax that makes floors slippery. If you must use wax, use non-skid floor wax. Do not have throw rugs and other things on the floor that can make you trip. What can I do with my stairs? Do not leave any items on the stairs. Make sure that there are handrails on both sides of the stairs and use them. Fix handrails that are broken or loose. Make sure that handrails are as long as the stairways. Check any carpeting to make sure that it is firmly attached to the stairs. Fix any carpet that is loose or worn. Avoid  having throw rugs at the top or bottom of the stairs. If you do have throw rugs, attach them to the floor with carpet tape. Make sure that you have a light switch at the top of the stairs and the bottom of the stairs. If you do not have them, ask someone to add them for you. What else can I do to help prevent falls? Wear shoes that: Do not have high heels. Have rubber bottoms. Are comfortable and fit you well. Are closed at the toe. Do not wear sandals. If you use a stepladder: Make sure that it is fully opened. Do not climb a closed stepladder. Make sure that both sides of the stepladder are locked into place. Ask someone to hold it for you, if  possible. Clearly mark and make sure that you can see: Any grab bars or handrails. First and last steps. Where the edge of each step is. Use tools that help you move around (mobility aids) if they are needed. These include: Canes. Walkers. Scooters. Crutches. Turn on the lights when you go into a dark area. Replace any light bulbs as soon as they burn out. Set up your furniture so you have a clear path. Avoid moving your furniture around. If any of your floors are uneven, fix them. If there are any pets around you, be aware of where they are. Review your medicines with your doctor. Some medicines can make you feel dizzy. This can increase your chance of falling. Ask your doctor what other things that you can do to help prevent falls. This information is not intended to replace advice given to you by your health care provider. Make sure you discuss any questions you have with your health care provider. Document Released: 05/29/2009 Document Revised: 01/08/2016 Document Reviewed: 09/06/2014 Elsevier Interactive Patient Education  2017 [employer].

## 2021-09-04 ENCOUNTER — Other Ambulatory Visit: Payer: Self-pay | Admitting: Nurse Practitioner

## 2021-09-04 MED ORDER — HYDROCORTISONE 2.5 % EX OINT: TOPICAL_OINTMENT | Freq: Two times a day (BID) | CUTANEOUS | 0 refills | Status: DC

## 2021-09-15 DIAGNOSIS — D508 Other iron deficiency anemias: Secondary | ICD-10-CM

## 2021-09-15 DIAGNOSIS — I10 Essential (primary) hypertension: Secondary | ICD-10-CM

## 2021-09-15 DIAGNOSIS — E782 Mixed hyperlipidemia: Secondary | ICD-10-CM

## 2021-10-06 ENCOUNTER — Telehealth: Payer: Self-pay

## 2021-10-06 ENCOUNTER — Other Ambulatory Visit: Payer: Self-pay | Admitting: Nurse Practitioner

## 2021-10-06 DIAGNOSIS — E782 Mixed hyperlipidemia: Secondary | ICD-10-CM

## 2021-10-06 NOTE — Chronic Care Management (AMB) (Addendum)
Chronic Care Management Pharmacy Assistant   Name: Kevin Weiss  MRN: [id number] DOB: Jan 31, 1941  Reason for Encounter: Disease State/ Hypertension  Recent office visits:  09-03-2021 Kellie Simmering, LPN. Medicare annual wellness visit.  08-20-2021 Little, Claudette Stapler, RN (CCM)  Recent consult visits:  08-12-2021 Wallene Huh, DPM (Podiatry). Compression stockings given for swelling. debrided nailbeds 1-5 both feet.  Hospital visits:  None in previous 6 months  Medications: Outpatient Encounter Medications as of 10/06/2021  Medication Sig   hydrocortisone 2.5 % ointment Apply topically 2 (two) times daily.   lisinopril (ZESTRIL) 10 MG tablet TAKE 1 TABLET BY MOUTH EVERY DAY   rosuvastatin (CRESTOR) 10 MG tablet TAKE 1 TABLET BY MOUTH ON Monday, Wednesday AND FRIDAYS   aspirin 81 MG tablet Take 81 mg by mouth daily.   Cholecalciferol (VITAMIN D-3) 1000 UNITS CAPS Take 1 capsule by mouth daily.   ferrous sulfate 325 (65 FE) MG tablet TAKE 1 TABLET (325 MG TOTAL) BY MOUTH 2 (TWICE) TIMES DAILY WITH MEALS.   hydrochlorothiazide (MICROZIDE) 12.5 MG capsule TAKE 1 CAPSULE BY MOUTH EVERY DAY   linaclotide (LINZESS) 145 MCG CAPS capsule Take 1 capsule (145 mcg total) by mouth daily before breakfast.   Multiple Vitamins-Minerals (CENTRUM SILVER ADULT 50+) TABS Take 1 tablet by mouth daily.   sildenafil (VIAGRA) 100 MG tablet Take 100 mg by mouth daily as needed.   Vibegron (GEMTESA) 75 MG TABS Take by mouth.   No facility-administered encounter medications on file as of 10/06/2021.   Reviewed chart prior to disease state call. Spoke with patient regarding BP  Recent Office Vitals: BP Readings from Last 3 Encounters:  08/05/21 132/84  06/29/21 130/72  06/09/21 136/74   Pulse Readings from Last 3 Encounters:  08/05/21 94  06/29/21 80  06/09/21 70    Wt Readings from Last 3 Encounters:  09/03/21 245 lb (111.1 kg)  08/05/21 250 lb 3.2 oz (113.5 kg)  06/29/21 255 lb  (115.7 kg)     Kidney Function Lab Results  Component Value Date/Time   CREATININE 1.16 08/05/2021 03:58 PM   CREATININE 1.16 04/14/2021 12:25 PM   GFRNONAA 51 (L) 09/10/2020 04:58 PM   GFRAA 59 (L) 09/10/2020 04:58 PM    BMP Latest Ref Rng & Units 08/05/2021 04/14/2021 11/12/2020  Glucose 70 - 99 mg/dL 88 91 77  BUN 8 - 27 mg/dL 17 14 13   Creatinine 0.76 - 1.27 mg/dL 1.16 1.16 1.37(H)  BUN/Creat Ratio 10 - 24 15 12  9(L)  Sodium 134 - 144 mmol/L 137 139 137  Potassium 3.5 - 5.2 mmol/L 3.9 4.1 4.4  Chloride 96 - 106 mmol/L 97 100 98  CO2 20 - 29 mmol/L 27 23 20   Calcium 8.6 - 10.2 mg/dL 9.9 10.2 10.0    Current antihypertensive regimen:  HCTZ 12.5 mg daily Lisinopril 10 mg daily  How often are you checking your Blood Pressure? 1-2x per week  Current home BP readings: 130/74  What recent interventions/DTPs have been made by any provider to improve Blood Pressure control since last CPP Visit:  Educated on Daily salt intake goal < 2300 mg; Importance of home blood pressure monitoring; Proper BP monitoring technique; -Counseled to monitor BP at home at least once per week, document, and provide log at future appointments -Recommended to continue current medication  Any recent hospitalizations or ED visits since last visit with CPP? No  What diet changes have been made to improve Blood Pressure Control?  Patient  stated he limits salt intake and drinks plenty of water.  What exercise is being done to improve your Blood Pressure Control?  Patient stated he is active daily around the house.  Adherence Review: Is the patient currently on ACE/ARB medication? Yes Does the patient have >5 day gap between last estimated fill dates? No  NOTES: Patient stated he hasn't heard anything from [place]. Contacted my abbie assist and was told application was updated in system today and application was denied. Scheduled a call back for reason of denial.  10-08-2021: Never received a call  back from [place] patient assistance. Informed Pattricia Boss PTM that fax should be coming for denial. Dia Sitter CMA has sampkes ready for patient to pick up until we find out the reson for denial. Patient informed.  Care Gaps: Covid booster overdue AWV 09-09-2022  Star Rating Drugs: Lisinopril 10 mg- Last filled 10-06-2021 [facility] Rosuvastatin 10 mg- Last filled 10-06-2021 [facility]  [place] Clinical Pharmacist Assistant [phone]

## 2021-10-08 ENCOUNTER — Other Ambulatory Visit: Payer: Self-pay

## 2021-10-08 MED ORDER — LINACLOTIDE 145 MCG PO CAPS: 145.0000 ug | ORAL_CAPSULE | Freq: Every day | ORAL | 2 refills | Status: DC

## 2021-11-09 ENCOUNTER — Other Ambulatory Visit: Payer: Self-pay | Admitting: Nurse Practitioner

## 2021-11-09 DIAGNOSIS — I10 Essential (primary) hypertension: Secondary | ICD-10-CM

## 2021-11-11 ENCOUNTER — Telehealth: Payer: Medicare HMO

## 2021-11-11 ENCOUNTER — Ambulatory Visit (INDEPENDENT_AMBULATORY_CARE_PROVIDER_SITE_OTHER): Payer: Medicare HMO | Admitting: Podiatry

## 2021-11-11 DIAGNOSIS — B351 Tinea unguium: Secondary | ICD-10-CM | POA: Diagnosis not present

## 2021-11-11 DIAGNOSIS — M79674 Pain in right toe(s): Secondary | ICD-10-CM

## 2021-11-11 DIAGNOSIS — R600 Localized edema: Secondary | ICD-10-CM

## 2021-11-11 DIAGNOSIS — M79675 Pain in left toe(s): Secondary | ICD-10-CM | POA: Diagnosis not present

## 2021-11-11 NOTE — Progress Notes (Signed)
This patient returns to the office for evaluation and treatment of long thick painful nails .  This patient is unable to trim his own nails since the patient cannot reach his feet.  Patient says the nails are painful walking and wearing his shoes.  He returns for preventive foot care services.  He is concerned about swelling in his right foot.  He says he was seen by another doctor and treated with anklet to reduce swelling.  He says the reason for his swelling was never discussed.  He desires evaluation and treatment of his swollen foot.   ? ?General Appearance  Alert, conversant and in no acute stress. ? ?Vascular  Dorsalis pedis and posterior tibial  pulses are palpable  bilaterally.  Capillary return is within normal limits  bilaterally. Temperature is within normal limits  bilaterally. ? ?Neurologic  Senn-Weinstein monofilament wire test within normal limits  bilaterally. Muscle power within normal limits bilaterally. ? ?Nails Thick disfigured discolored nails with subungual debris  from hallux to fifth toes bilaterally. No evidence of bacterial infection or drainage bilaterally. ? ?Orthopedic  No limitations of motion  feet .  No crepitus or effusions noted.  No bony pathology or digital deformities noted. ? ?Skin  normotropic skin with no porokeratosis noted bilaterally.  No signs of infections or ulcers noted.    ? ?Onychomycosis  Pain in toes right foot  Pain in toes left foot  Swelling right foot. ? ?Debridement  of nails  1-5  B/L with a nail nipper.  Nails were then filed using a dremel tool with no incidents. Discussed swelling with this patient.  Told him that his foot is not swollen and the same size as his left foot.  No increased temperature in right foot.  Told him to use anklet and elevate his foot.  Discussed swelling with this patient.   RTC  3 months ? ? ?Gardiner Barefoot DPM   ?

## 2021-12-09 ENCOUNTER — Encounter: Payer: Self-pay | Admitting: Nurse Practitioner

## 2021-12-09 ENCOUNTER — Ambulatory Visit (INDEPENDENT_AMBULATORY_CARE_PROVIDER_SITE_OTHER): Payer: Medicare HMO | Admitting: Nurse Practitioner

## 2021-12-09 VITALS — BP 132/70 | HR 69 | Temp 98.1°F | Ht 67.5 in | Wt 251.0 lb

## 2021-12-09 DIAGNOSIS — Z6838 Body mass index (BMI) 38.0-38.9, adult: Secondary | ICD-10-CM

## 2021-12-09 DIAGNOSIS — R7303 Prediabetes: Secondary | ICD-10-CM

## 2021-12-09 DIAGNOSIS — I5032 Chronic diastolic (congestive) heart failure: Secondary | ICD-10-CM

## 2021-12-09 DIAGNOSIS — I110 Hypertensive heart disease with heart failure: Secondary | ICD-10-CM | POA: Diagnosis not present

## 2021-12-09 DIAGNOSIS — R5383 Other fatigue: Secondary | ICD-10-CM | POA: Diagnosis not present

## 2021-12-09 DIAGNOSIS — E782 Mixed hyperlipidemia: Secondary | ICD-10-CM | POA: Diagnosis not present

## 2021-12-09 NOTE — Progress Notes (Signed)
?Industrial/product designer as a Education administrator for [employer], FNP.,have documented all relevant documentation on the behalf of Kevin Brine, FNP,as directed by  Kevin Brine, FNP while in the presence of Kevin Weiss, Kevin Weiss. ? ?This visit occurred during the SARS-CoV-2 public health emergency.  Safety protocols were in place, including screening questions prior to the visit, additional usage of staff PPE, and extensive cleaning of exam room while observing appropriate contact time as indicated for disinfecting solutions. ? ?Subjective:  ?  ? Patient ID: Kevin Weiss , male    DOB: [birth year] , 81 y.o.   MRN: [id number] ? ? ?Chief Complaint  ?Patient presents with  ? Hypertension  ? ? ?HPI ? ?Pt here today for hypertension f/u pt is compilant with all meds. He has seen the podiatrist who has trimmed his toenails. He is not having pain to either of his ankles. Does not stop him from doing anything with the left foot swelling. He has come for a lipo b which helped him with his energy. He came for the second one was not as effective.  ? ?Hypertension ?This is a chronic problem. The current episode started more than 1 year ago. Pertinent negatives include no headaches. There are no associated agents to hypertension. Risk factors for coronary artery disease include obesity and sedentary lifestyle. There are no compliance problems.  There is no history of chronic renal disease.   ? ?Past Medical History:  ?Diagnosis Date  ? Arthritis   ? Cardiomegaly 10/13/2006  ? Qualifier: Diagnosis of  By: Kevin Weiss    ? CHF, EJECTION FRACTION > OR = 50% 10/13/2006  ? Qualifier: Diagnosis of  By: Kevin Weiss    ? Degenerative joint disease (DJD) of hip 03/12/2014  ? GASTROESOPHAGEAL REFLUX, NO ESOPHAGITIS 10/13/2006  ? patient denies  ? HEMORRHOIDS, NOS 10/13/2006  ? Qualifier: Diagnosis of  By: Kevin Weiss    ? History of colon polyps   ? Hypertension   ? takes Amlodipine and Prinizide daily  ? 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 Qualifier: Diagnosis of  By: Kevin Weiss    ? Morbid obesity ([place]) 11/03/2015  ? OSA on CPAP 11/03/2015  ? Retrognathia 11/03/2015  ?  ? ?Family History  ?Problem Relation Age of Onset  ? Hypertension Mother   ? Hyperlipidemia Mother   ? Alzheimer's disease Mother   ? Cancer Mother   ?     type unbknown  ? Heart disease Father   ? Hyperlipidemia Father   ? Hypertension Father   ? Hypertension Sister   ? Hypertension Brother   ? Heart attack Maternal Grandmother   ? ? ? ?Current Outpatient Medications:  ?  aspirin 81 MG tablet, Take 81 mg by mouth daily., Disp: , Rfl:  ?  Cholecalciferol (VITAMIN D-3) 1000 UNITS CAPS, Take 1 capsule by mouth daily., Disp: , Rfl:  ?  ferrous sulfate 325 (65 FE) MG tablet, TAKE 1 TABLET (325 MG TOTAL) BY MOUTH 2 (TWICE) TIMES DAILY WITH MEALS., Disp: 180 tablet, Rfl: 1 ?  hydrochlorothiazide (MICROZIDE) 12.5 MG capsule, TAKE 1 CAPSULE BY MOUTH EVERY DAY, Disp: 90 capsule, Rfl: 0 ?  hydrocortisone 2.5 % ointment, Apply topically 2 (two) times daily., Disp: 30 g, Rfl: 0 ?  linaclotide (LINZESS) 145 MCG CAPS capsule, Take 1 capsule (145 mcg total) by mouth daily before breakfast., Disp: 90 capsule, Rfl: 2 ?  lisinopril (ZESTRIL) 10 MG tablet, TAKE 1 TABLET BY MOUTH EVERY DAY, Disp: 90 tablet, Rfl: 0 ?  Multiple Vitamins-Minerals (CENTRUM SILVER ADULT 50+) TABS, Take 1 tablet by mouth daily., Disp: , Rfl:  ?  rosuvastatin (CRESTOR) 10 MG tablet, TAKE 1 TABLET BY MOUTH ON Monday, Wednesday AND FRIDAYS, Disp: 90 tablet, Rfl: 1 ?  sildenafil (VIAGRA) 100 MG tablet, Take 100 mg by mouth daily as needed., Disp: , Rfl:  ?  Vibegron (GEMTESA) 75 MG TABS, Take by mouth., Disp: , Rfl:   ? ?No Known Allergies  ? ?Review of Systems  ?Constitutional: Negative.   ?Respiratory: Negative.    ?Cardiovascular: Negative.   ?Gastrointestinal: Negative.   ?Neurological: Negative.  Negative for headaches.  ?Psychiatric/Behavioral: Negative.     ? ?Today's Vitals  ? 12/09/21 1102  ?BP: 132/70  ?Pulse: 69   ?Temp: 98.1 ?F (36.7 ?C)  ?TempSrc: Oral  ?Weight: 251 lb (113.9 kg)  ?Height: 5' 7.5" (1.715 m)  ? ?Body mass index is 38.73 kg/m?.  ?Wt Readings from Last 3 Encounters:  ?12/09/21 251 lb (113.9 kg)  ?09/03/21 245 lb (111.1 kg)  ?08/05/21 250 lb 3.2 oz (113.5 kg)  ? ? ?Objective:  ?Physical Exam ?Vitals reviewed.  ?Constitutional:   ?   General: He is not in acute distress. ?   Appearance: Normal appearance. He is obese.  ?HENT:  ?   Head: Normocephalic.  ?Eyes:  ?   Extraocular Movements: Extraocular movements intact.  ?   Conjunctiva/sclera: Conjunctivae normal.  ?   Pupils: Pupils are equal, round, and reactive to light.  ?Cardiovascular:  ?   Rate and Rhythm: Normal rate and regular rhythm.  ?   Pulses: Normal pulses.  ?   Heart sounds: Normal heart sounds. No murmur heard. ?Pulmonary:  ?   Effort: Pulmonary effort is normal. No respiratory distress.  ?   Breath sounds: Normal breath sounds. No wheezing.  ?Musculoskeletal:     ?   General: Swelling (trace to left ankle) present. No tenderness.  ?Skin: ?   General: Skin is warm and dry.  ?   Capillary Refill: Capillary refill takes less than 2 seconds.  ?   Comments: Thickened and darkened toenails. He also clipped some of his skin to left great toe  ?Neurological:  ?   General: No focal deficit present.  ?   Mental Status: He is alert and oriented to person, place, and time.  ?   Cranial Nerves: No cranial nerve deficit.  ?   Motor: No weakness.  ?Psychiatric:     ?   Mood and Affect: Mood normal.     ?   Behavior: Behavior normal.     ?   Thought Content: Thought content normal.     ?   Judgment: Judgment normal.  ?  ? ?   ?Assessment And Plan:  ?   ?1. Hypertensive heart disease with chronic diastolic congestive heart failure ([place]) ?Comments: Blood pressure is controlled, continue current medications. Will check ECHO has not been done since 2019.  ?- BMP8+EGFR ?- ECHOCARDIOGRAM COMPLETE; Future ? ?2. Prediabetes ?Comments: Stable, continue with healthy diet.   ?- Hemoglobin A1c ? ?3. Mixed hyperlipidemia ?Comments: Stable, continue statin. tolerating well.  ?- Lipid panel ? ?4. Other fatigue ?Comments: Will check vitamin d level.  ?- VITAMIN D 25 Hydroxy (Vit-D Deficiency, Fractures) ? ?5. Class 2 severe obesity due to excess calories with serious comorbidity and body mass index (BMI) of 38.0 to 38.9 in adult [facility]) ? He is encouraged to initially strive for BMI less than 30 to decrease cardiac risk.  He is advised to exercise no less than 150 minutes per week.  ? ? ? ?Patient was given opportunity to ask questions. Patient verbalized understanding of the plan and was able to repeat key elements of the plan. All questions were answered to their satisfaction.  ?Kevin Brine, FNP  ? ?I, Kevin Brine, FNP, have reviewed all documentation for this visit. The documentation on 12/09/21 for the exam, diagnosis, procedures, and orders are all accurate and complete.  ? ?IF YOU HAVE BEEN REFERRED TO A SPECIALIST, IT MAY TAKE 1-2 WEEKS TO SCHEDULE/PROCESS THE REFERRAL. IF YOU HAVE NOT HEARD FROM US/SPECIALIST IN TWO WEEKS, PLEASE GIVE [place] A CALL AT [phone].  ? ?THE PATIENT IS ENCOURAGED TO PRACTICE SOCIAL DISTANCING DUE TO THE COVID-19 PANDEMIC.   ?

## 2021-12-09 NOTE — Patient Instructions (Signed)
Hypertension, Adult High blood pressure (hypertension) is when the force of blood pumping through the arteries is too strong. The arteries are the blood vessels that carry blood from the heart throughout the body. Hypertension forces the heart to work harder to pump blood and may cause arteries to become narrow or stiff. Untreated or uncontrolled hypertension can lead to a heart attack, heart failure, a stroke, kidney disease, and other problems. A blood pressure reading consists of a higher number over a lower number. Ideally, your blood pressure should be below 120/80. The first ("top") number is called the systolic pressure. It is a measure of the pressure in your arteries as your heart beats. The second ("bottom") number is called the diastolic pressure. It is a measure of the pressure in your arteries as the heart relaxes. What are the causes? The exact cause of this condition is not known. There are some conditions that result in high blood pressure. What increases the risk? Certain factors may make you more likely to develop high blood pressure. Some of these risk factors are under your control, including: Smoking. Not getting enough exercise or physical activity. Being overweight. Having too much fat, sugar, calories, or salt (sodium) in your diet. Drinking too much alcohol. Other risk factors include: Having a personal history of heart disease, diabetes, high cholesterol, or kidney disease. Stress. Having a family history of high blood pressure and high cholesterol. Having obstructive sleep apnea. Age. The risk increases with age. What are the signs or symptoms? High blood pressure may not cause symptoms. Very high blood pressure (hypertensive crisis) may cause: Headache. Fast or irregular heartbeats (palpitations). Shortness of breath. Nosebleed. Nausea and vomiting. Vision changes. Severe chest pain, dizziness, and seizures. How is this diagnosed? This condition is diagnosed by  measuring your blood pressure while you are seated, with your arm resting on a flat surface, your legs uncrossed, and your feet flat on the floor. The cuff of the blood pressure monitor will be placed directly against the skin of your upper arm at the level of your heart. Blood pressure should be measured at least twice using the same arm. Certain conditions can cause a difference in blood pressure between your right and left arms. If you have a high blood pressure reading during one visit or you have normal blood pressure with other risk factors, you may be asked to: Return on a different day to have your blood pressure checked again. Monitor your blood pressure at home for 1 week or longer. If you are diagnosed with hypertension, you may have other blood or imaging tests to help your health care provider understand your overall risk for other conditions. How is this treated? This condition is treated by making healthy lifestyle changes, such as eating healthy foods, exercising more, and reducing your alcohol intake. You may be referred for counseling on a healthy diet and physical activity. Your health care provider may prescribe medicine if lifestyle changes are not enough to get your blood pressure under control and if: Your systolic blood pressure is above 130. Your diastolic blood pressure is above 80. Your personal target blood pressure may vary depending on your medical conditions, your age, and other factors. Follow these instructions at home: Eating and drinking  Eat a diet that is high in fiber and potassium, and low in sodium, added sugar, and fat. An example of this eating plan is called the DASH diet. DASH stands for Dietary Approaches to Stop Hypertension. To eat this way: Eat   plenty of fresh fruits and vegetables. Try to fill one half of your plate at each meal with fruits and vegetables. Eat whole grains, such as whole-wheat pasta, brown rice, or whole-grain bread. Fill about one  fourth of your plate with whole grains. Eat or drink low-fat dairy products, such as skim milk or low-fat yogurt. Avoid fatty cuts of meat, processed or cured meats, and poultry with skin. Fill about one fourth of your plate with lean proteins, such as fish, chicken without skin, beans, eggs, or tofu. Avoid pre-made and processed foods. These tend to be higher in sodium, added sugar, and fat. Reduce your daily sodium intake. Many people with hypertension should eat less than 1,500 mg of sodium a day. Do not drink alcohol if: Your health care provider tells you not to drink. You are pregnant, may be pregnant, or are planning to become pregnant. If you drink alcohol: Limit how much you have to: 0-1 drink a day for women. 0-2 drinks a day for men. Know how much alcohol is in your drink. In the [place] one drink equals one 12 oz bottle of beer (355 mL), one 5 oz glass of wine (148 mL), or one 1 oz glass of hard liquor (44 mL). Lifestyle  Work with your health care provider to maintain a healthy body weight or to lose weight. Ask what an ideal weight is for you. Get at least 30 minutes of exercise that causes your heart to beat faster (aerobic exercise) most days of the week. Activities may include walking, swimming, or biking. Include exercise to strengthen your muscles (resistance exercise), such as Pilates or lifting weights, as part of your weekly exercise routine. Try to do these types of exercises for 30 minutes at least 3 days a week. Do not use any products that contain nicotine or tobacco. These products include cigarettes, chewing tobacco, and vaping devices, such as e-cigarettes. If you need help quitting, ask your health care provider. Monitor your blood pressure at home as told by your health care provider. Keep all follow-up visits. This is important. Medicines Take over-the-counter and prescription medicines only as told by your health care provider. Follow directions carefully. Blood  pressure medicines must be taken as prescribed. Do not skip doses of blood pressure medicine. Doing this puts you at risk for problems and can make the medicine less effective. Ask your health care provider about side effects or reactions to medicines that you should watch for. Contact a health care provider if you: Think you are having a reaction to a medicine you are taking. Have headaches that keep coming back (recurring). Feel dizzy. Have swelling in your ankles. Have trouble with your vision. Get help right away if you: Develop a severe headache or confusion. Have unusual weakness or numbness. Feel faint. Have severe pain in your chest or abdomen. Vomit repeatedly. Have trouble breathing. These symptoms may be an emergency. Get help right away. Call 911. Do not wait to see if the symptoms will go away. Do not drive yourself to the hospital. Summary Hypertension is when the force of blood pumping through your arteries is too strong. If this condition is not controlled, it may put you at risk for serious complications. Your personal target blood pressure may vary depending on your medical conditions, your age, and other factors. For most people, a normal blood pressure is less than 120/80. Hypertension is treated with lifestyle changes, medicines, or a combination of both. Lifestyle changes include losing weight, eating a healthy,   low-sodium diet, exercising more, and limiting alcohol. This information is not intended to replace advice given to you by your health care provider. Make sure you discuss any questions you have with your health care provider. Document Revised: 06/09/2021 Document Reviewed: 06/09/2021 Elsevier Patient Education  2023 [employer] Inc.  

## 2021-12-10 LAB — BMP8+EGFR
BUN/Creatinine Ratio: 12 (ref 10–24)
BUN: 13 mg/dL (ref 8–27)
CO2: 24 mmol/L (ref 20–29)
Calcium: 10 mg/dL (ref 8.6–10.2)
Chloride: 101 mmol/L (ref 96–106)
Creatinine, Ser: 1.06 mg/dL (ref 0.76–1.27)
Glucose: 87 mg/dL (ref 70–99)
Potassium: 4.4 mmol/L (ref 3.5–5.2)
Sodium: 144 mmol/L (ref 134–144)
eGFR: 72 mL/min/{1.73_m2} (ref >59)

## 2021-12-10 LAB — LIPID PANEL
Chol/HDL Ratio: 3.5 ratio (ref 0.0–5.0)
Cholesterol, Total: 138 mg/dL (ref 100–199)
HDL: 40 mg/dL (ref >39)
LDL Chol Calc (NIH): 79 mg/dL (ref 0–99)
Triglycerides: 104 mg/dL (ref 0–149)
VLDL Cholesterol Cal: 19 mg/dL (ref 5–40)

## 2021-12-10 LAB — HEMOGLOBIN A1C
Est. average glucose Bld gHb Est-mCnc: 120 mg/dL
Hgb A1c MFr Bld: 5.8 % — ABNORMAL HIGH (ref 4.8–5.6)

## 2021-12-10 LAB — VITAMIN D 25 HYDROXY (VIT D DEFICIENCY, FRACTURES): Vit D, 25-Hydroxy: 42.7 ng/mL (ref 30.0–100.0)

## 2021-12-11 ENCOUNTER — Other Ambulatory Visit: Payer: Self-pay | Admitting: Nurse Practitioner

## 2021-12-11 ENCOUNTER — Telehealth: Payer: Self-pay

## 2021-12-11 ENCOUNTER — Telehealth: Payer: Medicare HMO

## 2021-12-11 DIAGNOSIS — D508 Other iron deficiency anemias: Secondary | ICD-10-CM

## 2021-12-11 NOTE — Telephone Encounter (Signed)
?  Care Management  ? ?Follow Up Note ? ? ?12/11/2021 ?Name: Kevin Weiss MRN: [id number] DOB: 1940/11/10 ? ? ?Referred by: [name], FNP ?Reason for referral : Chronic Care Management (RN CM Follow up call ) ? ? ?An unsuccessful telephone outreach was attempted today. The patient was referred to the case management team for assistance with care management and care coordination.  ? ?Follow Up Plan: A HIPPA compliant phone message was left for the patient providing contact information and requesting a return call.  ? ?Barb Merino, RN, BSN, CCM ?Care Management Coordinator ?[facility]  ?Direct Phone: [phone] ? ? ?

## 2021-12-24 ENCOUNTER — Ambulatory Visit (INDEPENDENT_AMBULATORY_CARE_PROVIDER_SITE_OTHER): Payer: Medicare HMO

## 2021-12-24 ENCOUNTER — Telehealth: Payer: Medicare HMO

## 2021-12-24 ENCOUNTER — Other Ambulatory Visit: Payer: Self-pay

## 2021-12-24 DIAGNOSIS — K5900 Constipation, unspecified: Secondary | ICD-10-CM

## 2021-12-24 DIAGNOSIS — R7303 Prediabetes: Secondary | ICD-10-CM

## 2021-12-24 DIAGNOSIS — I10 Essential (primary) hypertension: Secondary | ICD-10-CM

## 2021-12-24 DIAGNOSIS — D508 Other iron deficiency anemias: Secondary | ICD-10-CM

## 2021-12-24 DIAGNOSIS — E782 Mixed hyperlipidemia: Secondary | ICD-10-CM

## 2021-12-24 DIAGNOSIS — R748 Abnormal levels of other serum enzymes: Secondary | ICD-10-CM

## 2021-12-24 MED ORDER — FERROUS SULFATE 325 (65 FE) MG PO TABS: ORAL_TABLET | ORAL | 1 refills | Status: DC

## 2021-12-24 NOTE — Chronic Care Management (AMB) (Signed)
?Chronic Care Management  ? ?CCM RN Visit Note ? ?12/24/2021 ?Name: Kevin Weiss MRN: [id number] DOB: 1941/02/01 ? ?Subjective: ?Kevin Weiss is a 81 y.o. year old male who is a primary care patient of [name], [place]. The care management team was consulted for assistance with disease management and care coordination needs.   ? ?Engaged with patient by telephone for follow up visit in response to provider referral for case management and/or care coordination services.  ? ?Consent to Services:  ?The patient was given information about Chronic Care Management services, agreed to services, and gave verbal consent prior to initiation of services.  Please see initial visit note for detailed documentation.  ? ?Patient agreed to services and verbal consent obtained.  ? ?Assessment: Review of patient past medical history, allergies, medications, health status, including review of consultants reports, laboratory and other test data, was performed as part of comprehensive evaluation and provision of chronic care management services.  ? ?SDOH (Social Determinants of Health) assessments and interventions performed: Yes, no acute needs   ? ?CCM Care Plan ? ?No Known Allergies ? ?Outpatient Encounter Medications as of 12/24/2021  ?Medication Sig  ? Cholecalciferol (VITAMIN D-3) 1000 UNITS CAPS Take 1 capsule by mouth daily.  ? aspirin 81 MG tablet Take 81 mg by mouth daily.  ? hydrochlorothiazide (MICROZIDE) 12.5 MG capsule TAKE 1 CAPSULE BY MOUTH EVERY DAY  ? hydrocortisone 2.5 % ointment Apply topically 2 (two) times daily.  ? linaclotide (LINZESS) 145 MCG CAPS capsule Take 1 capsule (145 mcg total) by mouth daily before breakfast.  ? lisinopril (ZESTRIL) 10 MG tablet TAKE 1 TABLET BY MOUTH EVERY DAY  ? Multiple Vitamins-Minerals (CENTRUM SILVER ADULT 50+) TABS Take 1 tablet by mouth daily.  ? rosuvastatin (CRESTOR) 10 MG tablet TAKE 1 TABLET BY MOUTH ON Monday, Wednesday AND FRIDAYS  ? sildenafil (VIAGRA) 100 MG  tablet Take 100 mg by mouth daily as needed.  ? Vibegron (GEMTESA) 75 MG TABS Take by mouth.  ? [DISCONTINUED] ferrous sulfate 325 (65 FE) MG tablet TAKE 1 TABLET (325 MG TOTAL) BY MOUTH 2 (TWICE) TIMES DAILY WITH MEALS.  ? ?No facility-administered encounter medications on file as of 12/24/2021.  ? ? ?Patient Active Problem List  ? Diagnosis Date Noted  ? Prediabetes 02/02/2019  ? Primary osteoarthritis of left hip 04/11/2018  ? Preop cardiovascular exam 03/30/2018  ? Abnormal echocardiogram 03/30/2018  ? Obese abdomen 07/22/2016  ? Retrognathia 11/03/2015  ? Morbid obesity ([place]) 11/03/2015  ? Cardiac hypertrophy 11/03/2015  ? OSA on CPAP 11/03/2015  ? Degenerative joint disease (DJD) of hip 03/12/2014  ? OBESITY, NOS 10/13/2006  ? HYPERTENSION, BENIGN SYSTEMIC 10/13/2006  ? CHF, EJECTION FRACTION > OR = 50% 10/13/2006  ? CARDIOMEGALY 10/13/2006  ? HEMORRHOIDS, NOS 10/13/2006  ? GASTROESOPHAGEAL REFLUX, NO ESOPHAGITIS 10/13/2006  ? 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 Sleep apnea 10/13/2006  ? ? ?Conditions to be addressed/monitored: Prediabetes, HTN, Mixed Hyperlipidemia, Constipation, unspecified, Iron deficiency Anemia, Elevated alkaline phosphatase, Obesity ? ?Care Plan : RN Care Manager Plan of Care  ?Updates made by Lynne Logan, RN since 12/24/2021 12:00 AM  ?  ? ?Problem: No plan of care esatblished for management of chronic disease states (Prediabetes, HTN, Mixed Hyperlipidemia, Constipation, unspecified, Iron deficiency Anemia, Elevated alkaline phosphatase, Obesity)   ?Priority: High  ?  ? ?Long-Range Goal: Establishment of plan of care for management of chronic disease states (Prediabetes, HTN, Mixed Hyperlipidemia, Constipation, unspecified, Iron deficiency Anemia, Elevated alkaline phosphatase, Obesity)   ?  Start Date: 08/21/2021  ?Expected End Date: 08/21/2022  ?Recent Progress: On track  ?Priority: High  ?Note:   ?Current Barriers:  ?Knowledge Deficits related to plan of care for management of Prediabetes,  HTN, Mixed Hyperlipidemia, Constipation, unspecified, Iron deficiency Anemia, Elevated alkaline phosphatase, Obesity  ?Chronic Disease Management support and education needs related to Prediabetes, HTN, Mixed Hyperlipidemia, Constipation, unspecified, Iron deficiency Anemia, Elevated alkaline phosphatase, Obesity  ? ?RNCM Clinical Goal(s):  ?Patient will verbalize basic understanding of  Prediabetes, HTN, Mixed Hyperlipidemia, Constipation, unspecified, Iron deficiency Anemia, Elevated alkaline phosphatase, Obesity disease process and self health management plan as evidenced by patient will report having no exacerbations related to chronic disease states  ?take all medications exactly as prescribed and will call provider for medication related questions as evidenced by patient will report having no missed doses of his prescribed medications  ?demonstrate Ongoing health management independence as evidenced by patient will report 100% adherence to his prescribed treatment plan  ?continue to work with RN Care Manager to address care management and care coordination needs related to  Prediabetes, HTN, Mixed Hyperlipidemia, Constipation, unspecified, Iron deficiency Anemia, Elevated alkaline phosphatase, Obesity as evidenced by adherence to CM Team Scheduled appointments ?demonstrate ongoing self health care management ability   as evidenced by    through collaboration with RN Care manager, provider, and care team.  ? ?Interventions: ?1:1 collaboration with primary care provider regarding development and update of comprehensive plan of care as evidenced by provider attestation and co-signature ?Inter-disciplinary care team collaboration (see longitudinal plan of care) ?Evaluation of current treatment plan related to  self management and patient's adherence to plan as established by provider ? ? Chronic Kidney Disease Interventions:  (Status:  Goal on track:  Yes.) Long Term Goal ?Assessed the Patient understanding of  chronic kidney disease    ?Reviewed prescribed diet increase water to 64 oz daily unless otherwise directed  ?Provided education on kidney disease progression    ?Discussed plans with patient for ongoing care management follow up and provided patient with direct contact information for care management team ?Last practice recorded BP readings:  ?BP Readings from Last 3 Encounters:  ?12/09/21 132/70  ?08/05/21 132/84  ?06/29/21 130/72  ?Most recent eGFR/CrCl:  ?Lab Results  ?Component Value Date  ? EGFR 72 12/09/2021  ?  No components found for: CRCL ?Most recent eGFR/CrCl:  ?Lab Results  ?Component Value Date  ? EGFR 64 08/05/2021  ?  No components found for: CRCL ? ?Chronic Constipation Interventions:  (Status:  Condition stable.  Not addressed this visit.)  Long Term Goal ?Evaluation of current treatment plan related to  Chronic Constipation , self-management and patient's adherence to plan as established by provider ?Reviewed medications with patient and discussed importance of medication adherence, including use of Linzess, patient reports good effectiveness from use of this medication  ?Educated patient while providing rationale regarding the effectiveness  ?Discussed plans with patient for ongoing care management follow up and provided patient with direct contact information for care management team ? ?Hyperlipidemia Interventions:  (Status:  Goal on track:  Yes.) Long Term Goal ?Evaluation of current treatment plan related to Mixed Hyperlipidemia, self-management and patient's adherence to plan as established by provider  ?Provider established cholesterol goals reviewed ?Counseled on importance of regular laboratory monitoring as prescribed ?Reviewed importance of limiting foods high in cholesterol ?Reviewed exercise goals and target of 150 minutes per week  ?Discussed plans with patient for ongoing care management follow up and provided patient with direct contact  information for care management team ?Lipid  Panel  ?   ?Component Value Date/Time  ? CHOL 138 12/09/2021 1228  ? TRIG 104 12/09/2021 1228  ? HDL 40 12/09/2021 1228  ? CHOLHDL 3.5 12/09/2021 1228  ? LDLCALC 79 12/09/2021 1228  ? LABVLDL 19 12/09/2021 1

## 2021-12-24 NOTE — Patient Instructions (Signed)
Visit Information ? ?Thank you for taking time to visit with me today. Please don't hesitate to contact me if I can be of assistance to you before our next scheduled telephone appointment. ? ?Following are the goals we discussed today:  ?(Copy and paste patient goals from clinical care plan here) ? ?Our next appointment is by telephone on 04/20/22 at 10:30 AM ? ?Please call the care guide team at [phone] if you need to cancel or reschedule your appointment.  ? ?If you are experiencing a Mental Health or [place] or need someone to talk to, please call 1-800-273-TALK (toll free, 24 hour hotline)  ? ?Patient verbalizes understanding of instructions and care plan provided today and agrees to view in [place]. Active MyChart status confirmed with patient.   ? ?Barb Merino, RN, BSN, CCM ?Care Management Coordinator ?[facility]  ?Direct Phone: [phone] ? ? ?

## 2022-01-03 ENCOUNTER — Other Ambulatory Visit: Payer: Self-pay | Admitting: Nurse Practitioner

## 2022-01-06 ENCOUNTER — Ambulatory Visit (HOSPITAL_COMMUNITY): Payer: Medicare HMO | Attending: Cardiovascular Disease

## 2022-01-06 DIAGNOSIS — I110 Hypertensive heart disease with heart failure: Secondary | ICD-10-CM | POA: Diagnosis present

## 2022-01-06 DIAGNOSIS — I5032 Chronic diastolic (congestive) heart failure: Secondary | ICD-10-CM | POA: Insufficient documentation

## 2022-01-06 LAB — ECHOCARDIOGRAM COMPLETE
AR max vel: 1.81 cm2
AV Area VTI: 1.85 cm2
AV Area mean vel: 1.99 cm2
AV Mean grad: 9.5 mmHg
AV Peak grad: 17.6 mmHg
Ao pk vel: 2.1 m/s
Area-P 1/2: 3.16 cm2
P 1/2 time: 511 msec
S' Lateral: 3 cm

## 2022-01-13 DIAGNOSIS — I10 Essential (primary) hypertension: Secondary | ICD-10-CM

## 2022-01-13 DIAGNOSIS — E782 Mixed hyperlipidemia: Secondary | ICD-10-CM

## 2022-01-13 DIAGNOSIS — D508 Other iron deficiency anemias: Secondary | ICD-10-CM

## 2022-02-01 ENCOUNTER — Other Ambulatory Visit: Payer: Self-pay | Admitting: Nurse Practitioner

## 2022-02-01 DIAGNOSIS — I110 Hypertensive heart disease with heart failure: Secondary | ICD-10-CM

## 2022-02-12 ENCOUNTER — Encounter: Payer: Self-pay | Admitting: Nurse Practitioner

## 2022-02-17 ENCOUNTER — Other Ambulatory Visit: Payer: Self-pay | Admitting: Nurse Practitioner

## 2022-02-17 DIAGNOSIS — I10 Essential (primary) hypertension: Secondary | ICD-10-CM

## 2022-03-16 ENCOUNTER — Other Ambulatory Visit: Payer: Self-pay | Admitting: Nurse Practitioner

## 2022-03-16 DIAGNOSIS — I10 Essential (primary) hypertension: Secondary | ICD-10-CM

## 2022-04-07 NOTE — Progress Notes (Signed)
This encounter was created in error - please disregard.

## 2022-04-15 ENCOUNTER — Encounter: Payer: Self-pay | Admitting: Nurse Practitioner

## 2022-04-15 ENCOUNTER — Ambulatory Visit (INDEPENDENT_AMBULATORY_CARE_PROVIDER_SITE_OTHER): Payer: Medicare HMO | Admitting: Nurse Practitioner

## 2022-04-15 VITALS — BP 138/80 | HR 87 | Temp 98.2°F | Ht 67.5 in | Wt 253.0 lb

## 2022-04-15 DIAGNOSIS — Z0000 Encounter for general adult medical examination without abnormal findings: Secondary | ICD-10-CM | POA: Diagnosis not present

## 2022-04-15 DIAGNOSIS — I5032 Chronic diastolic (congestive) heart failure: Secondary | ICD-10-CM

## 2022-04-15 DIAGNOSIS — E782 Mixed hyperlipidemia: Secondary | ICD-10-CM

## 2022-04-15 DIAGNOSIS — I110 Hypertensive heart disease with heart failure: Secondary | ICD-10-CM

## 2022-04-15 DIAGNOSIS — R7303 Prediabetes: Secondary | ICD-10-CM | POA: Diagnosis not present

## 2022-04-15 DIAGNOSIS — I10 Essential (primary) hypertension: Secondary | ICD-10-CM

## 2022-04-15 DIAGNOSIS — M7989 Other specified soft tissue disorders: Secondary | ICD-10-CM

## 2022-04-15 DIAGNOSIS — Z6839 Body mass index (BMI) 39.0-39.9, adult: Secondary | ICD-10-CM

## 2022-04-15 LAB — POCT URINALYSIS DIPSTICK
Bilirubin, UA: NEGATIVE
Blood, UA: NEGATIVE
Glucose, UA: NEGATIVE
Ketones, UA: NEGATIVE
Leukocytes, UA: NEGATIVE
Nitrite, UA: NEGATIVE
Protein, UA: NEGATIVE
Spec Grav, UA: >=1.03 — AB (ref 1.010–1.025)
Urobilinogen, UA: 0.2 E.U./dL
pH, UA: 5.5 (ref 5.0–8.0)

## 2022-04-15 NOTE — Patient Instructions (Addendum)
Health Maintenance, Male Adopting a healthy lifestyle and getting preventive care are important in promoting health and wellness. Ask your health care provider about: The right schedule for you to have regular tests and exams. Things you can do on your own to prevent diseases and keep yourself healthy. What should I know about diet, weight, and exercise? Eat a healthy diet  Eat a diet that includes plenty of vegetables, fruits, low-fat dairy products, and lean protein. Do not eat a lot of foods that are high in solid fats, added sugars, or sodium. Maintain a healthy weight Body mass index (BMI) is a measurement that can be used to identify possible weight problems. It estimates body fat based on height and weight. Your health care provider can help determine your BMI and help you achieve or maintain a healthy weight. Get regular exercise Get regular exercise. This is one of the most important things you can do for your health. Most adults should: Exercise for at least 150 minutes each week. The exercise should increase your heart rate and make you sweat (moderate-intensity exercise). Do strengthening exercises at least twice a week. This is in addition to the moderate-intensity exercise. Spend less time sitting. Even light physical activity can be beneficial. Watch cholesterol and blood lipids Have your blood tested for lipids and cholesterol at 79 years of age, then have this test every 5 years. You may need to have your cholesterol levels checked more often if: Your lipid or cholesterol levels are high. You are older than 79 years of age. You are at high risk for heart disease. What should I know about cancer screening? Many types of cancers can be detected early and may often be prevented. Depending on your health history and family history, you may need to have cancer screening at various ages. This may include screening for: Colorectal cancer. Prostate cancer. Skin cancer. Lung  cancer. What should I know about heart disease, diabetes, and high blood pressure? Blood pressure and heart disease High blood pressure causes heart disease and increases the risk of stroke. This is more likely to develop in people who have high blood pressure readings or are overweight. Talk with your health care provider about your target blood pressure readings. Have your blood pressure checked: Every 3-5 years if you are 18-39 years of age. Every year if you are 40 years old or older. If you are between the ages of 65 and 75 and are a current or former smoker, ask your health care provider if you should have a one-time screening for abdominal aortic aneurysm (AAA). Diabetes Have regular diabetes screenings. This checks your fasting blood sugar level. Have the screening done: Once every three years after age 45 if you are at a normal weight and have a low risk for diabetes. More often and at a younger age if you are overweight or have a high risk for diabetes. What should I know about preventing infection? Hepatitis B If you have a higher risk for hepatitis B, you should be screened for this virus. Talk with your health care provider to find out if you are at risk for hepatitis B infection. Hepatitis C Blood testing is recommended for: Everyone born from 1945 through 1965. Anyone with known risk factors for hepatitis C. Sexually transmitted infections (STIs) You should be screened each year for STIs, including gonorrhea and chlamydia, if: You are sexually active and are younger than 79 years of age. You are older than 79 years of age and your   health care provider tells you that you are at risk for this type of infection. Your sexual activity has changed since you were last screened, and you are at increased risk for chlamydia or gonorrhea. Ask your health care provider if you are at risk. Ask your health care provider about whether you are at high risk for HIV. Your health care provider  may recommend a prescription medicine to help prevent HIV infection. If you choose to take medicine to prevent HIV, you should first get tested for HIV. You should then be tested every 3 months for as long as you are taking the medicine. Follow these instructions at home: Alcohol use Do not drink alcohol if your health care provider tells you not to drink. If you drink alcohol: Limit how much you have to 0-2 drinks a day. Know how much alcohol is in your drink. In the [place] one drink equals one 12 oz bottle of beer (355 mL), one 5 oz glass of wine (148 mL), or one 1 oz glass of hard liquor (44 mL). Lifestyle Do not use any products that contain nicotine or tobacco. These products include cigarettes, chewing tobacco, and vaping devices, such as e-cigarettes. If you need help quitting, ask your health care provider. Do not use street drugs. Do not share needles. Ask your health care provider for help if you need support or information about quitting drugs. General instructions Schedule regular health, dental, and eye exams. Stay current with your vaccines. Tell your health care provider if: You often feel depressed. You have ever been abused or do not feel safe at home. Summary Adopting a healthy lifestyle and getting preventive care are important in promoting health and wellness. Follow your health care provider's instructions about healthy diet, exercising, and getting tested or screened for diseases. Follow your health care provider's instructions on monitoring your cholesterol and blood pressure. This information is not intended to replace advice given to you by your health care provider. Make sure you discuss any questions you have with your health care provider. Document Revised: 12/22/2020 Document Reviewed: 12/22/2020 Elsevier Patient Education  [place].   Goal to cut down on carbs and sweets. Good luck and you can do this!

## 2022-04-15 NOTE — Progress Notes (Signed)
I,Tianna Badgett,acting as a Education administrator for [employer], FNP.,have documented all relevant documentation on the behalf of [name], FNP,as directed by  [name], FNP while in the presence of [name], [place].  Subjective:     Patient ID: Kevin Weiss , male    DOB: July 18, 1941 , 81 y.o.   MRN: [id number]   Chief Complaint  Patient presents with   Annual Exam    HPI  Here for HM. Would like to try to cut down on carbs and sweets before trying anything additional help.      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   Qualifier: Diagnosis of  By: Eusebio Friendly     Morbid obesity ([place]) 11/03/2015   OSA on CPAP 11/03/2015   Retrognathia 11/03/2015     Family History  Problem Relation Age of Onset   Hypertension Mother    Hyperlipidemia Mother    Alzheimer's disease Mother    Cancer Mother        type unbknown   Heart disease Father    Hyperlipidemia Father    Hypertension Father    Hypertension Sister    Hypertension Brother    Heart attack Maternal Grandmother      Current Outpatient Medications:    hydrochlorothiazide (MICROZIDE) 12.5 MG capsule, TAKE 1 CAPSULE BY MOUTH EVERY DAY, Disp: 90 capsule, Rfl: 0   lisinopril (ZESTRIL) 10 MG tablet, TAKE 1 TABLET BY MOUTH EVERY DAY, Disp: 90 tablet, Rfl: 0   aspirin 81 MG tablet, Take 81 mg by mouth daily., Disp: , Rfl:    Cholecalciferol (VITAMIN D-3) 1000 UNITS CAPS, Take 1 capsule by mouth daily., Disp: , Rfl:     ferrous sulfate 325 (65 FE) MG tablet, TAKE 1 TABLET (325 MG TOTAL) BY MOUTH 2 (TWICE) TIMES DAILY WITH MEALS., Disp: 180 tablet, Rfl: 1   hydrocortisone 2.5 % ointment, Apply topically 2 (two) times daily., Disp: 30 g, Rfl: 0   linaclotide (LINZESS) 145 MCG CAPS capsule, Take 1 capsule (145 mcg total) by mouth daily before breakfast., Disp: 90 capsule, Rfl: 2   Multiple Vitamins-Minerals (CENTRUM SILVER ADULT 50+) TABS, Take 1 tablet by mouth daily., Disp: , Rfl:    rosuvastatin (CRESTOR) 10 MG tablet, TAKE 1 TABLET BY MOUTH ON Monday, Wednesday AND FRIDAYS, Disp: 90 tablet, Rfl: 1   sildenafil (VIAGRA) 100 MG tablet, Take 100 mg by mouth daily as needed., Disp: , Rfl:    Vibegron (GEMTESA) 75 MG TABS, Take by mouth., Disp: , Rfl:    No Known Allergies   Men's preventive visit. Patient Health Questionnaire (PHQ-2) is  [place] Office Visit from 04/15/2022 in [facility]  PHQ-2 Total Score 0     Patient is on a Regular diet; does eat an increased amount of carbs. Not able to be consistent with exercising. He has had the lipo B injections and the first one did well but  the 2nd one did not have an energy boost. Marital status: Married. Relevant history for alcohol use is:  Social History   Substance and Sexual Activity  Alcohol Use Yes   Comment: social once every 3-4 months  . Relevant history for tobacco use is:  Social History   Tobacco Use  Smoking Status Former   Packs/day: 0.25   Years: 10.00   Total pack years: 2.50   Types: Cigarettes   Quit date: 1982   Years since quitting: 41.7  Smokeless Tobacco Never  Tobacco Comments   40 + years  .   Review of Systems  Constitutional: Negative.   HENT: Negative.    Eyes: Negative.   Respiratory: Negative.    Cardiovascular: Negative.   Gastrointestinal: Negative.   Endocrine: Negative.   Genitourinary: Negative.   Skin:        Left foot swelling   Allergic/Immunologic: Negative.   Neurological:  Negative.   Hematological: Negative.   Psychiatric/Behavioral: Negative.       Today's Vitals   04/15/22 1107  BP: 138/80  Pulse: 87  Temp: 98.2 F (36.8 C)  TempSrc: Oral  Weight: 253 lb (114.8 kg)  Height: 5' 7.5" (1.715 m)   Body mass index is 39.04 kg/m.  Wt Readings from Last 3 Encounters:  04/15/22 253 lb (114.8 kg)  12/09/21 251 lb (113.9 kg)  09/03/21 245 lb (111.1 kg)    Objective:  Physical Exam Vitals reviewed.  Constitutional:      General: He is not in acute distress.    Appearance: Normal appearance. He is obese.  HENT:     Head: Normocephalic and atraumatic.     Right Ear: Tympanic membrane, ear canal and external ear normal. There is no impacted cerumen.     Left Ear: Tympanic membrane, ear canal and external ear normal. There is no impacted cerumen.     Nose:     Comments: Deferred - masked    Mouth/Throat:     Comments: Deferred - masked Eyes:     General:        Right eye: No discharge.     Extraocular Movements: Extraocular movements intact.     Pupils: Pupils are equal, round, and reactive to light.  Cardiovascular:     Rate and Rhythm: Normal rate and regular rhythm.     Pulses: Normal pulses.     Heart sounds: Normal heart sounds. No murmur heard. Pulmonary:     Effort: Pulmonary effort is normal. No respiratory distress.     Breath sounds: Normal breath sounds.  Abdominal:     General: Abdomen is flat. Bowel sounds are normal. There is no distension.     Palpations: Abdomen is soft. There is no mass.     Tenderness: There is no abdominal tenderness.  Genitourinary:    Comments: N/A - past age of checking prostate Musculoskeletal:        General: Normal range of motion.     Cervical back: Normal range of motion and neck supple.     Left lower leg: Edema (trace today) present.  Skin:    General: Skin is warm.     Capillary Refill: Capillary refill takes less than 2 seconds.  Neurological:     General: No focal deficit present.      Mental Status: He is alert and oriented to person, place, and time.     Cranial Nerves: No cranial nerve deficit.     Motor: No weakness.  Psychiatric:  Mood and Affect: Mood normal.        Behavior: Behavior normal.        Thought Content: Thought content normal.        Judgment: Judgment normal.         Assessment And Plan:    1. Health maintenance examination Behavior modifications discussed and diet history reviewed.   Pt will continue to exercise regularly and modify diet with low GI, plant based foods and decrease intake of processed foods.  Recommend intake of daily multivitamin, Vitamin D, and calcium.  Recommend colonoscopy for preventive screenings, as well as recommend immunizations that include influenza, TDAP, and Shingles  2. Hypertensive heart disease with chronic diastolic congestive heart failure ([place]) Comments: Blood pressure is controlled, continue current medications. EKG done with Right bundle branch block with left axis -bifascicular block.HR 77 - POCT Urinalysis Dipstick (81002) - Microalbumin / Creatinine Urine Ratio - EKG 12-Lead  3. Prediabetes Comments: Stable, encouraged to continue to limit intake of sugary foods and drinks and cut back on carbs. Increase physical activity as tolerated.   4. Mixed hyperlipidemia Comments: Stable, continue medications as directed, tolerating well  5. Class 2 severe obesity due to excess calories with serious comorbidity and body mass index (BMI) of 39.0 to 39.9 in adult [facility]) He is encouraged to initially strive for BMI less than 30 to decrease cardiac risk. He is advised to exercise no less than 150 minutes per week. Long discussion about types of foods to eat to help with weight loss and the importance of exercising regularly 6. Swelling of left foot Comments: Has had a workup which was normal. Encouraged to elevate and wear a support sock to help  - Uric acid    Patient was given opportunity to ask  questions. Patient verbalized understanding of the plan and was able to repeat key elements of the plan. All questions were answered to their satisfaction.   [name], FNP   I, [name], FNP, have reviewed all documentation for this visit. The documentation on 04/15/22 for the exam, diagnosis, procedures, and orders are all accurate and complete.   THE PATIENT IS ENCOURAGED TO PRACTICE SOCIAL DISTANCING DUE TO THE COVID-19 PANDEMIC.

## 2022-04-20 ENCOUNTER — Ambulatory Visit: Payer: Self-pay

## 2022-04-20 NOTE — Patient Outreach (Signed)
  Care Coordination   Follow Up Visit Note   04/20/2022 Name: Kevin Weiss MRN: [id number] DOB: 10/13/1940  Kevin Weiss is a 81 y.o. year old male who sees [name], [place] for primary care. I spoke with  [name] by phone today.  What matters to the patients health and wellness today?  I want to work on exercising more.     Goals Addressed     Patient Stated     I want to exercise more (pt-stated)        Care Coordination Interventions: Review of patient status, including review of consultants reports, relevant laboratory and other test results, and medications completed Educated on exercise recommendations (150 minutes weekly) Provided patient with education regarding the provider exercise referral program (PREP), educated on how to get started  Educated patient about using the plate method and portion control  Encouraged patient to notify his PCP of new symptoms or concerns     SDOH assessments and interventions completed:  No     Care Coordination Interventions Activated:  Yes  Care Coordination Interventions:  Yes, provided   Follow up plan: Follow up call scheduled for 07/22/22 09:00 AM     Encounter Outcome:  Pt. Visit Completed

## 2022-04-20 NOTE — Patient Instructions (Signed)
Visit Information  Thank you for taking time to visit with me today. Please don't hesitate to contact me if I can be of assistance to you.   Following are the goals we discussed today:   Goals Addressed     Patient Stated     I want to exercise more (pt-stated)        Care Coordination Interventions: Review of patient status, including review of consultants reports, relevant laboratory and other test results, and medications completed Educated on exercise recommendations (150 minutes weekly) Provided patient with education regarding the provider exercise referral program (PREP), educated on how to get started  Educated patient about using the plate method and portion control  Encouraged patient to notify his PCP of new symptoms or concerns     Our next appointment is by telephone on 07/22/22 at 0900 AM  Please call the care guide team at [phone] if you need to cancel or reschedule your appointment.   If you are experiencing a Mental Health or [place] or need someone to talk to, please call 1-800-273-TALK (toll free, 24 hour hotline)  Patient verbalizes understanding of instructions and care plan provided today and agrees to view in [place]. Active MyChart status and patient understanding of how to access instructions and care plan via MyChart confirmed with patient.     Barb Merino, RN, BSN, CCM Care Management Coordinator [facility] Care Management Direct Phone: [phone]

## 2022-05-05 ENCOUNTER — Other Ambulatory Visit: Payer: Self-pay | Admitting: Nurse Practitioner

## 2022-05-05 MED ORDER — HYDROCORTISONE 2.5 % EX OINT: TOPICAL_OINTMENT | Freq: Two times a day (BID) | CUTANEOUS | 0 refills | Status: DC

## 2022-05-29 ENCOUNTER — Other Ambulatory Visit: Payer: Self-pay | Admitting: Nurse Practitioner

## 2022-05-29 DIAGNOSIS — I10 Essential (primary) hypertension: Secondary | ICD-10-CM

## 2022-06-01 ENCOUNTER — Other Ambulatory Visit: Payer: Self-pay | Admitting: Nurse Practitioner

## 2022-06-01 ENCOUNTER — Other Ambulatory Visit: Payer: Self-pay

## 2022-06-01 DIAGNOSIS — D508 Other iron deficiency anemias: Secondary | ICD-10-CM

## 2022-06-01 DIAGNOSIS — I10 Essential (primary) hypertension: Secondary | ICD-10-CM

## 2022-06-01 MED ORDER — HYDROCHLOROTHIAZIDE 12.5 MG PO CAPS: ORAL_CAPSULE | ORAL | 1 refills | Status: DC

## 2022-07-11 ENCOUNTER — Other Ambulatory Visit: Payer: Self-pay | Admitting: Nurse Practitioner

## 2022-07-12 MED ORDER — LISINOPRIL 10 MG PO TABS: 10.0000 mg | ORAL_TABLET | Freq: Every day | ORAL | 0 refills | Status: DC

## 2022-07-20 ENCOUNTER — Encounter: Payer: Self-pay | Admitting: Nurse Practitioner

## 2022-07-20 ENCOUNTER — Ambulatory Visit (INDEPENDENT_AMBULATORY_CARE_PROVIDER_SITE_OTHER): Payer: Medicare HMO | Admitting: Nurse Practitioner

## 2022-07-20 VITALS — BP 140/92 | HR 80 | Temp 98.1°F | Ht 67.0 in | Wt 251.2 lb

## 2022-07-20 DIAGNOSIS — E782 Mixed hyperlipidemia: Secondary | ICD-10-CM | POA: Diagnosis not present

## 2022-07-20 DIAGNOSIS — I5032 Chronic diastolic (congestive) heart failure: Secondary | ICD-10-CM

## 2022-07-20 DIAGNOSIS — R7303 Prediabetes: Secondary | ICD-10-CM | POA: Diagnosis not present

## 2022-07-20 DIAGNOSIS — Z23 Encounter for immunization: Secondary | ICD-10-CM | POA: Diagnosis not present

## 2022-07-20 DIAGNOSIS — I110 Hypertensive heart disease with heart failure: Secondary | ICD-10-CM | POA: Diagnosis not present

## 2022-07-20 DIAGNOSIS — Z79899 Other long term (current) drug therapy: Secondary | ICD-10-CM

## 2022-07-20 NOTE — Progress Notes (Signed)
I,Kevin Weiss,acting as a Education administrator for Kevin Brine, FNP.,have documented all relevant documentation on the behalf of Kevin Brine, FNP,as directed by  Kevin Brine, FNP while in the presence of Kevin Weiss, [place].    Subjective:     Patient ID: Kevin Weiss , male    DOB: 04-Oct-1940 , 81 y.o.   MRN: [id number]   Chief Complaint  Patient presents with   Hypertension    HPI  Pt here today for hypertension f/u pt is compliant with all meds.  He denies chest pain, SOB, blurred vision, dizziness. He has seen urology and no changes.   He adds, getting easily winded. Depending on how fast he walks.  He has failed to do well with the exercises. He does not want to go to PREP yet. He ate venison yesterday and his wife does season with salt. He does not always drink adequate water.   Hypertension This is a chronic problem. The current episode started more than 1 year ago. The problem is uncontrolled. Pertinent negatives include no headaches. There are no associated agents to hypertension. Risk factors for coronary artery disease include obesity and sedentary lifestyle. There are no compliance problems.  There is no history of chronic renal disease.     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   Qualifier: Diagnosis of  By: Eusebio Friendly     Morbid obesity ([place]) 11/03/2015   OSA on CPAP 11/03/2015   Retrognathia 11/03/2015     Family History  Problem Relation Age of Onset    Hypertension Mother    Hyperlipidemia Mother    Alzheimer's disease Mother    Cancer Mother        type unbknown   Heart disease Father    Hyperlipidemia Father    Hypertension Father    Hypertension Sister    Hypertension Brother    Heart attack Maternal Grandmother      Current Outpatient Medications:    aspirin 81 MG tablet, Take 81 mg by mouth daily., Disp: , Rfl:    Cholecalciferol (VITAMIN D-3) 1000 UNITS CAPS, Take 1 capsule by mouth daily., Disp: , Rfl:    ferrous sulfate 325 (65 FE) MG tablet, TAKE 1 TABLET (325 MG TOTAL) BY MOUTH 2 (TWICE) TIMES DAILY WITH MEALS., Disp: 180 tablet, Rfl: 1   hydrochlorothiazide (MICROZIDE) 12.5 MG capsule, TAKE 1 CAPSULE BY MOUTH EVERY DAY, Disp: 90 capsule, Rfl: 1   hydrocortisone 2.5 % ointment, Apply topically 2 (two) times daily., Disp: 30 g, Rfl: 0   linaclotide (LINZESS) 145 MCG CAPS capsule, Take 1 capsule (145 mcg total) by mouth daily before breakfast., Disp: 90 capsule, Rfl: 2   lisinopril (ZESTRIL) 10 MG tablet, Take 1 tablet (10 mg total) by mouth daily., Disp: 90 tablet, Rfl: 0   Multiple Vitamins-Minerals (CENTRUM SILVER ADULT 50+) TABS, Take 1 tablet by mouth daily., Disp: , Rfl:    rosuvastatin (CRESTOR) 10  MG tablet, TAKE 1 TABLET BY MOUTH ON Monday, Wednesday AND FRIDAYS, Disp: 90 tablet, Rfl: 1   sildenafil (VIAGRA) 100 MG tablet, Take 100 mg by mouth daily as needed., Disp: , Rfl:    Vibegron (GEMTESA) 75 MG TABS, Take by mouth., Disp: , Rfl:    No Known Allergies   Review of Systems  Constitutional: Negative.   HENT: Negative.    Gastrointestinal: Negative.   Genitourinary: Negative.   Skin: Negative.   Allergic/Immunologic: Negative.   Neurological: Negative.  Negative for headaches.  Psychiatric/Behavioral: Negative.       Today's Vitals   07/20/22 1443 07/20/22 1536  BP: (!) 138/98 (!) 140/92  Pulse: 80   Temp: 98.1 F (36.7 C)   Weight: 251 lb 3.2 oz (113.9 kg)   Height: _0  (1.702 m)    Body mass  index is 39.34 kg/m.  Wt Readings from Last 3 Encounters:  07/20/22 251 lb 3.2 oz (113.9 kg)  04/15/22 253 lb (114.8 kg)  12/09/21 251 lb (113.9 kg)    Objective:  Physical Exam Vitals reviewed.  Constitutional:      General: He is not in acute distress.    Appearance: Normal appearance. He is obese.  Cardiovascular:     Rate and Rhythm: Normal rate and regular rhythm.     Pulses: Normal pulses.     Heart sounds: Normal heart sounds. No murmur heard. Pulmonary:     Effort: Pulmonary effort is normal. No respiratory distress.     Breath sounds: Normal breath sounds. No wheezing.  Skin:    General: Skin is warm and dry.     Capillary Refill: Capillary refill takes less than 2 seconds.  Neurological:     General: No focal deficit present.     Mental Status: He is alert and oriented to person, place, and time.  Psychiatric:        Mood and Affect: Mood normal.        Behavior: Behavior normal.        Thought Content: Thought content normal.        Judgment: Judgment normal.         Assessment And Plan:     1. Hypertensive heart disease with chronic diastolic congestive heart failure ([place]) Comments: Blood pressure is slightly elevated he is advised to avoid foods high in salt and to increase his physical activity, he is not interested in PREP - CMP14+EGFR  2. Prediabetes Comments: Stable, continue focusing on healthy diet - Hemoglobin A1c  3. Mixed hyperlipidemia Comments: Continue statin, tolerating well. - Lipid panel  4. Need for influenza vaccination Influenza vaccine administered Encouraged to take Tylenol as needed for fever or muscle aches. - Flu Vaccine QUAD High Dose(Fluad)  5. Other long term (current) drug therapy - CBC     Patient was given opportunity to ask questions. Patient verbalized understanding of the plan and was able to repeat key elements of the plan. All questions were answered to their satisfaction.  Kevin Brine, FNP   I, Kevin Brine, FNP, have reviewed all documentation for this visit. The documentation on 07/20/22 for the exam, diagnosis, procedures, and orders are all accurate and complete.   IF YOU HAVE BEEN REFERRED TO A SPECIALIST, IT MAY TAKE 1-2 WEEKS TO SCHEDULE/PROCESS THE REFERRAL. IF YOU HAVE NOT HEARD FROM US/SPECIALIST IN TWO WEEKS, PLEASE GIVE [place] A CALL AT [phone].   THE PATIENT IS ENCOURAGED TO PRACTICE SOCIAL DISTANCING DUE TO THE COVID-19  PANDEMIC.

## 2022-07-20 NOTE — Patient Instructions (Signed)
Hypertension, Adult ?Hypertension is another name for high blood pressure. High blood pressure forces your heart to work harder to pump blood. This can cause problems over time. ?There are two numbers in a blood pressure reading. There is a top number (systolic) over a bottom number (diastolic). It is best to have a blood pressure that is below 120/80. ?What are the causes? ?The cause of this condition is not known. Some other conditions can lead to high blood pressure. ?What increases the risk? ?Some lifestyle factors can make you more likely to develop high blood pressure: ?Smoking. ?Not getting enough exercise or physical activity. ?Being overweight. ?Having too much fat, sugar, calories, or salt (sodium) in your diet. ?Drinking too much alcohol. ?Other risk factors include: ?Having any of these conditions: ?Heart disease. ?Diabetes. ?High cholesterol. ?Kidney disease. ?Obstructive sleep apnea. ?Having a family history of high blood pressure and high cholesterol. ?Age. The risk increases with age. ?Stress. ?What are the signs or symptoms? ?High blood pressure may not cause symptoms. Very high blood pressure (hypertensive crisis) may cause: ?Headache. ?Fast or uneven heartbeats (palpitations). ?Shortness of breath. ?Nosebleed. ?Vomiting or feeling like you may vomit (nauseous). ?Changes in how you see. ?Very bad chest pain. ?Feeling dizzy. ?Seizures. ?How is this treated? ?This condition is treated by making healthy lifestyle changes, such as: ?Eating healthy foods. ?Exercising more. ?Drinking less alcohol. ?Your doctor may prescribe medicine if lifestyle changes do not help enough and if: ?Your top number is above 130. ?Your bottom number is above 80. ?Your personal target blood pressure may vary. ?Follow these instructions at home: ?Eating and drinking ? ?If told, follow the DASH eating plan. To follow this plan: ?Fill one half of your plate at each meal with fruits and vegetables. ?Fill one fourth of your plate  at each meal with whole grains. Whole grains include whole-wheat pasta, brown rice, and whole-grain bread. ?Eat or drink low-fat dairy products, such as skim milk or low-fat yogurt. ?Fill one fourth of your plate at each meal with low-fat (lean) proteins. Low-fat proteins include fish, chicken without skin, eggs, beans, and tofu. ?Avoid fatty meat, cured and processed meat, or chicken with skin. ?Avoid pre-made or processed food. ?Limit the amount of salt in your diet to less than 1,500 mg each day. ?Do not drink alcohol if: ?Your doctor tells you not to drink. ?You are pregnant, may be pregnant, or are planning to become pregnant. ?If you drink alcohol: ?Limit how much you have to: ?0-1 drink a day for women. ?0-2 drinks a day for men. ?Know how much alcohol is in your drink. In the [place] one drink equals one 12 oz bottle of beer (355 mL), one 5 oz glass of wine (148 mL), or one 1? oz glass of hard liquor (44 mL). ?Lifestyle ? ?Work with your doctor to stay at a healthy weight or to lose weight. Ask your doctor what the best weight is for you. ?Get at least 30 minutes of exercise that causes your heart to beat faster (aerobic exercise) most days of the week. This may include walking, swimming, or biking. ?Get at least 30 minutes of exercise that strengthens your muscles (resistance exercise) at least 3 days a week. This may include lifting weights or doing Pilates. ?Do not smoke or use any products that contain nicotine or tobacco. If you need help quitting, ask your doctor. ?Check your blood pressure at home as told by your doctor. ?Keep all follow-up visits. ?Medicines ?Take over-the-counter and prescription medicines   only as told by your doctor. Follow directions carefully. ?Do not skip doses of blood pressure medicine. The medicine does not work as well if you skip doses. Skipping doses also puts you at risk for problems. ?Ask your doctor about side effects or reactions to medicines that you should watch  for. ?Contact a doctor if: ?You think you are having a reaction to the medicine you are taking. ?You have headaches that keep coming back. ?You feel dizzy. ?You have swelling in your ankles. ?You have trouble with your vision. ?Get help right away if: ?You get a very bad headache. ?You start to feel mixed up (confused). ?You feel weak or numb. ?You feel faint. ?You have very bad pain in your: ?Chest. ?Belly (abdomen). ?You vomit more than once. ?You have trouble breathing. ?These symptoms may be an emergency. Get help right away. Call 911. ?Do not wait to see if the symptoms will go away. ?Do not drive yourself to the hospital. ?Summary ?Hypertension is another name for high blood pressure. ?High blood pressure forces your heart to work harder to pump blood. ?For most people, a normal blood pressure is less than 120/80. ?Making healthy choices can help lower blood pressure. If your blood pressure does not get lower with healthy choices, you may need to take medicine. ?This information is not intended to replace advice given to you by your health care provider. Make sure you discuss any questions you have with your health care provider. ?Document Revised: 05/21/2021 Document Reviewed: 05/21/2021 ?Elsevier Patient Education ? 2023 Elsevier Inc. ? ?

## 2022-07-21 LAB — CBC
Hematocrit: 46.1 % (ref 37.5–51.0)
Hemoglobin: 15.1 g/dL (ref 13.0–17.7)
MCH: 28.8 pg (ref 26.6–33.0)
MCHC: 32.8 g/dL (ref 31.5–35.7)
MCV: 88 fL (ref 79–97)
Platelets: 307 10*3/uL (ref 150–450)
RBC: 5.24 x10E6/uL (ref 4.14–5.80)
RDW: 13.1 % (ref 11.6–15.4)
WBC: 6.3 10*3/uL (ref 3.4–10.8)

## 2022-07-21 LAB — LIPID PANEL
Chol/HDL Ratio: 3.7 ratio (ref 0.0–5.0)
Cholesterol, Total: 127 mg/dL (ref 100–199)
HDL: 34 mg/dL — ABNORMAL LOW (ref >39)
LDL Chol Calc (NIH): 73 mg/dL (ref 0–99)
Triglycerides: 107 mg/dL (ref 0–149)
VLDL Cholesterol Cal: 20 mg/dL (ref 5–40)

## 2022-07-21 LAB — CMP14+EGFR
ALT: 31 IU/L (ref 0–44)
AST: 29 IU/L (ref 0–40)
Albumin/Globulin Ratio: 1.6 (ref 1.2–2.2)
Albumin: 4.6 g/dL (ref 3.8–4.8)
Alkaline Phosphatase: 153 IU/L — ABNORMAL HIGH (ref 44–121)
BUN/Creatinine Ratio: 14 (ref 10–24)
BUN: 16 mg/dL (ref 8–27)
Bilirubin Total: 0.3 mg/dL (ref 0.0–1.2)
CO2: 23 mmol/L (ref 20–29)
Calcium: 9.8 mg/dL (ref 8.6–10.2)
Chloride: 103 mmol/L (ref 96–106)
Creatinine, Ser: 1.11 mg/dL (ref 0.76–1.27)
Globulin, Total: 2.8 g/dL (ref 1.5–4.5)
Glucose: 91 mg/dL (ref 70–99)
Potassium: 4.2 mmol/L (ref 3.5–5.2)
Sodium: 143 mmol/L (ref 134–144)
Total Protein: 7.4 g/dL (ref 6.0–8.5)
eGFR: 68 mL/min/{1.73_m2} (ref >59)

## 2022-07-21 LAB — HEMOGLOBIN A1C
Est. average glucose Bld gHb Est-mCnc: 123 mg/dL
Hgb A1c MFr Bld: 5.9 % — ABNORMAL HIGH (ref 4.8–5.6)

## 2022-07-22 ENCOUNTER — Ambulatory Visit: Payer: Self-pay

## 2022-07-22 NOTE — Patient Outreach (Signed)
  Care Coordination   07/22/2022 Name: Kevin Weiss MRN: [id number] DOB: 04-20-1941   Care Coordination Outreach Attempts:  An unsuccessful telephone outreach was attempted today to offer the patient information about available care coordination services as a benefit of their health plan.   Follow Up Plan:  Additional outreach attempts will be made to offer the patient care coordination information and services.   Encounter Outcome:  No Answer   Care Coordination Interventions:  No, not indicated    Barb Merino, RN, BSN, CCM Care Management Coordinator [facility] Care Management Direct Phone: [phone]

## 2022-08-16 ENCOUNTER — Other Ambulatory Visit: Payer: Self-pay | Admitting: Nurse Practitioner

## 2022-08-20 ENCOUNTER — Other Ambulatory Visit: Payer: Self-pay | Admitting: Nurse Practitioner

## 2022-08-20 ENCOUNTER — Telehealth: Payer: Self-pay | Admitting: *Deleted

## 2022-08-20 NOTE — Progress Notes (Signed)
  Care Coordination Note  08/20/2022 Name: Kevin Weiss MRN: [id number] DOB: 27-Mar-1941  Kevin Weiss is a 82 y.o. year old male who is a primary care patient of [name], [place] and is actively engaged with the care management team. I reached out to [name] by phone today to assist with re-scheduling a follow up visit with the RN Case Manager  Follow up plan: A second Unsuccessful telephone outreach attempt made. A HIPAA compliant phone message was left for the patient providing contact information and requesting a return call.  [place]  Direct Dial: [phone]

## 2022-08-20 NOTE — Progress Notes (Signed)
  Care Coordination Note  08/20/2022 Name: Kevin Weiss MRN: [id number] DOB: February 26, 1941  Kevin Weiss is a 82 y.o. year old male who is a primary care patient of [name], [place] and is actively engaged with the care management team. I reached out to [name] by phone today to assist with re-scheduling a follow up visit with the RN Case Manager  Follow up plan: Telephone appointment with care management team member scheduled for:09/07/22  [place]: [phone]

## 2022-08-30 ENCOUNTER — Ambulatory Visit: Payer: Medicare HMO | Admitting: Adult Health

## 2022-08-31 ENCOUNTER — Encounter: Payer: Self-pay | Admitting: *Deleted

## 2022-09-01 ENCOUNTER — Encounter: Payer: Self-pay | Admitting: Adult Health

## 2022-09-01 ENCOUNTER — Ambulatory Visit (INDEPENDENT_AMBULATORY_CARE_PROVIDER_SITE_OTHER): Payer: Medicare HMO | Admitting: Adult Health

## 2022-09-01 VITALS — BP 146/84 | HR 84 | Ht 67.0 in | Wt 250.0 lb

## 2022-09-01 DIAGNOSIS — G4733 Obstructive sleep apnea (adult) (pediatric): Secondary | ICD-10-CM | POA: Diagnosis not present

## 2022-09-01 NOTE — Patient Instructions (Signed)
Continue using CPAP nightly and greater than 4 hours each night °If your symptoms worsen or you develop new symptoms please let us know.  ° °

## 2022-09-01 NOTE — Progress Notes (Signed)
PATIENT: Kevin Weiss DOB: 11/15/1940  REASON FOR VISIT: follow up HISTORY FROM: patient PRIMARY NEUROLOGIST: Dr. Brett Fairy  Chief Complaint  Patient presents with   Follow-up    Pt in 5  Pt here for CPAP f/u Pt has no questions or concerns for todays visit      HISTORY OF PRESENT ILLNESS: Today 09/01/22  TOWNES FUHS is a 82 y.o. male with a history of OSA on CPAP. Returns today for follow-up. No issues with the machine. Continues to see the benefit. Feels well rested. Doesn't require naps. Goes to bed around 1AM and wakes up at 9AM.         REVIEW OF SYSTEMS: Out of a complete 14 system review of symptoms, the patient complains only of the following symptoms, and all other reviewed systems are negative.  ESS3    ALLERGIES: No Known Allergies  HOME MEDICATIONS: Outpatient Medications Prior to Visit  Medication Sig Dispense Refill   aspirin 81 MG tablet Take 81 mg by mouth daily.     Cholecalciferol (VITAMIN D-3) 1000 UNITS CAPS Take 1 capsule by mouth daily.     ferrous sulfate 325 (65 FE) MG tablet TAKE 1 TABLET (325 MG TOTAL) BY MOUTH 2 (TWICE) TIMES DAILY WITH MEALS. 180 tablet 1   hydrochlorothiazide (MICROZIDE) 12.5 MG capsule TAKE 1 CAPSULE BY MOUTH EVERY DAY 90 capsule 1   hydrocortisone 2.5 % ointment Apply topically 2 (two) times daily. 30 g 0   LINZESS 145 MCG CAPS capsule TAKE 1 CAPSULE BY MOUTH DAILY BEFORE BREAKFAST. 90 capsule 2   lisinopril (ZESTRIL) 10 MG tablet Take 1 tablet (10 mg total) by mouth daily. 90 tablet 0   Multiple Vitamins-Minerals (CENTRUM SILVER ADULT 50+) TABS Take 1 tablet by mouth daily.     rosuvastatin (CRESTOR) 10 MG tablet TAKE 1 TABLET BY MOUTH ON Monday, Wednesday AND FRIDAYS 90 tablet 1   sildenafil (VIAGRA) 100 MG tablet Take 100 mg by mouth daily as needed.     Vibegron (GEMTESA) 75 MG TABS Take by mouth.     No facility-administered medications prior to visit.    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   Qualifier: Diagnosis of  By: Eusebio Friendly     Morbid obesity ([place]) 11/03/2015   OSA on CPAP 11/03/2015   Retrognathia 11/03/2015    PAST SURGICAL HISTORY: Past Surgical History:  Procedure Laterality Date   COLONOSCOPY     KNEE ARTHROSCOPY Left 15+yrs ago   TOTAL HIP ARTHROPLASTY Right 03/12/2014   Procedure: TOTAL HIP ARTHROPLASTY ANTERIOR APPROACH;  Surgeon: Hessie Dibble, MD;  Location: [facility];  Service: Orthopedics;  Laterality: Right;   TOTAL HIP ARTHROPLASTY Left 04/11/2018   Procedure: TOTAL HIP ARTHROPLASTY ANTERIOR APPROACH;  Surgeon: Melrose Nakayama, MD;  Location: [facility];  Service: Orthopedics;  Laterality: Left;    FAMILY HISTORY: Family History  Problem Relation  Age of Onset   Hypertension Mother    Hyperlipidemia Mother    Alzheimer's disease Mother    Cancer Mother        type unbknown   Heart disease Father    Hyperlipidemia Father    Hypertension Father    Hypertension Sister    Hypertension Brother    Heart attack Maternal Grandmother    Sleep apnea Neg Hx     SOCIAL HISTORY: Social History   Socioeconomic History   Marital status: Married    Spouse name: Not on file   Number of children: 0   Years of education: Not on file   Highest education level: Not on file  Occupational History   Occupation: retired  Tobacco Use   Smoking status: Former    Packs/day: 0.25    Years: 10.00    Total pack years: 2.50    Types:  Cigarettes    Quit date: 1982    Years since quitting: 42.0   Smokeless tobacco: Never   Tobacco comments:    60 + years  Vaping Use   Vaping Use: Never used  Substance and Sexual Activity   Alcohol use: Yes    Comment: social once every 3-4 months   Drug use: Not Currently    Types: Marijuana    Comment: 03/30/18   Sexual activity: Yes  Other Topics Concern   Not on file  Social History Narrative   Live at home with wife. Retired from [employer] as a Librarian, academic.    Social Determinants of Health   Financial Resource Strain: Low Risk  (09/03/2021)   Overall Financial Resource Strain (CARDIA)    Difficulty of Paying Living Expenses: Not hard at all  Food Insecurity: No Food Insecurity (09/03/2021)   Hunger Vital Sign    Worried About Running Out of Food in the Last Year: Never true    Ran Out of Food in the Last Year: Never true  Transportation Needs: No Transportation Needs (09/03/2021)   PRAPARE - Hydrologist (Medical): No    Lack of Transportation (Non-Medical): No  Physical Activity: Insufficiently Active (09/03/2021)   Exercise Vital Sign    Days of Exercise per Week: 3 days    Minutes of Exercise per Session: 20 min  Stress: No Stress Concern Present (09/03/2021)   [place]    Feeling of Stress : Not at all  Social Connections: Not on file  Intimate Partner Violence: Not At Risk (03/08/2019)   Humiliation, Afraid, Rape, and Kick questionnaire    Fear of Current or Ex-Partner: No    Emotionally Abused: No    Physically Abused: No    Sexually Abused: No      PHYSICAL EXAM  Vitals:   09/01/22 1418  BP: (!) 146/84  Pulse: 84  Weight: 250 lb (113.4 kg)  Height: '5\' 7"'$  (1.702 m)   Body mass index is 39.16 kg/m.  Generalized: Well developed, in no acute distress  Chest: Lungs clear to auscultation bilaterally  Neurological examination   Mentation: Alert oriented to time, place, history taking. Follows all commands speech and language fluent Cranial nerve II-XII: Extraocular movements were full, visual field were full on confrontational test Head turning and shoulder shrug  were normal and symmetric.   Gait and station: Gait is normal.    DIAGNOSTIC DATA (LABS, IMAGING, TESTING) - I reviewed patient records, labs, notes, testing and imaging myself where  available.  Lab Results  Component Value Date   WBC 6.3 07/20/2022   HGB 15.1 07/20/2022   HCT 46.1 07/20/2022   MCV 88 07/20/2022   PLT 307 07/20/2022      Component Value Date/Time   NA 143 07/20/2022 1534   K 4.2 07/20/2022 1534   CL 103 07/20/2022 1534   CO2 23 07/20/2022 1534   GLUCOSE 91 07/20/2022 1534   GLUCOSE 138 (H) 04/12/2018 0358   BUN 16 07/20/2022 1534   CREATININE 1.11 07/20/2022 1534   CALCIUM 9.8 07/20/2022 1534   PROT 7.4 07/20/2022 1534   ALBUMIN 4.6 07/20/2022 1534   AST 29 07/20/2022 1534   ALT 31 07/20/2022 1534   ALKPHOS 153 (H) 07/20/2022 1534   BILITOT 0.3 07/20/2022 1534   GFRNONAA 51 (L) 09/10/2020 1658   GFRAA 59 (L) 09/10/2020 1658   Lab Results  Component Value Date   CHOL 127 07/20/2022   HDL 34 (L) 07/20/2022   LDLCALC 73 07/20/2022   TRIG 107 07/20/2022   CHOLHDL 3.7 07/20/2022   Lab Results  Component Value Date   HGBA1C 5.9 (H) 07/20/2022   Lab Results  Component Value Date   VITAMINB12 815 08/05/2021   Lab Results  Component Value Date   TSH 1.750 08/05/2021      ASSESSMENT AND PLAN 82 y.o. year old male  has a past medical history of Arthritis, Cardiomegaly (10/13/2006), CHF, EJECTION FRACTION > OR = 50% (10/13/2006), Degenerative joint disease (DJD) of hip (03/12/2014), GASTROESOPHAGEAL REFLUX, NO ESOPHAGITIS (10/13/2006), HEMORRHOIDS, NOS (10/13/2006), History of colon polyps, Hypertension, IMPOTENCE, ORGANIC (10/13/2006), Morbid obesity ([place]) (11/03/2015), OSA on CPAP (11/03/2015), and Retrognathia  (11/03/2015). here with:  OSA on CPAP  - CPAP compliance excellent - Good treatment of AHI  - Encourage patient to use CPAP nightly and > 4 hours each night - F/U in 1 year or sooner if needed    Ward Givens, MSN, NP-C 09/01/2022, 2:24 PM [facility] [address], [place], [place] [address] [phone]

## 2022-09-07 ENCOUNTER — Ambulatory Visit: Payer: Self-pay

## 2022-09-07 NOTE — Patient Instructions (Signed)
Visit Information  Thank you for taking time to visit with me today. Please don't hesitate to contact me if I can be of assistance to you.   Following are the goals we discussed today:   Goals Addressed               This Visit's Progress     Patient Stated     COMPLETED: I want to exercise more (pt-stated)        Care Coordination Interventions: Determined patient does not wish to participate in the PREP program at this time      COMPLETED: To monitor blood pressure at home and keep under good control (pt-stated)        Care Coordination Interventions: Evaluation of current treatment plan related to hypertension self management and patient's adherence to plan as established by provider Reviewed medications with patient and discussed importance of compliance Advised patient, providing education and rationale, to monitor blood pressure daily and record, calling PCP for findings outside established parameters Provided education on prescribed diet low Sodium Mailed printed educational materials related to how to accurately check BP at home; how to boost HDL              If you are experiencing a Mental Health or [place] or need someone to talk to, please go to [facility] [address] [place] [phone])  Patient verbalizes understanding of instructions and care plan provided today and agrees to view in [place]. Active MyChart status and patient understanding of how to access instructions and care plan via MyChart confirmed with patient.     Barb Merino, RN, BSN, CCM Care Management Coordinator [facility] Care Management  Direct Phone: [phone]

## 2022-09-07 NOTE — Patient Outreach (Signed)
  Care Coordination   Follow Up Visit Note   09/07/2022 Name: Kevin Weiss MRN: [id number] DOB: [birth year]  Kevin Weiss is a 82 y.o. year old male who sees [name], FNP for primary care. I spoke with  Kevin Weiss by phone today.  What matters to the patients health and wellness today?  Patient would like to continue to monitor his BP at home.     Goals Addressed               This Visit's Progress     Patient Stated     COMPLETED: I want to exercise more (pt-stated)        Care Coordination Interventions: Determined patient does not wish to participate in the PREP program at this time      COMPLETED: To monitor blood pressure at home and keep under good control (pt-stated)        Care Coordination Interventions: Evaluation of current treatment plan related to hypertension self management and patient's adherence to plan as established by provider Reviewed medications with patient and discussed importance of compliance Advised patient, providing education and rationale, to monitor blood pressure daily and record, calling PCP for findings outside established parameters Provided education on prescribed diet low Sodium Mailed printed educational materials related to how to accurately check BP at home; how to boost HDL             SDOH assessments and interventions completed:  No     Care Coordination Interventions:  Yes, provided   Follow up plan: No further intervention required.   Encounter Outcome:  Pt. Visit Completed

## 2022-09-08 ENCOUNTER — Telehealth: Payer: Self-pay | Admitting: *Deleted

## 2022-09-08 DIAGNOSIS — G4733 Obstructive sleep apnea (adult) (pediatric): Secondary | ICD-10-CM

## 2022-09-08 NOTE — Telephone Encounter (Signed)
Per Jinny Blossom NP, called pt to let him know that per [facility], his cpap machine was setup on 05/21/2018 so he won't be eligible for a new machine until 05/23/2023. Please let him know if he calls back. I will also send a mychart message.

## 2022-09-09 ENCOUNTER — Ambulatory Visit (INDEPENDENT_AMBULATORY_CARE_PROVIDER_SITE_OTHER): Payer: Medicare HMO

## 2022-09-09 VITALS — BP 118/70 | HR 97 | Temp 98.2°F | Ht 66.6 in | Wt 249.6 lb

## 2022-09-09 DIAGNOSIS — Z0000 Encounter for general adult medical examination without abnormal findings: Secondary | ICD-10-CM | POA: Diagnosis not present

## 2022-09-09 NOTE — Patient Instructions (Signed)
[name] , Thank you for taking time to come for your Medicare Wellness Visit. I appreciate your ongoing commitment to your health goals. Please review the following plan we discussed and let me know if I can assist you in the future.   These are the goals we discussed:  Goals      Manage My Medicine     Timeframe:  Long-Range Goal Priority:  High Start Date:                             Expected End Date:                       Follow Up Date 11/11/2021   In Progress: - call for medicine refill 2 or 3 days before it runs out - call if I am sick and can't take my medicine - keep a list of all the medicines I take; vitamins and herbals too - use a pillbox to sort medicine - use an alarm clock or phone to remind me to take my medicine    Why is this important?   These steps will help you keep on track with your medicines.   Notes:  Please call with any questions      Patient Stated     Patient Stated     03/20/2020, wants to lose 20 pounds     Patient Stated     11/12/2020, get more active, wants to lose 25 pounds     Patient Stated     09/03/2021, wants to exercise 150 minutes a week     Patient Stated     09/09/2022, wants to increase exercise     Weight (lb) < 200 lb (90.7 kg)     03/08/2019 wants to get down to 220 pounds        This is a list of the screening recommended for you and due dates:  Health Maintenance  Topic Date Due   COVID-19 Vaccine (8 - 2023-24 season) 09/15/2022   Medicare Annual Wellness Visit  09/10/2023   DTaP/Tdap/Td vaccine (3 - Td or Tdap) 04/22/2031   Pneumonia Vaccine  Completed   Flu Shot  Completed   Hepatitis C Screening: USPSTF Recommendation to screen - Ages 80-79 yo.  Completed   Zoster (Shingles) Vaccine  Completed   HPV Vaccine  Aged Out   Colon Cancer Screening  Discontinued    Advanced directives: Please bring a copy of your POA (Power of Attorney) and/or Living Will to your next appointment.   Conditions/risks identified:  none  Next appointment: Follow up in one year for your annual wellness visit.   Preventive Care 4 Years and Older, Male  Preventive care refers to lifestyle choices and visits with your health care provider that can promote health and wellness. What does preventive care include? A yearly physical exam. This is also called an annual well check. Dental exams once or twice a year. Routine eye exams. Ask your health care provider how often you should have your eyes checked. Personal lifestyle choices, including: Daily care of your teeth and gums. Regular physical activity. Eating a healthy diet. Avoiding tobacco and drug use. Limiting alcohol use. Practicing safe sex. Taking low doses of aspirin every day. Taking vitamin and mineral supplements as recommended by your health care provider. What happens during an annual well check? The services and screenings done by your health care provider during your  annual well check will depend on your age, overall health, lifestyle risk factors, and family history of disease. Counseling  Your health care provider may ask you questions about your: Alcohol use. Tobacco use. Drug use. Emotional well-being. Home and relationship well-being. Sexual activity. Eating habits. History of falls. Memory and ability to understand (cognition). Work and work Statistician. Screening  You may have the following tests or measurements: Height, weight, and BMI. Blood pressure. Lipid and cholesterol levels. These may be checked every 5 years, or more frequently if you are over 41 years old. Skin check. Lung cancer screening. You may have this screening every year starting at age 53 if you have a 30-pack-year history of smoking and currently smoke or have quit within the past 15 years. Fecal occult blood test (FOBT) of the stool. You may have this test every year starting at age 45. Flexible sigmoidoscopy or colonoscopy. You may have a sigmoidoscopy every 5  years or a colonoscopy every 10 years starting at age 30. Prostate cancer screening. Recommendations will vary depending on your family history and other risks. Hepatitis C blood test. Hepatitis B blood test. Sexually transmitted disease (STD) testing. Diabetes screening. This is done by checking your blood sugar (glucose) after you have not eaten for a while (fasting). You may have this done every 1-3 years. Abdominal aortic aneurysm (AAA) screening. You may need this if you are a current or former smoker. Osteoporosis. You may be screened starting at age 30 if you are at high risk. Talk with your health care provider about your test results, treatment options, and if necessary, the need for more tests. Vaccines  Your health care provider may recommend certain vaccines, such as: Influenza vaccine. This is recommended every year. Tetanus, diphtheria, and acellular pertussis (Tdap, Td) vaccine. You may need a Td booster every 10 years. Zoster vaccine. You may need this after age 89. Pneumococcal 13-valent conjugate (PCV13) vaccine. One dose is recommended after age 67. Pneumococcal polysaccharide (PPSV23) vaccine. One dose is recommended after age 46. Talk to your health care provider about which screenings and vaccines you need and how often you need them. This information is not intended to replace advice given to you by your health care provider. Make sure you discuss any questions you have with your health care provider. Document Released: 08/29/2015 Document Revised: 04/21/2016 Document Reviewed: 06/03/2015 Elsevier Interactive Patient Education  2017 [place] Prevention in the Home Falls can cause injuries. They can happen to people of all ages. There are many things you can do to make your home safe and to help prevent falls. What can I do on the outside of my home? Regularly fix the edges of walkways and driveways and fix any cracks. Remove anything that might make you  trip as you walk through a door, such as a raised step or threshold. Trim any bushes or trees on the path to your home. Use bright outdoor lighting. Clear any walking paths of anything that might make someone trip, such as rocks or tools. Regularly check to see if handrails are loose or broken. Make sure that both sides of any steps have handrails. Any raised decks and porches should have guardrails on the edges. Have any leaves, snow, or ice cleared regularly. Use sand or salt on walking paths during winter. Clean up any spills in your garage right away. This includes oil or grease spills. What can I do in the bathroom? Use night lights. Install grab bars by the  toilet and in the tub and shower. Do not use towel bars as grab bars. Use non-skid mats or decals in the tub or shower. If you need to sit down in the shower, use a plastic, non-slip stool. Keep the floor dry. Clean up any water that spills on the floor as soon as it happens. Remove soap buildup in the tub or shower regularly. Attach bath mats securely with double-sided non-slip rug tape. Do not have throw rugs and other things on the floor that can make you trip. What can I do in the bedroom? Use night lights. Make sure that you have a light by your bed that is easy to reach. Do not use any sheets or blankets that are too big for your bed. They should not hang down onto the floor. Have a firm chair that has side arms. You can use this for support while you get dressed. Do not have throw rugs and other things on the floor that can make you trip. What can I do in the kitchen? Clean up any spills right away. Avoid walking on wet floors. Keep items that you use a lot in easy-to-reach places. If you need to reach something above you, use a strong step stool that has a grab bar. Keep electrical cords out of the way. Do not use floor polish or wax that makes floors slippery. If you must use wax, use non-skid floor wax. Do not have  throw rugs and other things on the floor that can make you trip. What can I do with my stairs? Do not leave any items on the stairs. Make sure that there are handrails on both sides of the stairs and use them. Fix handrails that are broken or loose. Make sure that handrails are as long as the stairways. Check any carpeting to make sure that it is firmly attached to the stairs. Fix any carpet that is loose or worn. Avoid having throw rugs at the top or bottom of the stairs. If you do have throw rugs, attach them to the floor with carpet tape. Make sure that you have a light switch at the top of the stairs and the bottom of the stairs. If you do not have them, ask someone to add them for you. What else can I do to help prevent falls? Wear shoes that: Do not have high heels. Have rubber bottoms. Are comfortable and fit you well. Are closed at the toe. Do not wear sandals. If you use a stepladder: Make sure that it is fully opened. Do not climb a closed stepladder. Make sure that both sides of the stepladder are locked into place. Ask someone to hold it for you, if possible. Clearly mark and make sure that you can see: Any grab bars or handrails. First and last steps. Where the edge of each step is. Use tools that help you move around (mobility aids) if they are needed. These include: Canes. Walkers. Scooters. Crutches. Turn on the lights when you go into a dark area. Replace any light bulbs as soon as they burn out. Set up your furniture so you have a clear path. Avoid moving your furniture around. If any of your floors are uneven, fix them. If there are any pets around you, be aware of where they are. Review your medicines with your doctor. Some medicines can make you feel dizzy. This can increase your chance of falling. Ask your doctor what other things that you can do to help prevent falls.  This information is not intended to replace advice given to you by your health care provider.  Make sure you discuss any questions you have with your health care provider. Document Released: 05/29/2009 Document Revised: 01/08/2016 Document Reviewed: 09/06/2014 Elsevier Interactive Patient Education  2017 [employer].

## 2022-09-09 NOTE — Progress Notes (Signed)
Subjective:   Kevin Weiss is a 82 y.o. male who presents for Medicare Annual/Subsequent preventive examination.  Review of Systems     Cardiac Risk Factors include: advanced age (>40mn, >>54women);hypertension;male gender;obesity (BMI >30kg/m2)     Objective:    Today's Vitals   09/09/22 1406  BP: 118/70  Pulse: 97  Temp: 98.2 F (36.8 C)  TempSrc: Oral  SpO2: 98%  Weight: 249 lb 9.6 oz (113.2 kg)  Height: 5' 6.6" (1.692 m)   Body mass index is 39.56 kg/m.     09/09/2022    2:15 PM 09/03/2021    9:18 AM 11/12/2020   11:16 AM 03/20/2020   10:15 AM 03/08/2019   11:14 AM 05/05/2018   11:53 AM 04/11/2018    4:00 PM  Advanced Directives  Does Patient Have a Medical Advance Directive? Yes Yes Yes Yes Yes Yes Yes  Type of AParamedicof ACarnelian BayLiving will HRosedaleLiving will H[place]teLiving will HWoodstownLiving will HRansomLiving will Living will HWoodburyLiving will  Does patient want to make changes to medical advance directive?     No - Patient declined  No - Patient declined  Copy of HClaytonin Chart? No - copy requested No - copy requested No - copy requested No - copy requested No - copy requested      Current Medications (verified) Outpatient Encounter Medications as of 09/09/2022  Medication Sig   aspirin 81 MG tablet Take 81 mg by mouth daily.   Cholecalciferol (VITAMIN D-3) 1000 UNITS CAPS Take 1 capsule by mouth daily.   ferrous sulfate 325 (65 FE) MG tablet TAKE 1 TABLET (325 MG TOTAL) BY MOUTH 2 (TWICE) TIMES DAILY WITH MEALS.   hydrochlorothiazide (MICROZIDE) 12.5 MG capsule TAKE 1 CAPSULE BY MOUTH EVERY DAY   hydrocortisone 2.5 % ointment Apply topically 2 (two) times daily.   LINZESS 145 MCG CAPS capsule TAKE 1 CAPSULE BY MOUTH DAILY BEFORE BREAKFAST.   lisinopril (ZESTRIL) 10 MG tablet Take 1 tablet (10 mg  total) by mouth daily.   Multiple Vitamins-Minerals (CENTRUM SILVER ADULT 50+) TABS Take 1 tablet by mouth daily.   rosuvastatin (CRESTOR) 10 MG tablet TAKE 1 TABLET BY MOUTH ON Monday, Wednesday AND FRIDAYS   sildenafil (VIAGRA) 100 MG tablet Take 100 mg by mouth daily as needed.   Vibegron (GEMTESA) 75 MG TABS Take by mouth.   No facility-administered encounter medications on file as of 09/09/2022.    Allergies (verified) Patient has no known allergies.   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   Qualifier: Diagnosis of  By: MEusebio Friendly    Morbid obesity ([place] 11/03/2015   OSA on CPAP 11/03/2015   Retrognathia 11/03/2015   Past Surgical History:  Procedure Laterality Date   COLONOSCOPY     KNEE ARTHROSCOPY Left 15+yrs  ago   TOTAL HIP ARTHROPLASTY Right 03/12/2014   Procedure: TOTAL HIP ARTHROPLASTY ANTERIOR APPROACH;  Surgeon: Hessie Dibble, MD;  Location: [place];  Service: Orthopedics;  Laterality: Right;   TOTAL HIP ARTHROPLASTY Left 04/11/2018   Procedure: TOTAL HIP ARTHROPLASTY ANTERIOR APPROACH;  Surgeon: Melrose Nakayama, MD;  Location: [place];  Service: Orthopedics;  Laterality: Left;   Family History  Problem Relation Age of Onset   Hypertension Mother    Hyperlipidemia Mother    Alzheimer's disease Mother    Cancer Mother        type unbknown   Heart disease Father    Hyperlipidemia Father    Hypertension Father    Hypertension Sister    Hypertension  Brother    Heart attack Maternal Grandmother    Sleep apnea Neg Hx    Social History   Socioeconomic History   Marital status: Married    Spouse name: Not on file   Number of children: 0   Years of education: Not on file   Highest education level: Not on file  Occupational History   Occupation: retired  Tobacco Use   Smoking status: Former    Packs/day: 0.25    Years: 10.00    Total pack years: 2.50    Types: Cigarettes    Quit date: 1982    Years since quitting: 42.0   Smokeless tobacco: Never   Tobacco comments:    57 + years  Vaping Use   Vaping Use: Never used  Substance and Sexual Activity   Alcohol use: Yes    Comment: social once every 3-4 months   Drug use: Not Currently    Types: Marijuana    Comment: 03/30/18   Sexual activity: Yes  Other Topics Concern   Not on file  Social History Narrative   Live at home with wife. Retired from [employer] as a Librarian, academic.    Social Determinants of Health   Financial Resource Strain: Low Risk  (09/09/2022)   Overall Financial Resource Strain (CARDIA)    Difficulty of Paying Living Expenses: Not hard at all  Food Insecurity: No Food Insecurity (09/09/2022)   Hunger Vital Sign    Worried About Running Out of Food in the Last Year: Never true    Ran Out of Food in the Last Year: Never true  Transportation Needs: No Transportation Needs (09/09/2022)   PRAPARE - Hydrologist (Medical): No    Lack of Transportation (Non-Medical): No  Physical Activity: Insufficiently Active (09/09/2022)   Exercise Vital Sign    Days of Exercise per Week: 3 days    Minutes of Exercise per Session: 20 min  Stress: No Stress Concern Present (09/09/2022)   [place]    Feeling of Stress : Not at all  Social Connections: Not on file    Tobacco Counseling Counseling given: Not Answered Tobacco comments: 40 +  years   Clinical Intake:  Pre-visit preparation completed: Yes  Pain : No/denies pain     Nutritional Status: BMI > 30  Obese Nutritional Risks: None Diabetes: No  How often do you need to have someone help you when you read instructions, pamphlets, or other written materials from your doctor or pharmacy?: 1 - Never  Diabetic? no  Interpreter Needed?: No  Information entered by :: NAllen LPN   Activities of Daily Living    09/09/2022    2:16 PM  In  your present state of health, do you have any difficulty performing the following activities:  Hearing? 1  Comment getting hearing checked, decreased hearing right ear  Vision? 0  Difficulty concentrating or making decisions? 0  Walking or climbing stairs? 0  Dressing or bathing? 0  Doing errands, shopping? 0  Preparing Food and eating ? N  Using the Toilet? N  In the past six months, have you accidently leaked urine? N  Do you have problems with loss of bowel control? N  Managing your Medications? N  Managing your Finances? N  Housekeeping or managing your Housekeeping? N    Patient Care Team: [name], FNP as PCP - General (General Practice) Mayford Knife, [facility] (Pharmacist)  Indicate any recent Medical Services you may have received from other than [facility] providers in the past year (date may be approximate).     Assessment:   This is a routine wellness examination for [place].  Hearing/Vision screen Vision Screening - Comments:: Regular eye exams, [facility]  Dietary issues and exercise activities discussed: Current Exercise Habits: Home exercise routine, Type of exercise: strength training/weights;calisthenics, Time (Minutes): 20, Frequency (Times/Week): 3, Weekly Exercise (Minutes/Week): 60   Goals Addressed             This Visit's Progress    Patient Stated       09/09/2022, wants to increase exercise       Depression Screen    09/09/2022    2:16 PM 07/20/2022    2:43 PM 04/15/2022    10:56 AM 09/03/2021    9:23 AM 11/12/2020   11:18 AM 03/20/2020   10:16 AM 03/08/2019   11:15 AM  PHQ 2/9 Scores  PHQ - 2 Score 0 0 0 0 0 0 0  PHQ- 9 Score      0 0    Fall Risk    09/09/2022    2:16 PM 07/20/2022    2:43 PM 04/15/2022   10:56 AM 09/03/2021    9:19 AM 11/12/2020   11:16 AM  [place] in the past year? 0 0 0 1 1  Comment    fell off a ladder and platform, tripped on stairs fell off ladder  Number falls in past yr: 0 0 0 1 0  Injury with Fall? 0 0 0 0 0  Risk for fall due to : Medication side effect No Fall Risks No Fall Risks Medication side effect Medication side effect  Follow up Falls prevention discussed;Education provided;Falls evaluation completed Falls evaluation completed Falls evaluation completed Falls evaluation completed;Education provided;Falls prevention discussed Falls evaluation completed;Education provided;Falls prevention discussed    FALL RISK PREVENTION PERTAINING TO THE HOME:  Any stairs in or around the home? Yes  If so, are there any without handrails? No  Home free of loose throw rugs in walkways, pet beds, electrical cords, etc? Yes  Adequate lighting in your home to reduce risk of falls? Yes   ASSISTIVE DEVICES UTILIZED TO PREVENT FALLS:  Life alert? No  Use of a cane, walker or w/c? No  Grab bars in the bathroom? No  Shower chair or bench in shower? Yes  Elevated toilet seat or a handicapped toilet? Yes   TIMED UP AND GO:  Was the test performed? Yes .  Length of time to ambulate 10 feet: 5 sec.   Gait steady and fast without use of assistive device  Cognitive Function:        09/09/2022  2:19 PM 09/03/2021    9:31 AM 11/12/2020   11:21 AM 03/20/2020   10:21 AM 03/08/2019   11:19 AM  6CIT Screen  What Year? 0 points 0 points 0 points 4 points 0 points  What month? 0 points 0 points 0 points 0 points 0 points  What time? 0 points 0 points 3 points 0 points 0 points  Count back from 20 0 points 0 points 0 points 0 points  0 points  Months in reverse 0 points 2 points 0 points 0 points 0 points  Repeat phrase 0 points 0 points 2 points 2 points 0 points  Total Score 0 points 2 points 5 points 6 points 0 points    Immunizations Immunization History  Administered Date(s) Administered   COVID-19, mRNA, vaccine(Comirnaty)12 years and older 07/21/2022   Fluad Quad(high Dose 65+) 06/27/2020, 06/09/2021, 07/20/2022   Influenza, High Dose Seasonal PF 06/14/2019   Influenza-Unspecified 03/16/2014   PFIZER Comirnaty(Gray Top)Covid-19 Tri-Sucrose Vaccine 01/21/2021, 07/02/2021   PFIZER(Purple Top)SARS-COV-2 Vaccination 08/28/2019, 09/17/2019, 05/20/2020   Pfizer Covid-19 Vaccine Bivalent Booster 50yr & up 12/18/2021, 07/21/2022   Pneumococcal Conjugate-13 06/24/2020   Pneumococcal Polysaccharide-23 03/14/2014, 04/13/2018   Td 08/16/1996   Tdap 04/21/2021   Zoster Recombinat (Shingrix) 04/21/2021, 07/02/2021    TDAP status: Up to date  Flu Vaccine status: Up to date  Pneumococcal vaccine status: Up to date  Covid-19 vaccine status: Completed vaccines  Qualifies for Shingles Vaccine? Yes   Zostavax completed Yes   Shingrix Completed?: Yes  Screening Tests Health Maintenance  Topic Date Due   COVID-19 Vaccine (8 - 2023-24 season) 09/15/2022   Medicare Annual Wellness (AWV)  09/10/2023   DTaP/Tdap/Td (3 - Td or Tdap) 04/22/2031   Pneumonia Vaccine 80 Years old  Completed   INFLUENZA VACCINE  Completed   Hepatitis C Screening  Completed   Zoster Vaccines- Shingrix  Completed   HPV VACCINES  Aged Out   COLONOSCOPY (Pts 45-475yrInsurance coverage will need to be confirmed)  Discontinued    Health Maintenance  There are no preventive care reminders to display for this patient.   Colorectal cancer screening: No longer required.   Lung Cancer Screening: (Low Dose CT Chest recommended if Age 80-80ears, 30 pack-year currently smoking OR have quit w/in 15years.) does not qualify.   Lung Cancer  Screening Referral: no  Additional Screening:  Hepatitis C Screening: does qualify; Completed 05/11/2021  Vision Screening: Recommended annual ophthalmology exams for early detection of glaucoma and other disorders of the eye. Is the patient up to date with their annual eye exam?  Yes  Who is the provider or what is the name of the office in which the patient attends annual eye exams? [facility], would they like to be referred to a provider to establish care? No .   Dental Screening: Recommended annual dental exams for proper oral hygiene  Community Resource Referral / Chronic Care Management: CRR required this visit?  No   CCM required this visit?  No      Plan:     I have personally reviewed and noted the following in the patient's chart:   Medical and social history Use of alcohol, tobacco or illicit drugs  Current medications and supplements including opioid prescriptions. Patient is not currently taking opioid prescriptions. Functional ability and status Nutritional status Physical activity Advanced directives List of other physicians Hospitalizations, surgeries, and ER visits in previous 12 months Vitals  Screenings to include cognitive, depression, and falls Referrals and appointments  In addition, I have reviewed and discussed with patient certain preventive protocols, quality metrics, and best practice recommendations. A written personalized care plan for preventive services as well as general preventive health recommendations were provided to patient.     Kellie Simmering, LPN   9/47/0761   Nurse Notes: none

## 2022-10-18 NOTE — Telephone Encounter (Signed)
Pt states he is returning a call to [place], Therapist, sports. Please call pt.

## 2022-10-18 NOTE — Telephone Encounter (Signed)
I did call pt and let him know that did send order to [facility].  [facility] did respond that would let tracey know and may get loaner, but I told him that I did not send order to them.  He was ok with that.  He appreciated call back.

## 2022-10-18 NOTE — Telephone Encounter (Signed)
I called  pt back.  He stated that [facility] told him that he was eligible for new machine,  looking into [facility] [id number] was when set up.   Since his machine has stopped working needs new machine.  I relayed that he can change DME co or stay with [facility].  He said it would take about 1 month to get new machine with [facility].  Would try [facility].  I relayed if needing new machine, may require a HST but will let him know.  Pt last seen in office 09-01-2022.  Last sleep study was 2017 cpap titration.

## 2022-10-18 NOTE — Telephone Encounter (Signed)
Coltrane, Phylliss Blakes, RN; Delacroix, Leatrice Jewels; Adams, Melissa L I will have Tracee reach out. We may be able to do a loaner until then.     Previous Messages    ----- Message ----- From: Kevin Melnick, RN Sent: 10/18/2022   1:35 PM EST To: Angelia Mould; Terri Coltrane Subject: cpap machine stopped working this am          Hi Terri,  This pt contacted [place] about machine stopped working this am at [employer].  He is not due for a new machine until 05-2023 (97yr).  I told him to contact you first and see what you all say/recommend.    [name] Male, 82y.o., [id number]: he/him/his MRN: [id number]: [phone]  Thanks  SLovey NewcomerRN

## 2022-10-18 NOTE — Addendum Note (Signed)
Addended by: Trudie Buckler on: 10/18/2022 04:03 PM   Modules accepted: Orders

## 2022-10-18 NOTE — Telephone Encounter (Signed)
Order placed for new machine

## 2022-10-18 NOTE — Telephone Encounter (Signed)
Pt called and stated his CPAP machine has stopped working and has no power. Pt is requesting a call back from nurse to discuss.

## 2022-10-18 NOTE — Telephone Encounter (Signed)
I called pt.  He said his machine stopped working at [place] this am.  He has back up generator for issues if power goes off.  He iwll call [facility] to see what they say/recommend.  I sent high priority message to [place].

## 2022-10-18 NOTE — Telephone Encounter (Signed)
Faxed to [facility] for new machine.  With fax confirmation to try and get new machine asap.

## 2022-10-25 ENCOUNTER — Other Ambulatory Visit: Payer: Self-pay | Admitting: Nurse Practitioner

## 2022-10-26 MED ORDER — HYDROCORTISONE 2.5 % EX OINT: TOPICAL_OINTMENT | Freq: Two times a day (BID) | CUTANEOUS | 0 refills | Status: DC

## 2022-10-27 NOTE — Telephone Encounter (Signed)
Pt has called to inform Bethany, RN that he has new CPAP. DME informed him to schedule between 11/27/22 and 01/25/23 pt has been scheduled for 11/30/22 and he is aware to check in at 8:30 for 9:00 appointment with his CPAP and power cord, this is FYI no call back requested

## 2022-10-27 NOTE — Telephone Encounter (Signed)
Wonderful, thank you

## 2022-11-22 ENCOUNTER — Ambulatory Visit: Payer: Medicare HMO | Admitting: Nurse Practitioner

## 2022-11-22 DIAGNOSIS — R748 Abnormal levels of other serum enzymes: Secondary | ICD-10-CM

## 2022-11-22 DIAGNOSIS — D509 Iron deficiency anemia, unspecified: Secondary | ICD-10-CM

## 2022-11-22 DIAGNOSIS — Z860100 Personal history of colon polyps, unspecified: Secondary | ICD-10-CM

## 2022-11-22 DIAGNOSIS — K5900 Constipation, unspecified: Secondary | ICD-10-CM

## 2022-11-22 HISTORY — DX: Iron deficiency anemia, unspecified: D50.9

## 2022-11-22 HISTORY — DX: Personal history of colon polyps, unspecified: Z86.0100

## 2022-11-22 HISTORY — DX: Constipation, unspecified: K59.00

## 2022-11-22 HISTORY — DX: Abnormal levels of other serum enzymes: R74.8

## 2022-11-27 ENCOUNTER — Other Ambulatory Visit: Payer: Self-pay | Admitting: Nurse Practitioner

## 2022-11-27 DIAGNOSIS — E782 Mixed hyperlipidemia: Secondary | ICD-10-CM

## 2022-11-29 NOTE — Progress Notes (Unsigned)
PATIENT: Kevin Weiss DOB: 1940-11-09  REASON FOR VISIT: follow up HISTORY FROM: patient PRIMARY NEUROLOGIST: Dr. Vickey Huger  Chief Complaint  Patient presents with   Follow-up    Pt in 8 Pt here for CPAP f/u Pt states wants to discuss pressure on CPAP machine      HISTORY OF PRESENT ILLNESS: Today 11/30/22  Kevin Weiss is a 82 y.o. male with a history of OSA on CPAP. Returns today for follow-up.  Reports that the new CPAP is working well.  Denies any new issues.  Continues to find it beneficial.     09/01/22: Kevin Weiss is a 82 y.o. male with a history of OSA on CPAP. Returns today for follow-up. No issues with the machine. Continues to see the benefit. Feels well rested. Doesn't require naps. Goes to bed around 1AM and wakes up at 9AM.         REVIEW OF SYSTEMS: Out of a complete 14 system review of symptoms, the patient complains only of the following symptoms, and all other reviewed systems are negative.  ESS3    ALLERGIES: No Known Allergies  HOME MEDICATIONS: Outpatient Medications Prior to Visit  Medication Sig Dispense Refill   aspirin 81 MG tablet Take 81 mg by mouth daily.     Cholecalciferol (VITAMIN D-3) 1000 UNITS CAPS Take 1 capsule by mouth daily.     ferrous sulfate 325 (65 FE) MG tablet TAKE 1 TABLET (325 MG TOTAL) BY MOUTH 2 (TWICE) TIMES DAILY WITH MEALS. 180 tablet 1   hydrochlorothiazide (MICROZIDE) 12.5 MG capsule TAKE 1 CAPSULE BY MOUTH EVERY DAY 90 capsule 1   hydrocortisone 2.5 % ointment Apply topically 2 (two) times daily. 30 g 0   LINZESS 145 MCG CAPS capsule TAKE 1 CAPSULE BY MOUTH DAILY BEFORE BREAKFAST. 90 capsule 2   lisinopril (ZESTRIL) 10 MG tablet Take 1 tablet (10 mg total) by mouth daily. 90 tablet 0   Multiple Vitamins-Minerals (CENTRUM SILVER ADULT 50+) TABS Take 1 tablet by mouth daily.     rosuvastatin (CRESTOR) 10 MG tablet TAKE 1 TABLET BY MOUTH ON MONDAY, WEDNESDAY AND FRIDAYS 36 tablet 4   sildenafil  (VIAGRA) 100 MG tablet Take 100 mg by mouth daily as needed.     Vibegron (GEMTESA) 75 MG TABS Take by mouth.     No facility-administered medications prior to visit.    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   Qualifier: Diagnosis of  By: Abundio Miu     Morbid obesity 11/03/2015   OSA on CPAP 11/03/2015   Retrognathia 11/03/2015    PAST SURGICAL HISTORY: Past Surgical History:  Procedure Laterality Date   COLONOSCOPY     KNEE ARTHROSCOPY Left 15+yrs ago   TOTAL HIP ARTHROPLASTY Right 03/12/2014   Procedure: TOTAL HIP ARTHROPLASTY ANTERIOR APPROACH;  Surgeon: Velna Ochs, MD;  Location[facility];  Service: Orthopedics;  Laterality: Right;   TOTAL HIP ARTHROPLASTY Left 04/11/2018   Procedure: TOTAL HIP ARTHROPLASTY ANTERIOR APPROACH;  Surgeon: Marcene Corning, MD;  Location: [facility];  Service: Orthopedics;  Laterality: Left;    FAMILY HISTORY: Family History  Problem Relation Age of Onset   Hypertension Mother    Hyperlipidemia Mother    Alzheimer's disease Mother    Cancer Mother        type unbknown   Heart disease Father    Hyperlipidemia Father    Hypertension Father    Hypertension Sister    Hypertension Brother    Heart attack Maternal Grandmother    Sleep apnea Neg Hx     SOCIAL HISTORY: Social History   Socioeconomic History   Marital status: Married    Spouse name: Not on file   Number of children: 0   Years of education: Not on  file   Highest education level: Not on file  Occupational History   Occupation: retired  Tobacco Use   Smoking status: Former    Packs/day: 0.25    Years: 10.00    Additional pack years: 0.00    Total pack years: 2.50    Types: Cigarettes    Quit date: 1982    Years since quitting: 42.3   Smokeless tobacco: Never   Tobacco comments:    40 + years  Vaping Use   Vaping Use: Never used  Substance and Sexual Activity   Alcohol use: Yes    Comment: social once every 3-4 months   Drug use: Not Currently    Types: Marijuana    Comment: 03/30/18   Sexual activity: Yes  Other Topics Concern   Not on file  Social History Narrative   Live at home with wife. Retired from [employer] as a Merchandiser, retail.    Social Determinants of Health   Financial Resource Strain: Low Risk  (09/09/2022)   Overall Financial Resource Strain (CARDIA)    Difficulty of Paying Living Expenses: Not hard at all  Food Insecurity: No Food Insecurity (09/09/2022)   Hunger Vital Sign    Worried About Running Out of Food in the Last Year: Never true    Ran Out of Food in the Last Year: Never true  Transportation Needs: No Transportation Needs (09/09/2022)   PRAPARE - Administrator, Civil Service (Medical): No    Lack of Transportation (Non-Medical): No  Physical Activity: Insufficiently Active (09/09/2022)   Exercise Vital Sign    Days of Exercise per Week: 3 days    Minutes of Exercise per Session: 20 min  Stress: No Stress Concern Present (09/09/2022)   [employer] of Occupational Health - Occupational Stress Questionnaire    Feeling of Stress : Not at all  Social Connections: Not on file  Intimate Partner Violence: Not At Risk (03/08/2019)   Humiliation, Afraid, Rape, and Kick questionnaire    Fear of Current or Ex-Partner: No    Emotionally Abused: No    Physically Abused: No    Sexually Abused: No      PHYSICAL EXAM  Vitals:   11/30/22 0853  BP: (!) 143/86   Pulse: 81  Weight: 250 lb (113.4 kg)  Height: 5\' 7"  (1.702 m)   Body mass index is 39.16 kg/m.  Generalized: Well developed, in no acute distress  Chest: Lungs clear to auscultation bilaterally  Neurological examination  Mentation: Alert oriented to time, place, history taking. Follows all commands speech and language fluent Cranial nerve II-XII:  Extraocular movements were full, visual field were full on confrontational test Head turning and shoulder shrug  were normal and symmetric.   Gait and station: Gait is normal.    DIAGNOSTIC DATA (LABS, IMAGING, TESTING) - I reviewed patient records, labs, notes, testing and imaging myself where available.  Lab Results  Component Value Date   WBC 6.3 07/20/2022   HGB 15.1 07/20/2022   HCT 46.1 07/20/2022   MCV 88 07/20/2022   PLT 307 07/20/2022      Component Value Date/Time   NA 143 07/20/2022 1534   K 4.2 07/20/2022 1534   CL 103 07/20/2022 1534   CO2 23 07/20/2022 1534   GLUCOSE 91 07/20/2022 1534   GLUCOSE 138 (H) 04/12/2018 0358   BUN 16 07/20/2022 1534   CREATININE 1.11 07/20/2022 1534   CALCIUM 9.8 07/20/2022 1534   PROT 7.4 07/20/2022 1534   ALBUMIN 4.6 07/20/2022 1534   AST 29 07/20/2022 1534   ALT 31 07/20/2022 1534   ALKPHOS 153 (H) 07/20/2022 1534   BILITOT 0.3 07/20/2022 1534   GFRNONAA 51 (L) 09/10/2020 1658   GFRAA 59 (L) 09/10/2020 1658   Lab Results  Component Value Date   CHOL 127 07/20/2022   HDL 34 (L) 07/20/2022   LDLCALC 73 07/20/2022   TRIG 107 07/20/2022   CHOLHDL 3.7 07/20/2022   Lab Results  Component Value Date   HGBA1C 5.9 (H) 07/20/2022   Lab Results  Component Value Date   VITAMINB12 815 08/05/2021   Lab Results  Component Value Date   TSH 1.750 08/05/2021      ASSESSMENT AND PLAN 82 y.o. year old male  has a past medical history of Arthritis, Cardiomegaly (10/13/2006), CHF, EJECTION FRACTION > OR = 50% (10/13/2006), Degenerative joint disease (DJD) of hip (03/12/2014),  GASTROESOPHAGEAL REFLUX, NO ESOPHAGITIS (10/13/2006), HEMORRHOIDS, NOS (10/13/2006), History of colon polyps, Hypertension, IMPOTENCE, ORGANIC (10/13/2006), Morbid obesity (11/03/2015), OSA on CPAP (11/03/2015), and Retrognathia (11/03/2015). here with:  OSA on CPAP  - CPAP compliance excellent - Good treatment of AHI  - Encourage patient to use CPAP nightly and > 4 hours each night - F/U in 1 year or sooner if needed    Butch Penny, MSN, NP-C 11/30/2022, 9:03 AM [facility] [address], [address] [place], [place] [address] [phone]

## 2022-11-30 ENCOUNTER — Encounter: Payer: Self-pay | Admitting: Adult Health

## 2022-11-30 ENCOUNTER — Ambulatory Visit (INDEPENDENT_AMBULATORY_CARE_PROVIDER_SITE_OTHER): Payer: Medicare HMO | Admitting: Adult Health

## 2022-11-30 VITALS — BP 143/86 | HR 81 | Ht 67.0 in | Wt 250.0 lb

## 2022-11-30 DIAGNOSIS — G4733 Obstructive sleep apnea (adult) (pediatric): Secondary | ICD-10-CM

## 2022-11-30 NOTE — Patient Instructions (Signed)
Continue using CPAP nightly and greater than 4 hours each night °If your symptoms worsen or you develop new symptoms please let us know.  ° °

## 2022-12-09 ENCOUNTER — Other Ambulatory Visit: Payer: Self-pay | Admitting: Nurse Practitioner

## 2022-12-09 DIAGNOSIS — D508 Other iron deficiency anemias: Secondary | ICD-10-CM

## 2022-12-13 ENCOUNTER — Other Ambulatory Visit: Payer: Self-pay

## 2022-12-13 DIAGNOSIS — D508 Other iron deficiency anemias: Secondary | ICD-10-CM

## 2022-12-13 MED ORDER — FERROUS SULFATE 325 (65 FE) MG PO TABS: ORAL_TABLET | ORAL | 1 refills | Status: DC

## 2022-12-14 ENCOUNTER — Encounter: Payer: Self-pay | Admitting: Nurse Practitioner

## 2022-12-14 ENCOUNTER — Ambulatory Visit (INDEPENDENT_AMBULATORY_CARE_PROVIDER_SITE_OTHER): Payer: Medicare HMO | Admitting: Family Medicine

## 2022-12-14 VITALS — BP 124/72 | HR 78 | Temp 98.1°F | Ht 66.0 in | Wt 252.2 lb

## 2022-12-14 DIAGNOSIS — R7303 Prediabetes: Secondary | ICD-10-CM

## 2022-12-14 DIAGNOSIS — R319 Hematuria, unspecified: Secondary | ICD-10-CM

## 2022-12-14 DIAGNOSIS — I5032 Chronic diastolic (congestive) heart failure: Secondary | ICD-10-CM

## 2022-12-14 DIAGNOSIS — I110 Hypertensive heart disease with heart failure: Secondary | ICD-10-CM

## 2022-12-14 DIAGNOSIS — Z6841 Body Mass Index (BMI) 40.0 and over, adult: Secondary | ICD-10-CM

## 2022-12-14 DIAGNOSIS — M7989 Other specified soft tissue disorders: Secondary | ICD-10-CM

## 2022-12-14 LAB — POCT URINALYSIS DIPSTICK
Bilirubin, UA: NEGATIVE
Glucose, UA: NEGATIVE
Ketones, UA: NEGATIVE
Leukocytes, UA: NEGATIVE
Nitrite, UA: NEGATIVE
Protein, UA: NEGATIVE
Spec Grav, UA: 1.025 (ref 1.010–1.025)
Urobilinogen, UA: 0.2 E.U./dL
pH, UA: 5.5 (ref 5.0–8.0)

## 2022-12-14 NOTE — Progress Notes (Signed)
I,Jameka J Llittleton,acting as a Neurosurgeon for [employer], NP.,have documented all relevant documentation on the behalf of Sricharan Lacomb, NP,as directed by  Daniela Siebers Moshe Salisbury, NP while in the presence of Junetta Hearn, NP.    Subjective:     Patient ID: Kevin Weiss , male    DOB: 10/08/1940 , 82 y.o.   MRN: [id number]   Chief Complaint  Patient presents with   Hypertension    HPI  Patient presents today for  follow up with his BP and prediabetes check. Patient reports compliance with his med but states he has been eating lots of fried foods, he states he is active by moving but doesn't have a routine exercise schedule. Patient stated he still had concerns about the swelling on his left foot. He stated he did go see a podiatrist but he wasn't pleased with him regarding the swelling but the provider was more focused with the fungus on his toenails, so he would like to be referred to another specialist to know the reason for the swelling.  Wt Readings from Last 3 Encounters: 12/14/22 : 252 lb 3.2 oz (114.4 kg) 11/30/22 : 250 lb (113.4 kg) 09/09/22 : 249 lb 9.6 oz (113.2 kg)    Hypertension This is a chronic problem. The current episode started more than 1 year ago. The problem is uncontrolled. Pertinent negatives include no headaches. There are no associated agents to hypertension. Risk factors for coronary artery disease include obesity and sedentary lifestyle. There are no compliance problems.  There is no history of chronic renal disease.     Past Medical History:  Diagnosis Date   Arthritis    Cardiomegaly 10/13/2006   Qualifier: Diagnosis of  By: Abundio Miu     CHF, EJECTION FRACTION > OR = 50% 10/13/2006   Qualifier: Diagnosis of  By: Abundio Miu     Degenerative joint disease (DJD) of hip 03/12/2014   GASTROESOPHAGEAL REFLUX, NO ESOPHAGITIS 10/13/2006   patient denies   HEMORRHOIDS, NOS 10/13/2006   Qualifier: Diagnosis of  By: Abundio Miu     History of colon  polyps    Hypertension    takes Amlodipine and Prinizide daily   IMPOTENCE, 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   Qualifier: Diagnosis of  By: Abundio Miu     Morbid obesity (HCC) 11/03/2015   OSA on CPAP 11/03/2015   Retrognathia 11/03/2015     Family History  Problem Relation Age of Onset   Hypertension Mother    Hyperlipidemia Mother    Alzheimer's disease Mother    Cancer Mother        type unbknown   Heart disease Father    Hyperlipidemia Father    Hypertension Father    Hypertension Sister    Hypertension Brother    Heart attack Maternal Grandmother    Sleep apnea Neg Hx      Current Outpatient Medications:    aspirin 81 MG tablet, Take 81 mg by mouth daily., Disp: , Rfl:    Cholecalciferol (VITAMIN D-3) 1000 UNITS CAPS, Take 1 capsule by mouth daily., Disp: , Rfl:    ferrous sulfate 325 (65 FE) MG tablet, Take 1 tablet by mouth 2 times daily., Disp: 180 tablet, Rfl: 1   hydrochlorothiazide (MICROZIDE) 12.5 MG capsule, TAKE 1 CAPSULE BY MOUTH EVERY DAY, Disp: 90 capsule, Rfl: 1   hydrocortisone 2.5 % ointment, Apply topically 2 (two) times daily., Disp: 30 g, Rfl: 0   LINZESS 145 MCG CAPS capsule, TAKE 1 CAPSULE BY MOUTH DAILY BEFORE  BREAKFAST., Disp: 90 capsule, Rfl: 2   lisinopril (ZESTRIL) 10 MG tablet, Take 1 tablet (10 mg total) by mouth daily., Disp: 90 tablet, Rfl: 0   Multiple Vitamins-Minerals (CENTRUM SILVER ADULT 50+) TABS, Take 1 tablet by mouth daily., Disp: , Rfl:    rosuvastatin (CRESTOR) 10 MG tablet, TAKE 1 TABLET BY MOUTH ON MONDAY, WEDNESDAY AND FRIDAYS, Disp: 36 tablet, Rfl: 4   sildenafil (VIAGRA) 100 MG tablet, Take 100 mg by mouth daily as needed., Disp: , Rfl:    Vibegron (GEMTESA) 75 MG TABS, Take by mouth., Disp: , Rfl:    No Known Allergies   Review of Systems  Constitutional: Negative.   Respiratory: Negative.    Cardiovascular:  Positive for leg swelling.  Endocrine: Negative.   Genitourinary: Negative.   Neurological:  Negative for headaches.      Today's Vitals   12/14/22 1039  BP: 124/72  Pulse: 78  Temp: 98.1 F (36.7 C)  Weight: 252 lb 3.2 oz (114.4 kg)  Height: 5\' 6"  (1.676 m)  PainSc: 0-No pain   Body mass index is 40.71 kg/m.   Objective:  Physical Exam Constitutional:      Appearance: Normal appearance.  Cardiovascular:     Rate and Rhythm: Normal rate and regular rhythm.     Pulses: Normal pulses.     Heart sounds: Normal heart sounds.  Abdominal:     General: Bowel sounds are normal.  Musculoskeletal:     Left lower leg: Edema present.     Comments: Slight edema, non pitting  Neurological:     Mental Status: He is alert.         Assessment And Plan:     1. Hypertensive heart disease with chronic diastolic congestive heart failure (HCC) Comments: continue taking  medications as prescribed.. CMP + eGFr ordered - CMP14+EGFR - CBC no Diff - POCT Urinalysis Dipstick (81002) - Microalbumin / Creatinine Urine Ratio  2. Prediabetes Comments: Hemoglobin A1c ordered - Hemoglobin A1c  3. Class 3 severe obesity due to excess calories with body mass index (BMI) of 40.0 to 44.9 in adult, unspecified whether serious comorbidity present [facility]) Comments: 30 minutes of daily walking for 3-4 days encouraged,substituting baked, broiled and grilled  items instead of fried items advised..  4. Hematuria, unspecified type Comments: slight blood noted urine, will send off for culture - Urine Culture  5. Swelling of left extremity - Ambulatory referral to Podiatry     Patient was given opportunity to ask questions. Patient verbalized understanding of the plan and was able to repeat key elements of the plan. All questions were answered to their satisfaction.  Gorje Iyer Moshe Salisbury, NP   I, Herbert Aguinaldo Moshe Salisbury, NP, have reviewed all documentation for this visit. The documentation on 12/15/22 for the exam, diagnosis, procedures, and orders are all accurate and complete.   IF YOU HAVE BEEN REFERRED TO A SPECIALIST, IT MAY TAKE 1-2  WEEKS TO SCHEDULE/PROCESS THE REFERRAL. IF YOU HAVE NOT HEARD FROM US/SPECIALIST IN TWO WEEKS, PLEASE GIVE [place] A CALL AT [phone].   THE PATIENT IS ENCOURAGED TO PRACTICE SOCIAL DISTANCING DUE TO THE COVID-19 PANDEMIC.

## 2022-12-14 NOTE — Patient Instructions (Signed)
Hypertension, Adult ?Hypertension is another name for high blood pressure. High blood pressure forces your heart to work harder to pump blood. This can cause problems over time. ?There are two numbers in a blood pressure reading. There is a top number (systolic) over a bottom number (diastolic). It is best to have a blood pressure that is below 120/80. ?What are the causes? ?The cause of this condition is not known. Some other conditions can lead to high blood pressure. ?What increases the risk? ?Some lifestyle factors can make you more likely to develop high blood pressure: ?Smoking. ?Not getting enough exercise or physical activity. ?Being overweight. ?Having too much fat, sugar, calories, or salt (sodium) in your diet. ?Drinking too much alcohol. ?Other risk factors include: ?Having any of these conditions: ?Heart disease. ?Diabetes. ?High cholesterol. ?Kidney disease. ?Obstructive sleep apnea. ?Having a family history of high blood pressure and high cholesterol. ?Age. The risk increases with age. ?Stress. ?What are the signs or symptoms? ?High blood pressure may not cause symptoms. Very high blood pressure (hypertensive crisis) may cause: ?Headache. ?Fast or uneven heartbeats (palpitations). ?Shortness of breath. ?Nosebleed. ?Vomiting or feeling like you may vomit (nauseous). ?Changes in how you see. ?Very bad chest pain. ?Feeling dizzy. ?Seizures. ?How is this treated? ?This condition is treated by making healthy lifestyle changes, such as: ?Eating healthy foods. ?Exercising more. ?Drinking less alcohol. ?Your doctor may prescribe medicine if lifestyle changes do not help enough and if: ?Your top number is above 130. ?Your bottom number is above 80. ?Your personal target blood pressure may vary. ?Follow these instructions at home: ?Eating and drinking ? ?If told, follow the DASH eating plan. To follow this plan: ?Fill one half of your plate at each meal with fruits and vegetables. ?Fill one fourth of your plate  at each meal with whole grains. Whole grains include whole-wheat pasta, brown rice, and whole-grain bread. ?Eat or drink low-fat dairy products, such as skim milk or low-fat yogurt. ?Fill one fourth of your plate at each meal with low-fat (lean) proteins. Low-fat proteins include fish, chicken without skin, eggs, beans, and tofu. ?Avoid fatty meat, cured and processed meat, or chicken with skin. ?Avoid pre-made or processed food. ?Limit the amount of salt in your diet to less than 1,500 mg each day. ?Do not drink alcohol if: ?Your doctor tells you not to drink. ?You are pregnant, may be pregnant, or are planning to become pregnant. ?If you drink alcohol: ?Limit how much you have to: ?0-1 drink a day for women. ?0-2 drinks a day for men. ?Know how much alcohol is in your drink. In the [place] one drink equals one 12 oz bottle of beer (355 mL), one 5 oz glass of wine (148 mL), or one 1? oz glass of hard liquor (44 mL). ?Lifestyle ? ?Work with your doctor to stay at a healthy weight or to lose weight. Ask your doctor what the best weight is for you. ?Get at least 30 minutes of exercise that causes your heart to beat faster (aerobic exercise) most days of the week. This may include walking, swimming, or biking. ?Get at least 30 minutes of exercise that strengthens your muscles (resistance exercise) at least 3 days a week. This may include lifting weights or doing Pilates. ?Do not smoke or use any products that contain nicotine or tobacco. If you need help quitting, ask your doctor. ?Check your blood pressure at home as told by your doctor. ?Keep all follow-up visits. ?Medicines ?Take over-the-counter and prescription medicines   only as told by your doctor. Follow directions carefully. ?Do not skip doses of blood pressure medicine. The medicine does not work as well if you skip doses. Skipping doses also puts you at risk for problems. ?Ask your doctor about side effects or reactions to medicines that you should watch  for. ?Contact a doctor if: ?You think you are having a reaction to the medicine you are taking. ?You have headaches that keep coming back. ?You feel dizzy. ?You have swelling in your ankles. ?You have trouble with your vision. ?Get help right away if: ?You get a very bad headache. ?You start to feel mixed up (confused). ?You feel weak or numb. ?You feel faint. ?You have very bad pain in your: ?Chest. ?Belly (abdomen). ?You vomit more than once. ?You have trouble breathing. ?These symptoms may be an emergency. Get help right away. Call 911. ?Do not wait to see if the symptoms will go away. ?Do not drive yourself to the hospital. ?Summary ?Hypertension is another name for high blood pressure. ?High blood pressure forces your heart to work harder to pump blood. ?For most people, a normal blood pressure is less than 120/80. ?Making healthy choices can help lower blood pressure. If your blood pressure does not get lower with healthy choices, you may need to take medicine. ?This information is not intended to replace advice given to you by your health care provider. Make sure you discuss any questions you have with your health care provider. ?Document Revised: 05/21/2021 Document Reviewed: 05/21/2021 ?Elsevier Patient Education ? 2023 Elsevier Inc. ? ?

## 2022-12-15 LAB — CMP14+EGFR
ALT: 29 IU/L (ref 0–44)
AST: 24 IU/L (ref 0–40)
Albumin/Globulin Ratio: 1.8 (ref 1.2–2.2)
Albumin: 4.7 g/dL (ref 3.8–4.8)
Alkaline Phosphatase: 198 IU/L — ABNORMAL HIGH (ref 44–121)
BUN/Creatinine Ratio: 12 (ref 10–24)
BUN: 14 mg/dL (ref 8–27)
Bilirubin Total: 0.6 mg/dL (ref 0.0–1.2)
CO2: 23 mmol/L (ref 20–29)
Calcium: 10.2 mg/dL (ref 8.6–10.2)
Chloride: 101 mmol/L (ref 96–106)
Creatinine, Ser: 1.15 mg/dL (ref 0.76–1.27)
Globulin, Total: 2.6 g/dL (ref 1.5–4.5)
Glucose: 97 mg/dL (ref 70–99)
Potassium: 4.1 mmol/L (ref 3.5–5.2)
Sodium: 141 mmol/L (ref 134–144)
Total Protein: 7.3 g/dL (ref 6.0–8.5)
eGFR: 65 mL/min/{1.73_m2} (ref >59)

## 2022-12-15 LAB — CBC
Hematocrit: 48.2 % (ref 37.5–51.0)
Hemoglobin: 15.9 g/dL (ref 13.0–17.7)
MCH: 28.8 pg (ref 26.6–33.0)
MCHC: 33 g/dL (ref 31.5–35.7)
MCV: 87 fL (ref 79–97)
Platelets: 232 10*3/uL (ref 150–450)
RBC: 5.52 x10E6/uL (ref 4.14–5.80)
RDW: 13.7 % (ref 11.6–15.4)
WBC: 6.1 10*3/uL (ref 3.4–10.8)

## 2022-12-15 LAB — MICROALBUMIN / CREATININE URINE RATIO
Creatinine, Urine: 102.5 mg/dL
Microalb/Creat Ratio: 18 mg/g creat (ref 0–29)
Microalbumin, Urine: 18.4 ug/mL

## 2022-12-15 LAB — HEMOGLOBIN A1C
Est. average glucose Bld gHb Est-mCnc: 123 mg/dL
Hgb A1c MFr Bld: 5.9 % — ABNORMAL HIGH (ref 4.8–5.6)

## 2022-12-17 ENCOUNTER — Other Ambulatory Visit: Payer: Self-pay | Admitting: Nurse Practitioner

## 2022-12-19 ENCOUNTER — Other Ambulatory Visit: Payer: Self-pay | Admitting: Nurse Practitioner

## 2023-01-24 ENCOUNTER — Other Ambulatory Visit: Payer: Self-pay | Admitting: Nurse Practitioner

## 2023-02-06 ENCOUNTER — Other Ambulatory Visit: Payer: Self-pay | Admitting: Nurse Practitioner

## 2023-02-06 DIAGNOSIS — I10 Essential (primary) hypertension: Secondary | ICD-10-CM

## 2023-04-20 NOTE — Progress Notes (Signed)
Madelaine Bhat, CMA,acting as a Neurosurgeon for Arnette Felts, FNP.,have documented all relevant documentation on the behalf of Arnette Felts, FNP,as directed by  Arnette Felts, FNP while in the presence of Arnette Felts, FNP.  Subjective:   Patient ID: Kevin Weiss , male    DOB: 12-Apr-1941 , 82 y.o.   MRN: [id number]  Chief Complaint  Patient presents with   Annual Exam    HPI  Patient presents today for HM, Patient reports compliance with medications. Patient denies any Chest pain, SOB, and headaches. Patient reports he is having constipation. He has had cataract surgery to both eyes since his last visit. He has been to the dentist for an implant and next appt in December for the tooth placement. He is taking his iron supplement twice a day.   He stopped the linzess and used an enema not much relief. He is having pain to his left lower quadrant. He has not had a good bowel movement in 5 days.    BP Readings from Last 3 Encounters: 04/21/23 : 130/70 12/14/22 : 124/72 11/30/22 : (!) 143/86    Hypertension This is a chronic problem. The current episode started more than 1 year ago. The problem is uncontrolled. Pertinent negatives include no headaches. There are no associated agents to hypertension. Risk factors for coronary artery disease include obesity and sedentary lifestyle. There are no compliance problems.  There is no history of chronic renal disease.  Constipation This is a chronic problem. The current episode started more than 1 year ago. The problem has been gradually worsening since onset. His stool frequency is 1 time per day. The patient is on a high fiber diet. He Does not exercise regularly. There has Not been adequate water intake. Associated symptoms include abdominal pain. Risk factors include obesity. He has tried fiber for the symptoms.     Past Medical History:  Diagnosis Date   Alkaline phosphatase raised 11/22/2022   Arthritis    Blood transfusion without reported  diagnosis    Cardiomegaly 10/13/2006   Qualifier: Diagnosis of  By: Abundio Miu     CHF, EJECTION FRACTION > OR = 50% 10/13/2006   Qualifier: Diagnosis of  By: Abundio Miu     Degenerative joint disease (DJD) of hip 03/12/2014   GASTROESOPHAGEAL REFLUX, NO ESOPHAGITIS 10/13/2006   patient denies   HEMORRHOIDS, NOS 10/13/2006   Qualifier: Diagnosis of  By: Abundio Miu     History of colon polyps    Hypertension    takes Amlodipine and Prinizide daily   IMPOTENCE, 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   Qualifier: Diagnosis of  By: Abundio Miu     Morbid obesity (HCC) 11/03/2015   OSA on CPAP 11/03/2015   Retrognathia 11/03/2015   Sleep apnea      Family History  Problem Relation Age of Onset   Hypertension Mother    Hyperlipidemia Mother    Alzheimer's disease Mother    Cancer Mother        type unbknown   Heart disease Father    Hyperlipidemia Father    Hypertension Father    Hypertension Sister    Hypertension Brother    Heart attack Maternal Grandmother    Sleep apnea Neg Hx      Current Outpatient Medications:    aspirin 81 MG tablet, Take 81 mg by mouth daily., Disp: , Rfl:    Cholecalciferol (VITAMIN D-3) 1000 UNITS CAPS, Take 1 capsule by mouth daily., Disp: , Rfl:    ferrous sulfate  325 (65 FE) MG tablet, Take 1 tablet by mouth 2 times daily., Disp: 180 tablet, Rfl: 1   glycerin adult 2 g suppository, Place 1 suppository rectally as needed for constipation., Disp: 12 suppository, Rfl: 0   hydrochlorothiazide (MICROZIDE) 12.5 MG capsule, TAKE 1 CAPSULE BY MOUTH EVERY DAY, Disp: 90 capsule, Rfl: 1   hydrocortisone 2.5 % ointment, APPLY TOPICALLY TWICE A DAY, Disp: 20 g, Rfl: 1   LINZESS 145 MCG CAPS capsule, TAKE 1 CAPSULE BY MOUTH DAILY BEFORE BREAKFAST., Disp: 90 capsule, Rfl: 2   lisinopril (ZESTRIL) 10 MG tablet, TAKE 1 TABLET BY MOUTH EVERY DAY, Disp: 90 tablet, Rfl: 0   Multiple Vitamins-Minerals (CENTRUM SILVER ADULT 50+) TABS, Take 1 tablet by mouth  daily., Disp: , Rfl:    rosuvastatin (CRESTOR) 10 MG tablet, TAKE 1 TABLET BY MOUTH ON MONDAY, WEDNESDAY AND FRIDAYS, Disp: 36 tablet, Rfl: 4   sildenafil (VIAGRA) 100 MG tablet, Take 100 mg by mouth daily as needed., Disp: , Rfl:    Vibegron (GEMTESA) 75 MG TABS, Take by mouth., Disp: , Rfl:    No Known Allergies   Men's preventive visit. Patient Health Questionnaire (PHQ-2) is  Flowsheet Row Office Visit from 04/21/2023 in [facility]  PHQ-2 Total Score 1      Patient is on a Regular diet.  Exercising a little bit. He is using a machine to move his legs.  Marital status: Married. Relevant history for alcohol use is:  Social History   Substance and Sexual Activity  Alcohol Use Not Currently   Comment: social once every 3-4 months   Relevant history for tobacco use is:  Social History   Tobacco Use  Smoking Status Former   Current packs/day: 0.00   Average packs/day: 0.3 packs/day for 10.0 years (2.5 ttl pk-yrs)   Types: Cigarettes   Start date: 31   Quit date: 1982   Years since quitting: 42.7  Smokeless Tobacco Never  Tobacco Comments   quit 42 years  .   Review of Systems  Constitutional: Negative.   HENT: Negative.    Eyes: Negative.   Respiratory: Negative.    Cardiovascular: Negative.   Gastrointestinal:  Positive for abdominal pain and constipation (x 5 days was last BM).  Endocrine: Negative.   Genitourinary: Negative.   Musculoskeletal: Negative.   Skin: Negative.   Allergic/Immunologic: Negative.   Neurological:  Negative for headaches.  Hematological: Negative.   Psychiatric/Behavioral: Negative.       Today's Vitals   04/21/23 1120 04/21/23 1130  BP: (!) 150/80 130/70  Pulse: 85   Temp: 98.5 F (36.9 C)   TempSrc: Oral   Weight: 253 lb 12.8 oz (115.1 kg)   Height: 5\' 6"  (1.676 m)   PainSc: 3    PainLoc: Abdomen    Body mass index is 40.96 kg/m.  Wt Readings from Last 3 Encounters:  04/21/23 253 lb 12.8  oz (115.1 kg)  12/14/22 252 lb 3.2 oz (114.4 kg)  11/30/22 250 lb (113.4 kg)    Objective:  Physical Exam Vitals reviewed.  Constitutional:      General: He is not in acute distress.    Appearance: Normal appearance. He is obese.  HENT:     Head: Normocephalic and atraumatic.     Right Ear: Tympanic membrane, ear canal and external ear normal. There is no impacted cerumen.     Left Ear: Tympanic membrane, ear canal and external ear normal. There is no impacted cerumen.  Nose: Nose normal.     Mouth/Throat:     Mouth: Mucous membranes are moist.  Eyes:     General:        Right eye: No discharge.     Extraocular Movements: Extraocular movements intact.     Pupils: Pupils are equal, round, and reactive to light.  Cardiovascular:     Rate and Rhythm: Normal rate and regular rhythm.     Pulses: Normal pulses.     Heart sounds: Normal heart sounds. No murmur heard. Pulmonary:     Effort: Pulmonary effort is normal. No respiratory distress.     Breath sounds: Normal breath sounds. No wheezing.  Abdominal:     General: Abdomen is flat. Bowel sounds are normal. There is no distension.     Palpations: Abdomen is soft. There is no mass.     Tenderness: There is no abdominal tenderness.  Genitourinary:    Comments: N/A - past age of checking prostate Musculoskeletal:        General: Normal range of motion.     Cervical back: Normal range of motion and neck supple.     Right lower leg: No edema.     Left lower leg: No edema.  Skin:    General: Skin is warm and dry.     Capillary Refill: Capillary refill takes less than 2 seconds.  Neurological:     General: No focal deficit present.     Mental Status: He is alert and oriented to person, place, and time.     Cranial Nerves: No cranial nerve deficit.     Motor: No weakness.  Psychiatric:        Mood and Affect: Mood normal.        Behavior: Behavior normal.        Thought Content: Thought content normal.        Judgment:  Judgment normal.         Assessment And Plan:    Encounter for annual health examination Assessment & Plan: Behavior modifications discussed and diet history reviewed.   Pt will continue to exercise regularly and modify diet with low GI, plant based foods and decrease intake of processed foods.  Recommend intake of daily multivitamin, Vitamin D, and calcium.  Recommend for preventive screenings, as well as recommend immunizations that include influenza, TDAP, and Shingles    Morbid obesity with BMI of 40.0-44.9, adult (HCC)  Hypertensive heart disease with chronic diastolic congestive heart failure (HCC) Assessment & Plan: Blood pressure is elevated, repeat was improved.  Encouraged to focus on low-salt diet and lifestyle changes.  Orders: -     EKG 12-Lead -     POCT URINALYSIS DIP (CLINITEK) -     Microalbumin / creatinine urine ratio -     CMP14+EGFR  Prediabetes Assessment & Plan: Lab Results  Component Value Date   HGBA1C 5.9 (H) 12/14/2022     Orders: -     Hemoglobin A1c  Mixed hyperlipidemia Assessment & Plan: Cholesterol levels are stable.  Continue statin, tolerating well.  Orders: -     CMP14+EGFR -     Lipid panel  Other iron deficiency anemia Assessment & Plan: Will recheck levels if normal will stop his iron supplement.  Orders: -     Iron, TIBC and Ferritin Panel  Need for influenza vaccination Assessment & Plan: Influenza vaccine administered Encouraged to take Tylenol as needed for fever or muscle aches.   Orders: -     Flu  Vaccine Trivalent High Dose (Fluad)  Other long term (current) drug therapy -     CBC with Differential/Platelet  Chronic idiopathic constipation Assessment & Plan: He is advised to take a glycerin suppository daily until he has a bowel movement.  Will hold his Linzess until he has a bowel movement.  Advised to take a stool softener every day and to hold his iron supplement.  Encouraged to increase his water  intake.   OSA on CPAP  Other orders -     Glycerin (Adult); Place 1 suppository rectally as needed for constipation.  Dispense: 12 suppository; Refill: 0     No follow-ups on file. Patient was given opportunity to ask questions. Patient verbalized understanding of the plan and was able to repeat key elements of the plan. All questions were answered to their satisfaction.   Arnette Felts, FNP  I, Arnette Felts, FNP, have reviewed all documentation for this visit. The documentation on 04/21/23 for the exam, diagnosis, procedures, and orders are all accurate and complete.

## 2023-04-21 ENCOUNTER — Ambulatory Visit: Payer: Medicare HMO | Admitting: Nurse Practitioner

## 2023-04-21 ENCOUNTER — Encounter: Payer: Self-pay | Admitting: Nurse Practitioner

## 2023-04-21 VITALS — BP 130/70 | HR 85 | Temp 98.5°F | Ht 66.0 in | Wt 253.8 lb

## 2023-04-21 DIAGNOSIS — K5904 Chronic idiopathic constipation: Secondary | ICD-10-CM

## 2023-04-21 DIAGNOSIS — Z0000 Encounter for general adult medical examination without abnormal findings: Secondary | ICD-10-CM | POA: Diagnosis not present

## 2023-04-21 DIAGNOSIS — E782 Mixed hyperlipidemia: Secondary | ICD-10-CM

## 2023-04-21 DIAGNOSIS — I10 Essential (primary) hypertension: Secondary | ICD-10-CM

## 2023-04-21 DIAGNOSIS — G4733 Obstructive sleep apnea (adult) (pediatric): Secondary | ICD-10-CM

## 2023-04-21 DIAGNOSIS — Z23 Encounter for immunization: Secondary | ICD-10-CM

## 2023-04-21 DIAGNOSIS — I5032 Chronic diastolic (congestive) heart failure: Secondary | ICD-10-CM

## 2023-04-21 DIAGNOSIS — Z5189 Encounter for other specified aftercare: Secondary | ICD-10-CM | POA: Insufficient documentation

## 2023-04-21 DIAGNOSIS — R7303 Prediabetes: Secondary | ICD-10-CM | POA: Diagnosis not present

## 2023-04-21 DIAGNOSIS — Z79899 Other long term (current) drug therapy: Secondary | ICD-10-CM

## 2023-04-21 DIAGNOSIS — Z6841 Body Mass Index (BMI) 40.0 and over, adult: Secondary | ICD-10-CM

## 2023-04-21 DIAGNOSIS — D508 Other iron deficiency anemias: Secondary | ICD-10-CM

## 2023-04-21 DIAGNOSIS — I110 Hypertensive heart disease with heart failure: Secondary | ICD-10-CM | POA: Diagnosis not present

## 2023-04-21 DIAGNOSIS — I509 Heart failure, unspecified: Secondary | ICD-10-CM | POA: Insufficient documentation

## 2023-04-21 HISTORY — DX: Mixed hyperlipidemia: E78.2

## 2023-04-21 HISTORY — DX: Encounter for immunization: Z23

## 2023-04-21 LAB — POCT URINALYSIS DIP (CLINITEK)
Bilirubin, UA: NEGATIVE
Blood, UA: NEGATIVE
Glucose, UA: NEGATIVE mg/dL
Ketones, POC UA: NEGATIVE mg/dL
Leukocytes, UA: NEGATIVE
Nitrite, UA: NEGATIVE
POC PROTEIN,UA: NEGATIVE
Spec Grav, UA: 1.025 (ref 1.010–1.025)
Urobilinogen, UA: 2 U/dL — AB
pH, UA: 6 (ref 5.0–8.0)

## 2023-04-21 MED ORDER — GLYCERIN (ADULT) 2 G RE SUPP: 1.0000 | RECTAL | 0 refills | Status: DC | PRN

## 2023-04-21 NOTE — Assessment & Plan Note (Signed)
Will recheck levels if normal will stop his iron supplement.

## 2023-04-21 NOTE — Assessment & Plan Note (Signed)
He is advised to take a glycerin suppository daily until he has a bowel movement.  Will hold his Linzess until he has a bowel movement.  Advised to take a stool softener every day and to hold his iron supplement.  Encouraged to increase his water intake.

## 2023-04-21 NOTE — Assessment & Plan Note (Signed)
Lab Results  Component Value Date   HGBA1C 5.9 (H) 12/14/2022

## 2023-04-21 NOTE — Assessment & Plan Note (Signed)
Cholesterol levels are stable.  Continue statin, tolerating well.

## 2023-04-21 NOTE — Assessment & Plan Note (Signed)
 Behavior modifications discussed and diet history reviewed.   Pt will continue to exercise regularly and modify diet with low GI, plant based foods and decrease intake of processed foods.  Recommend intake of daily multivitamin, Vitamin D, and calcium.  Recommend for preventive screenings, as well as recommend immunizations that include influenza, TDAP, and Shingles

## 2023-04-21 NOTE — Assessment & Plan Note (Signed)
Blood pressure is elevated, repeat was improved.  Encouraged to focus on low-salt diet and lifestyle changes.

## 2023-04-22 LAB — LIPID PANEL
Chol/HDL Ratio: 3.1 ratio (ref 0.0–5.0)
Cholesterol, Total: 130 mg/dL (ref 100–199)
HDL: 42 mg/dL (ref >39)
LDL Chol Calc (NIH): 75 mg/dL (ref 0–99)
Triglycerides: 62 mg/dL (ref 0–149)
VLDL Cholesterol Cal: 13 mg/dL (ref 5–40)

## 2023-04-22 LAB — CBC WITH DIFFERENTIAL/PLATELET
Basophils Absolute: 0 10*3/uL (ref 0.0–0.2)
Basos: 1 %
EOS (ABSOLUTE): 0.3 10*3/uL (ref 0.0–0.4)
Eos: 5 %
Hematocrit: 45.7 % (ref 37.5–51.0)
Hemoglobin: 15.3 g/dL (ref 13.0–17.7)
Immature Grans (Abs): 0 10*3/uL (ref 0.0–0.1)
Immature Granulocytes: 0 %
Lymphocytes Absolute: 2.2 10*3/uL (ref 0.7–3.1)
Lymphs: 38 %
MCH: 29 pg (ref 26.6–33.0)
MCHC: 33.5 g/dL (ref 31.5–35.7)
MCV: 87 fL (ref 79–97)
Monocytes Absolute: 0.6 10*3/uL (ref 0.1–0.9)
Monocytes: 11 %
Neutrophils Absolute: 2.5 10*3/uL (ref 1.4–7.0)
Neutrophils: 45 %
Platelets: 227 10*3/uL (ref 150–450)
RBC: 5.28 x10E6/uL (ref 4.14–5.80)
RDW: 13.6 % (ref 11.6–15.4)
WBC: 5.7 10*3/uL (ref 3.4–10.8)

## 2023-04-22 LAB — IRON,TIBC AND FERRITIN PANEL
Ferritin: 428 ng/mL — ABNORMAL HIGH (ref 30–400)
Iron Saturation: 24 % (ref 15–55)
Iron: 82 ug/dL (ref 38–169)
Total Iron Binding Capacity: 345 ug/dL (ref 250–450)
UIBC: 263 ug/dL (ref 111–343)

## 2023-04-22 LAB — CMP14+EGFR
ALT: 39 IU/L (ref 0–44)
AST: 34 IU/L (ref 0–40)
Albumin: 4.6 g/dL (ref 3.8–4.8)
Alkaline Phosphatase: 166 IU/L — ABNORMAL HIGH (ref 44–121)
BUN/Creatinine Ratio: 13 (ref 10–24)
BUN: 15 mg/dL (ref 8–27)
Bilirubin Total: 0.5 mg/dL (ref 0.0–1.2)
CO2: 24 mmol/L (ref 20–29)
Calcium: 10.4 mg/dL — ABNORMAL HIGH (ref 8.6–10.2)
Chloride: 98 mmol/L (ref 96–106)
Creatinine, Ser: 1.12 mg/dL (ref 0.76–1.27)
Globulin, Total: 2.8 g/dL (ref 1.5–4.5)
Glucose: 97 mg/dL (ref 70–99)
Potassium: 3.9 mmol/L (ref 3.5–5.2)
Sodium: 139 mmol/L (ref 134–144)
Total Protein: 7.4 g/dL (ref 6.0–8.5)
eGFR: 67 mL/min/{1.73_m2} (ref >59)

## 2023-04-22 LAB — MICROALBUMIN / CREATININE URINE RATIO
Creatinine, Urine: 144.1 mg/dL
Microalb/Creat Ratio: 22 mg/g{creat} (ref 0–29)
Microalbumin, Urine: 31.8 ug/mL

## 2023-04-22 LAB — HEMOGLOBIN A1C
Est. average glucose Bld gHb Est-mCnc: 126 mg/dL
Hgb A1c MFr Bld: 6 % — ABNORMAL HIGH (ref 4.8–5.6)

## 2023-05-08 NOTE — Assessment & Plan Note (Signed)
Influenza vaccine administered Encouraged to take Tylenol as needed for fever or muscle aches.

## 2023-06-05 ENCOUNTER — Other Ambulatory Visit: Payer: Self-pay | Admitting: Nurse Practitioner

## 2023-06-12 ENCOUNTER — Other Ambulatory Visit: Payer: Self-pay | Admitting: Nurse Practitioner

## 2023-06-12 DIAGNOSIS — D508 Other iron deficiency anemias: Secondary | ICD-10-CM

## 2023-06-17 ENCOUNTER — Other Ambulatory Visit: Payer: Self-pay | Admitting: Nurse Practitioner

## 2023-08-15 ENCOUNTER — Other Ambulatory Visit: Payer: Self-pay | Admitting: Nurse Practitioner

## 2023-08-15 DIAGNOSIS — I10 Essential (primary) hypertension: Secondary | ICD-10-CM

## 2023-08-31 ENCOUNTER — Ambulatory Visit: Payer: Medicare HMO | Admitting: Adult Health

## 2023-09-08 ENCOUNTER — Other Ambulatory Visit: Payer: Self-pay | Admitting: Nurse Practitioner

## 2023-10-05 ENCOUNTER — Ambulatory Visit: Payer: Medicare HMO

## 2023-10-05 DIAGNOSIS — Z0000 Encounter for general adult medical examination without abnormal findings: Secondary | ICD-10-CM

## 2023-10-05 NOTE — Progress Notes (Signed)
Subjective:   Kevin Weiss is a 83 y.o. male who presents for Medicare Annual/Subsequent preventive examination.  Visit Complete: Virtual I connected with  Elvera Lennox on 10/05/23 by a audio enabled telemedicine application and verified that I am speaking with the correct person using two identifiers. Interactive audio and video telecommunications were attempted between this provider and patient, however failed, due to patient having technical difficulties OR patient did not have access to video capability.  We continued and completed visit with audio only.  Patient Location: Home  Provider Location: Home Office  I discussed the limitations of evaluation and management by telemedicine. The patient expressed understanding and agreed to proceed.  Vital Signs: Because this visit was a virtual/telehealth visit, some criteria may be missing or patient reported. Any vitals not documented were not able to be obtained and vitals that have been documented are patient reported.    Cardiac Risk Factors include: advanced age (>45men, >108 women);hypertension;male gender     Objective:    Today's Vitals   There is no height or weight on file to calculate BMI.     10/05/2023    2:44 PM 09/09/2022    2:15 PM 09/03/2021    9:18 AM 11/12/2020   11:16 AM 03/20/2020   10:15 AM 03/08/2019   11:14 AM 05/05/2018   11:53 AM  Advanced Directives  Does Patient Have a Medical Advance Directive? Yes Yes Yes Yes Yes Yes Yes  Type of Estate agent of [place];Living will Healthcare Power of [place];Living will Healthcare Power of [place];Living will Healthcare Power of [place];Living will Healthcare Power of [place];Living will Healthcare Power of [place];Living will Living will  Does patient want to make changes to medical advance directive?      No - Patient declined   Copy of Healthcare Power of Attorney in Chart? No - copy requested No - copy requested No - copy requested  No - copy requested No - copy requested No - copy requested     Current Medications (verified) Outpatient Encounter Medications as of 10/05/2023  Medication Sig   aspirin 81 MG tablet Take 81 mg by mouth daily.   Cholecalciferol (VITAMIN D-3) 1000 UNITS CAPS Take 1 capsule by mouth daily.   ferrous sulfate 325 (65 FE) MG tablet TAKE 1 TABLET BY MOUTH TWICE A DAY   hydrochlorothiazide (MICROZIDE) 12.5 MG capsule TAKE 1 CAPSULE BY MOUTH EVERY DAY   hydrocortisone 2.5 % ointment APPLY TO AFFECTED AREA TWICE A DAY   LINZESS 145 MCG CAPS capsule TAKE 1 CAPSULE BY MOUTH EVERY DAY BEFORE BREAKFAST   lisinopril (ZESTRIL) 10 MG tablet TAKE 1 TABLET BY MOUTH EVERY DAY   Multiple Vitamins-Minerals (CENTRUM SILVER ADULT 50+) TABS Take 1 tablet by mouth daily.   rosuvastatin (CRESTOR) 10 MG tablet TAKE 1 TABLET BY MOUTH ON MONDAY, WEDNESDAY AND FRIDAYS   sildenafil (VIAGRA) 100 MG tablet Take 100 mg by mouth daily as needed.   Vibegron (GEMTESA) 75 MG TABS Take by mouth.   glycerin adult 2 g suppository Place 1 suppository rectally as needed for constipation. (Patient not taking: Reported on 10/05/2023)   No facility-administered encounter medications on file as of 10/05/2023.    Allergies (verified) Patient has no known allergies.   History: Past Medical History:  Diagnosis Date   Alkaline phosphatase raised 11/22/2022   Arthritis    Blood transfusion without reported diagnosis    Cardiomegaly 10/13/2006   Qualifier: Diagnosis of  By: Abundio Miu  CHF, EJECTION FRACTION > OR = 50% 10/13/2006   Qualifier: Diagnosis of  By: Abundio Miu     Degenerative joint disease (DJD) of hip 03/12/2014   GASTROESOPHAGEAL REFLUX, NO ESOPHAGITIS 10/13/2006   patient denies   HEMORRHOIDS, NOS 10/13/2006   Qualifier: Diagnosis of  By: Abundio Miu     History of colon polyps    Hypertension    takes Amlodipine and Prinizide daily   IMPOTENCE, 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   Qualifier: Diagnosis of   By: Abundio Miu     Morbid obesity (HCC) 11/03/2015   OSA on CPAP 11/03/2015   Retrognathia 11/03/2015   Sleep apnea    Past Surgical History:  Procedure Laterality Date   COLONOSCOPY     JOINT REPLACEMENT     KNEE ARTHROSCOPY Left 15+yrs ago   TOTAL HIP ARTHROPLASTY Right 03/12/2014   Procedure: TOTAL HIP ARTHROPLASTY ANTERIOR APPROACH;  Surgeon: Velna Ochs, MD;  Location: [facility] OR;  Service: Orthopedics;  Laterality: Right;   TOTAL HIP ARTHROPLASTY Left 04/11/2018   Procedure: TOTAL HIP ARTHROPLASTY ANTERIOR APPROACH;  Surgeon: Marcene Corning, MD;  Location: [facility] OR;  Service: Orthopedics;  Laterality: Left;   Family History  Problem Relation Age of Onset   Hypertension Mother    Hyperlipidemia Mother    Alzheimer's disease Mother    Cancer Mother        type unbknown   Heart disease Father    Hyperlipidemia Father    Hypertension Father    Hypertension Sister    Hypertension Brother    Heart attack Maternal Grandmother    Sleep apnea Neg Hx    Social History   Socioeconomic History   Marital status: Married    Spouse name: Not on file   Number of children: 0   Years of education: Not on file   Highest education level: 12th grade  Occupational History   Occupation: retired  Tobacco Use   Smoking status: Former    Current packs/day: 0.00    Average packs/day: 0.3 packs/day for 10.0 years (2.5 ttl pk-yrs)    Types: Cigarettes    Start date: 38    Quit date: 1982    Years since quitting: 43.1   Smokeless tobacco: Never   Tobacco comments:    quit 42 years  Vaping Use   Vaping status: Never Used  Substance and Sexual Activity   Alcohol use: Not Currently    Comment: social once every 3-4 months   Drug use: Not Currently    Types: Marijuana    Comment: 03/30/18   Sexual activity: Not Currently  Other Topics Concern   Not on file  Social History Narrative   Live at home with wife. Retired from [employer] as a Merchandiser, retail.     Social Drivers of Corporate investment banker Strain: Low Risk  (10/05/2023)   Overall Financial Resource Strain (CARDIA)    Difficulty of Paying Living Expenses: Not hard at all  Food Insecurity: No Food Insecurity (10/05/2023)   Hunger Vital Sign    Worried About Running Out of Food in the Last Year: Never true    Ran Out of Food in the Last Year: Never true  Transportation Needs: No Transportation Needs (10/05/2023)   PRAPARE - Administrator, Civil Service (Medical): No    Lack of Transportation (Non-Medical): No  Physical Activity: Inactive (10/05/2023)   Exercise Vital Sign    Days of Exercise per Week: 0 days  Minutes of Exercise per Session: 0 min  Stress: No Stress Concern Present (10/05/2023)   [employer] of Occupational Health - Occupational Stress Questionnaire    Feeling of Stress : Not at all  Social Connections: Moderately Integrated (10/05/2023)   Social Connection and Isolation Panel [NHANES]    Frequency of Communication with Friends and Family: More than three times a week    Frequency of Social Gatherings with Friends and Family: Three times a week    Attends Religious Services: 1 to 4 times per year    Active Member of Clubs or Organizations: No    Attends Banker Meetings: Never    Marital Status: Married    Tobacco Counseling Counseling given: Not Answered Tobacco comments: quit 42 years   Clinical Intake:  Pre-visit preparation completed: Yes  Pain : No/denies pain     Nutritional Risks: None Diabetes: No  How often do you need to have someone help you when you read instructions, pamphlets, or other written materials from your doctor or pharmacy?: 1 - Never  Interpreter Needed?: No  Information entered by :: NAllen LPN   Activities of Daily Living    10/05/2023    2:33 PM  In your present state of health, do you have any difficulty performing the following activities:  Hearing? 1  Comment slight hearing  deficit in right ear, getting hearing checked  Vision? 0  Difficulty concentrating or making decisions? 0  Walking or climbing stairs? 0  Dressing or bathing? 0  Doing errands, shopping? 0  Preparing Food and eating ? N  Using the Toilet? N  In the past six months, have you accidently leaked urine? Y  Comment takes gemtesa  Do you have problems with loss of bowel control? N  Managing your Medications? N  Managing your Finances? N  Housekeeping or managing your Housekeeping? N    Patient Care Team: Arnette Felts, FNP as PCP - General (General Practice) Harlan Stains, [facility] (Inactive) (Pharmacist)  Indicate any recent Medical Services you may have received from other than [facility] providers in the past year (date may be approximate).     Assessment:   This is a routine wellness examination for [place].  Hearing/Vision screen Hearing Screening - Comments:: Going to have hearing checked Vision Screening - Comments:: Regular eye exams, [facility]   Goals Addressed             This Visit's Progress    Patient Stated       10/05/2023, wants to control food intake and exercise more       Depression Screen    10/05/2023    2:48 PM 04/21/2023   11:24 AM 09/09/2022    2:16 PM 07/20/2022    2:43 PM 04/15/2022   10:56 AM 09/03/2021    9:23 AM 11/12/2020   11:18 AM  PHQ 2/9 Scores  PHQ - 2 Score 0 1 0 0 0 0 0  PHQ- 9 Score 0 2         Fall Risk    10/05/2023    2:45 PM 04/21/2023   11:23 AM 09/09/2022    2:16 PM 07/20/2022    2:43 PM 04/15/2022   10:56 AM  Fall Risk   Falls in the past year? 0 0 0 0 0  Number falls in past yr: 0 0 0 0 0  Injury with Fall? 0 0 0 0 0  Risk for fall due to : Medication side effect No  Fall Risks Medication side effect No Fall Risks No Fall Risks  Follow up Falls prevention discussed;Falls evaluation completed Falls evaluation completed Falls prevention discussed;Education provided;Falls evaluation completed Falls evaluation completed Falls  evaluation completed    MEDICARE RISK AT HOME: Medicare Risk at Home Any stairs in or around the home?: No If so, are there any without handrails?: No Home free of loose throw rugs in walkways, pet beds, electrical cords, etc?: Yes Adequate lighting in your home to reduce risk of falls?: Yes Life alert?: No Use of a cane, walker or w/c?: No Grab bars in the bathroom?: No Shower chair or bench in shower?: Yes Elevated toilet seat or a handicapped toilet?: Yes  TIMED UP AND GO:  Was the test performed?  No    Cognitive Function:        10/05/2023    2:49 PM 09/09/2022    2:19 PM 09/03/2021    9:31 AM 11/12/2020   11:21 AM 03/20/2020   10:21 AM  6CIT Screen  What Year? 0 points 0 points 0 points 0 points 4 points  What month? 0 points 0 points 0 points 0 points 0 points  What time? 0 points 0 points 0 points 3 points 0 points  Count back from 20 0 points 0 points 0 points 0 points 0 points  Months in reverse 0 points 0 points 2 points 0 points 0 points  Repeat phrase 0 points 0 points 0 points 2 points 2 points  Total Score 0 points 0 points 2 points 5 points 6 points    Immunizations Immunization History  Administered Date(s) Administered   Fluad Quad(high Dose 65+) 06/27/2020, 06/09/2021, 07/20/2022   Fluad Trivalent(High Dose 65+) 04/21/2023   Influenza, High Dose Seasonal PF 06/14/2019   Influenza-Unspecified 03/16/2014   PFIZER Comirnaty(Gray Top)Covid-19 Tri-Sucrose Vaccine 01/21/2021, 07/02/2021   PFIZER(Purple Top)SARS-COV-2 Vaccination 08/28/2019, 09/17/2019, 05/20/2020   Pfizer Covid-19 Vaccine Bivalent Booster 1yrs & up 12/18/2021, 07/21/2022   Pfizer(Comirnaty)Fall Seasonal Vaccine 12 years and older 07/21/2022   Pneumococcal Conjugate-13 06/24/2020   Pneumococcal Polysaccharide-23 03/14/2014, 04/13/2018   Td 08/16/1996   Tdap 04/21/2021   Zoster Recombinant(Shingrix) 04/21/2021, 07/02/2021    TDAP status: Up to date  Flu Vaccine status: Up to  date  Pneumococcal vaccine status: Up to date  Covid-19 vaccine status: Information provided on how to obtain vaccines.   Qualifies for Shingles Vaccine? Yes   Zostavax completed Yes   Shingrix Completed?: Yes  Screening Tests Health Maintenance  Topic Date Due   COVID-19 Vaccine (8 - 2024-25 season) 04/17/2023   Medicare Annual Wellness (AWV)  10/04/2024   DTaP/Tdap/Td (3 - Td or Tdap) 04/22/2031   Pneumonia Vaccine 52+ Years old  Completed   INFLUENZA VACCINE  Completed   Zoster Vaccines- Shingrix  Completed   HPV VACCINES  Aged Out   Colonoscopy  Discontinued   Hepatitis C Screening  Discontinued    Health Maintenance  Health Maintenance Due  Topic Date Due   COVID-19 Vaccine (8 - 2024-25 season) 04/17/2023    Colorectal cancer screening: No longer required.   Lung Cancer Screening: (Low Dose CT Chest recommended if Age 16-80 years, 20 pack-year currently smoking OR have quit w/in 15years.) does not qualify.   Lung Cancer Screening Referral: no  Additional Screening:  Hepatitis C Screening: does not qualify;   Vision Screening: Recommended annual ophthalmology exams for early detection of glaucoma and other disorders of the eye. Is the patient up to date with their annual eye  exam?  Yes  Who is the provider or what is the name of the office in which the patient attends annual eye exams? [facility] If pt is not established with a provider, would they like to be referred to a provider to establish care? No .   Dental Screening: Recommended annual dental exams for proper oral hygiene  Diabetic Foot Exam: n/a  Community Resource Referral / Chronic Care Management: CRR required this visit?  No   CCM required this visit?  No     Plan:     I have personally reviewed and noted the following in the patient's chart:   Medical and social history Use of alcohol, tobacco or illicit drugs  Current medications and supplements including opioid prescriptions.  Patient is not currently taking opioid prescriptions. Functional ability and status Nutritional status Physical activity Advanced directives List of other physicians Hospitalizations, surgeries, and ER visits in previous 12 months Vitals Screenings to include cognitive, depression, and falls Referrals and appointments  In addition, I have reviewed and discussed with patient certain preventive protocols, quality metrics, and best practice recommendations. A written personalized care plan for preventive services as well as general preventive health recommendations were provided to patient.     Barb Merino, LPN   [id number]   After Visit Summary: (MyChart) Due to this being a telephonic visit, the after visit summary with patients personalized plan was offered to patient via MyChart   Nurse Notes: none

## 2023-10-05 NOTE — Patient Instructions (Signed)
Kevin Weiss , Thank you for taking time to come for your Medicare Wellness Visit. I appreciate your ongoing commitment to your health goals. Please review the following plan we discussed and let me know if I can assist you in the future.   Referrals/Orders/Follow-Ups/Clinician Recommendations: none  This is a list of the screening recommended for you and due dates:  Health Maintenance  Topic Date Due   COVID-19 Vaccine (8 - 2024-25 season) 04/17/2023   Medicare Annual Wellness Visit  10/04/2024   DTaP/Tdap/Td vaccine (3 - Td or Tdap) 04/22/2031   Pneumonia Vaccine  Completed   Flu Shot  Completed   Zoster (Shingles) Vaccine  Completed   HPV Vaccine  Aged Out   Colon Cancer Screening  Discontinued   Hepatitis C Screening  Discontinued    Advanced directives: (Copy Requested) Please bring a copy of your health care power of attorney and living will to the office to be added to your chart at your convenience.  Next Medicare Annual Wellness Visit scheduled for next year: No, office will schedule  insert Preventive Care attachment Insert FALL PREVENTION attachment if needed

## 2023-12-06 ENCOUNTER — Telehealth: Payer: Self-pay | Admitting: Adult Health

## 2023-12-06 NOTE — Telephone Encounter (Signed)
 LVM and sent mychart msg informing pt of need to reschedule 12/08/23 appt - NP in meeting

## 2023-12-07 NOTE — Telephone Encounter (Addendum)
 Spoke with patient and was able to schedule him for tomorrow afternoon at 2:30 PM, arrival to 15.  I canceled his October appointment. Pt to bring machine & power cord to appt.

## 2023-12-07 NOTE — Telephone Encounter (Signed)
 Pt has called back and has been r/s with wait list

## 2023-12-08 ENCOUNTER — Ambulatory Visit: Payer: Medicare HMO | Admitting: Adult Health

## 2023-12-08 ENCOUNTER — Encounter: Payer: Self-pay | Admitting: Adult Health

## 2023-12-08 ENCOUNTER — Ambulatory Visit (INDEPENDENT_AMBULATORY_CARE_PROVIDER_SITE_OTHER): Admitting: Adult Health

## 2023-12-08 VITALS — BP 141/85 | HR 79 | Ht 68.0 in | Wt 252.6 lb

## 2023-12-08 DIAGNOSIS — G4733 Obstructive sleep apnea (adult) (pediatric): Secondary | ICD-10-CM

## 2023-12-08 NOTE — Telephone Encounter (Signed)
 Spoke with pt and reminded him to bring his cpap machine & power cord to his appt this afternoon.

## 2023-12-08 NOTE — Progress Notes (Signed)
 PATIENT: Kevin Weiss DOB: 05/19/1941  REASON FOR VISIT: follow up HISTORY FROM: patient PRIMARY NEUROLOGIST: Dr. Albertina Hugger  Chief Complaint  Patient presents with   Follow-up    Rm 19, alone.  No concerns. No changes medicall or surgically.      HISTORY OF PRESENT ILLNESS: Today 12/08/23:  Kevin Weiss is a 83 y.o. male with a history of OSA on CPAP. Returns today for follow-up.  Reports that CPAP is working well for him.  Denies any new issues.  Reports good benefit.  Download is below.     11/30/22: Kevin Weiss is a 83 y.o. male with a history of OSA on CPAP. Returns today for follow-up.  Reports that the new CPAP is working well.  Denies any new issues.  Continues to find it beneficial.     09/01/22: Kevin Weiss is a 83 y.o. male with a history of OSA on CPAP. Returns today for follow-up. No issues with the machine. Continues to see the benefit. Feels well rested. Doesn't require naps. Goes to bed around 1AM and wakes up at 9AM.         REVIEW OF SYSTEMS: Out of a complete 14 system review of symptoms, the patient complains only of the following symptoms, and all other reviewed systems are negative.  ESS3    ALLERGIES: No Known Allergies  HOME MEDICATIONS: Outpatient Medications Prior to Visit  Medication Sig Dispense Refill   aspirin  81 MG tablet Take 81 mg by mouth daily.     Cholecalciferol  (VITAMIN D -3) 1000 UNITS CAPS Take 1 capsule by mouth daily.     glycerin  adult 2 g suppository Place 1 suppository rectally as needed for constipation. 12 suppository 0   hydrochlorothiazide  (MICROZIDE ) 12.5 MG capsule TAKE 1 CAPSULE BY MOUTH EVERY DAY 90 capsule 1   hydrocortisone  2.5 % ointment APPLY TO AFFECTED AREA TWICE A DAY 20 g 1   LINZESS  145 MCG CAPS capsule TAKE 1 CAPSULE BY MOUTH EVERY DAY BEFORE BREAKFAST 90 capsule 2   lisinopril  (ZESTRIL ) 10 MG tablet TAKE 1 TABLET BY MOUTH EVERY DAY 90 tablet 0   Multiple Vitamins-Minerals  (CENTRUM SILVER ADULT 50+) TABS Take 1 tablet by mouth daily.     rosuvastatin  (CRESTOR ) 10 MG tablet TAKE 1 TABLET BY MOUTH ON MONDAY, WEDNESDAY AND FRIDAYS 36 tablet 4   sildenafil (VIAGRA) 100 MG tablet Take 100 mg by mouth daily as needed.     Vibegron (GEMTESA) 75 MG TABS Take by mouth.     ferrous sulfate  325 (65 FE) MG tablet TAKE 1 TABLET BY MOUTH TWICE A DAY 180 tablet 1   No facility-administered medications prior to visit.    PAST MEDICAL HISTORY: Past Medical History:  Diagnosis Date   Alkaline phosphatase raised 11/22/2022   Arthritis    Blood transfusion without reported diagnosis    Cardiomegaly 10/13/2006   Qualifier: Diagnosis of  By: Gae Jointer     CHF, EJECTION FRACTION > OR = 50% 10/13/2006   Qualifier: Diagnosis of  By: Gae Jointer     Degenerative joint disease (DJD) of hip 03/12/2014   GASTROESOPHAGEAL REFLUX, NO ESOPHAGITIS 10/13/2006   patient denies   HEMORRHOIDS, NOS 10/13/2006   Qualifier: Diagnosis of  By: Gae Jointer     History of colon polyps    Hypertension    takes Amlodipine  and Prinizide daily   IMPOTENCE, 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   Qualifier: Diagnosis of  By: Gae Jointer     Morbid  obesity (HCC) 11/03/2015   OSA on CPAP 11/03/2015   Retrognathia 11/03/2015   Sleep apnea     PAST SURGICAL HISTORY: Past Surgical History:  Procedure Laterality Date   COLONOSCOPY     JOINT REPLACEMENT     KNEE ARTHROSCOPY Left 15+yrs ago   TOTAL HIP ARTHROPLASTY Right 03/12/2014   Procedure: TOTAL HIP ARTHROPLASTY ANTERIOR APPROACH;  Surgeon: Alphonzo Ask, MD;  Location: [facility] OR;  Service: Orthopedics;  Laterality: Right;   TOTAL HIP ARTHROPLASTY Left 04/11/2018   Procedure: TOTAL HIP ARTHROPLASTY ANTERIOR APPROACH;  Surgeon: Dayne Even, MD;  Location: [facility] OR;  Service: Orthopedics;  Laterality: Left;    FAMILY HISTORY: Family History  Problem Relation Age of Onset   Hypertension Mother    Hyperlipidemia Mother     Alzheimer's disease Mother    Cancer Mother        type unbknown   Heart disease Father    Hyperlipidemia Father    Hypertension Father    Hypertension Sister    Hypertension Brother    Heart attack Maternal Grandmother    Sleep apnea Neg Hx     SOCIAL HISTORY: Social History   Socioeconomic History   Marital status: Married    Spouse name: Not on file   Number of children: 0   Years of education: Not on file   Highest education level: 12th grade  Occupational History   Occupation: retired  Tobacco Use   Smoking status: Former    Current packs/day: 0.00    Average packs/day: 0.3 packs/day for 10.0 years (2.5 ttl pk-yrs)    Types: Cigarettes    Start date: 22    Quit date: 1982    Years since quitting: 43.3   Smokeless tobacco: Never   Tobacco comments:    quit 42 years  Vaping Use   Vaping status: Never Used  Substance and Sexual Activity   Alcohol use: Not Currently    Comment: social once every 3-4 months   Drug use: Not Currently    Types: Marijuana    Comment: 03/30/18   Sexual activity: Not Currently  Other Topics Concern   Not on file  Social History Narrative   Live at home with wife. Retired from [employer] as a Merchandiser, retail.    Social Drivers of Corporate investment banker Strain: Low Risk  (10/05/2023)   Overall Financial Resource Strain (CARDIA)    Difficulty of Paying Living Expenses: Not hard at all  Food Insecurity: No Food Insecurity (10/05/2023)   Hunger Vital Sign    Worried About Running Out of Food in the Last Year: Never true    Ran Out of Food in the Last Year: Never true  Transportation Needs: No Transportation Needs (10/05/2023)   PRAPARE - Administrator, Civil Service (Medical): No    Lack of Transportation (Non-Medical): No  Physical Activity: Inactive (10/05/2023)   Exercise Vital Sign    Days of Exercise per Week: 0 days    Minutes of Exercise per Session: 0 min  Stress: No Stress Concern Present  (10/05/2023)   [employer] of Occupational Health - Occupational Stress Questionnaire    Feeling of Stress : Not at all  Social Connections: Moderately Integrated (10/05/2023)   Social Connection and Isolation Panel [NHANES]    Frequency of Communication with Friends and Family: More than three times a week    Frequency of Social Gatherings with Friends and Family: Three times a week  Attends Religious Services: 1 to 4 times per year    Active Member of Clubs or Organizations: No    Attends Banker Meetings: Never    Marital Status: Married  Catering manager Violence: Not At Risk (10/05/2023)   Humiliation, Afraid, Rape, and Kick questionnaire    Fear of Current or Ex-Partner: No    Emotionally Abused: No    Physically Abused: No    Sexually Abused: No      PHYSICAL EXAM  Vitals:   12/08/23 1433  BP: (!) 141/85  Pulse: 79  Weight: 252 lb 9.6 oz (114.6 kg)  Height: 5\' 8"  (1.727 m)   Body mass index is 38.41 kg/m.  Generalized: Well developed, in no acute distress  Chest: Lungs clear to auscultation bilaterally, abnormal cardiac sounds previous EKG shows right bundle branch block with PACs  Neurological examination  Mentation: Alert oriented to time, place, history taking. Follows all commands speech and language fluent Cranial nerve II-XII: Facial symmetry noted  DIAGNOSTIC DATA (LABS, IMAGING, TESTING) - I reviewed patient records, labs, notes, testing and imaging myself where available.  Lab Results  Component Value Date   WBC 5.7 04/21/2023   HGB 15.3 04/21/2023   HCT 45.7 04/21/2023   MCV 87 04/21/2023   PLT 227 04/21/2023      Component Value Date/Time   NA 139 04/21/2023 1219   K 3.9 04/21/2023 1219   CL 98 04/21/2023 1219   CO2 24 04/21/2023 1219   GLUCOSE 97 04/21/2023 1219   GLUCOSE 138 (H) 04/12/2018 0358   BUN 15 04/21/2023 1219   CREATININE 1.12 04/21/2023 1219   CALCIUM  10.4 (H) 04/21/2023 1219   PROT 7.4 04/21/2023 1219    ALBUMIN  4.6 04/21/2023 1219   AST 34 04/21/2023 1219   ALT 39 04/21/2023 1219   ALKPHOS 166 (H) 04/21/2023 1219   BILITOT 0.5 04/21/2023 1219   GFRNONAA 51 (L) 09/10/2020 1658   GFRAA 59 (L) 09/10/2020 1658   Lab Results  Component Value Date   CHOL 130 04/21/2023   HDL 42 04/21/2023   LDLCALC 75 04/21/2023   TRIG 62 04/21/2023   CHOLHDL 3.1 04/21/2023   Lab Results  Component Value Date   HGBA1C 6.0 (H) 04/21/2023   Lab Results  Component Value Date   VITAMINB12 815 08/05/2021   Lab Results  Component Value Date   TSH 1.750 08/05/2021      ASSESSMENT AND PLAN 83 y.o. year old male  has a past medical history of Alkaline phosphatase raised (11/22/2022), Arthritis, Blood transfusion without reported diagnosis, Cardiomegaly (10/13/2006), CHF, EJECTION FRACTION > OR = 50% (10/13/2006), Degenerative joint disease (DJD) of hip (03/12/2014), GASTROESOPHAGEAL REFLUX, NO ESOPHAGITIS (10/13/2006), HEMORRHOIDS, NOS (10/13/2006), History of colon polyps, Hypertension, IMPOTENCE, ORGANIC (10/13/2006), Morbid obesity (HCC) (11/03/2015), OSA on CPAP (11/03/2015), Retrognathia (11/03/2015), and Sleep apnea. here with:  OSA on CPAP  - CPAP compliance excellent - Good treatment of AHI  - Encourage patient to use CPAP nightly and > 4 hours each night - F/U in 1 year or sooner if needed    Clem Currier, MSN, NP-C 12/08/2023, 2:48 PM [facility] [address] [place], [place] [address] [phone]

## 2023-12-18 ENCOUNTER — Other Ambulatory Visit: Payer: Self-pay | Admitting: Nurse Practitioner

## 2024-01-06 ENCOUNTER — Other Ambulatory Visit: Payer: Self-pay | Admitting: Nurse Practitioner

## 2024-01-06 DIAGNOSIS — E782 Mixed hyperlipidemia: Secondary | ICD-10-CM

## 2024-02-09 ENCOUNTER — Other Ambulatory Visit: Payer: Self-pay | Admitting: Nurse Practitioner

## 2024-02-09 DIAGNOSIS — I10 Essential (primary) hypertension: Secondary | ICD-10-CM

## 2024-03-06 ENCOUNTER — Other Ambulatory Visit: Payer: Self-pay | Admitting: Nurse Practitioner

## 2024-03-13 ENCOUNTER — Ambulatory Visit: Payer: Self-pay | Admitting: Nurse Practitioner

## 2024-03-15 ENCOUNTER — Ambulatory Visit: Admitting: Nurse Practitioner

## 2024-03-15 ENCOUNTER — Telehealth: Payer: Self-pay | Admitting: Nurse Practitioner

## 2024-03-15 NOTE — Telephone Encounter (Signed)
Called patient no answer, LVM

## 2024-03-15 NOTE — Telephone Encounter (Unsigned)
 Copied from CRM [id number]. Topic: General - Other >> Mar 15, 2024 12:05 PM Donee H wrote: Reason for CRM: Patient was returning miss call regarding appointment being canceled for today. Patient had questions regarding the suggestion that was left on message. He asked to speak with Ciera directly. Please follow up with patient [phone]

## 2024-03-18 ENCOUNTER — Other Ambulatory Visit: Payer: Self-pay | Admitting: Nurse Practitioner

## 2024-03-20 NOTE — Telephone Encounter (Signed)
 Please call him to see if he can come in today or do virtual

## 2024-03-22 ENCOUNTER — Telehealth: Payer: Self-pay

## 2024-03-22 NOTE — Telephone Encounter (Signed)
 Copied from CRM [id number]. Topic: Referral - Question >> Mar 22, 2024 11:17 AM Charlet HERO wrote: Reason for CRM: Patient is calling to have a referral for the pain that he is having on the left side from his hip down his leg, he would like to have someone to look at so he can get some sort of relief he says it hurts in the bend of both knees , to his groin and then to his back. Patient would like for Janece to choose who she feels is best to get him assistance.  Patient called and advised to go to [facility] for [facility] pain- Kevin Biby,ma

## 2024-04-03 DIAGNOSIS — I700 Atherosclerosis of aorta: Secondary | ICD-10-CM | POA: Insufficient documentation

## 2024-04-18 ENCOUNTER — Telehealth: Payer: Self-pay | Admitting: Nurse Practitioner

## 2024-04-18 NOTE — Telephone Encounter (Signed)
 Copied from CRM [id number]. Topic: Clinical - Medication Question >> Apr 18, 2024  4:47 PM Kevin Weiss wrote: Reason for CRM: The patient shares that they are interested in using Voltaren gel to help with joint discomfort in their lower back and knees. The patient would like to know if there will be any potential interactions between this medication and their current prescriptions prior to using it. Please contact the patient further when possible

## 2024-04-20 NOTE — Telephone Encounter (Signed)
 Patient is calling back to see if his question has been answered. Would like for someone to get back to him asap.  Patient can be reached at [phone]

## 2024-04-23 NOTE — Progress Notes (Signed)
 LILLETTE Kristeen JINNY Gladis, CMA,acting as a Neurosurgeon for Gaines Ada, FNP.,have documented all relevant documentation on the behalf of Gaines Ada, FNP,as directed by  Gaines Ada, FNP while in the presence of Gaines Ada, FNP.  Subjective:   Patient ID: Kevin Weiss , male    DOB: 03/04/1941 , 83 y.o.   MRN: [id number]  Chief Complaint  Patient presents with   Annual Exam    Patient presents today for HM, Patient reports compliance with medication. Patient denies any chest pain, SOB, or headaches. Patient has no concerns today.     HPI  Discussed the use of AI scribe software for clinical note transcription with the patient, who gave verbal consent to proceed.  History of Present Illness Kevin Weiss is an 83 year old male who presents for an annual physical exam.  He experiences significant aching in the back of his knees, particularly in the mornings. An x-ray revealed arthritis in the lower left part of his back. He has been prescribed meloxicam  for pain management, but it is not very effective. He also uses pain-relieving sprays on his knees, which provide some relief.  He mentions weight gain and is interested in weight management strategies. Knee pain affects his mobility, leading him to lie down after eating, which he believes contributes to his weight gain. He wants to feel fuller to reduce food intake. He discusses dietary habits, noting that his wife cooks traditional meals, and he acknowledges the need for portion control and increased vegetable intake. He has a fondness for desserts, which he is trying to limit.  He experiences occasional chest discomfort, described as a 'little something' in the center-right of his chest, and shortness of breath depending on his level of physical activity. He has not seen a cardiologist recently, with his last visit being in 2022. He uses a CPAP machine every night for sleep apnea, with recent checkups indicating excellent adherence.  No recent  constipation issues since starting a stool softener, which has been effective. No urinary issues other than urgency when he needs to urinate. He denies leg swelling.   Past Medical History:  Diagnosis Date   Alkaline phosphatase raised 11/22/2022   Arthritis    Blood transfusion without reported diagnosis    Cardiomegaly 10/13/2006   Qualifier: Diagnosis of  By: Sharron Railing     CHF (congestive heart failure) (HCC)    CHF, EJECTION FRACTION > OR = 50% 10/13/2006   Qualifier: Diagnosis of  By: Sharron Railing     Degenerative joint disease (DJD) of hip 03/12/2014   GASTROESOPHAGEAL REFLUX, NO ESOPHAGITIS 10/13/2006   patient denies   HEMORRHOIDS, NOS 10/13/2006   Qualifier: Diagnosis of  By: Sharron Railing     History of colon polyps    Hypertension    takes Amlodipine  and Prinizide daily   IMPOTENCE, 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   Qualifier: Diagnosis of  By: Sharron Railing     Morbid obesity (HCC) 11/03/2015   OSA on CPAP 11/03/2015   Retrognathia 11/03/2015   Sleep apnea      Family History  Problem Relation Age of Onset   Hypertension Mother    Hyperlipidemia Mother    Alzheimer's disease Mother    Cancer Mother        type unbknown   Heart disease Father    Hyperlipidemia Father    Hypertension Father    Hypertension Sister    Hypertension Brother    Heart attack Maternal Grandmother    Sleep apnea Neg  Hx      Current Outpatient Medications:    aspirin  81 MG tablet, Take 81 mg by mouth daily., Disp: , Rfl:    Cholecalciferol  (VITAMIN D -3) 1000 UNITS CAPS, Take 1 capsule by mouth daily., Disp: , Rfl:    hydrochlorothiazide  (MICROZIDE ) 12.5 MG capsule, TAKE 1 CAPSULE BY MOUTH EVERY DAY, Disp: 90 capsule, Rfl: 1   hydrocortisone  2.5 % ointment, APPLY TO AFFECTED AREA TWICE A DAY, Disp: 20 g, Rfl: 1   LINZESS  145 MCG CAPS capsule, TAKE 1 CAPSULE BY MOUTH EVERY DAY BEFORE BREAKFAST, Disp: 90 capsule, Rfl: 2   lisinopril  (ZESTRIL ) 10 MG tablet, TAKE 1 TABLET BY  MOUTH EVERY DAY, Disp: 90 tablet, Rfl: 0   Multiple Vitamins-Minerals (CENTRUM SILVER ADULT 50+) TABS, Take 1 tablet by mouth daily., Disp: , Rfl:    rosuvastatin  (CRESTOR ) 10 MG tablet, TAKE 1 TABLET BY MOUTH ON MONDAY, WEDNESDAY AND FRIDAYS, Disp: 36 tablet, Rfl: 4   sildenafil (VIAGRA) 100 MG tablet, Take 100 mg by mouth daily as needed., Disp: , Rfl:    Vibegron (GEMTESA) 75 MG TABS, Take by mouth., Disp: , Rfl:    No Known Allergies   Men's preventive visit. Patient Health Questionnaire (PHQ-2) is  Flowsheet Row Office Visit from 04/24/2024 in [facility]  PHQ-2 Total Score 0  Patient is on a Regular diet; admits to needing to take control . Marital status: Married. Relevant history for alcohol use is:  Social History   Substance and Sexual Activity  Alcohol Use Not Currently   Comment: social once every 3-4 months  Relevant history for tobacco use is:  Social History   Tobacco Use  Smoking Status Former   Current packs/day: 0.00   Average packs/day: 0.3 packs/day for 11.7 years (3.0 ttl pk-yrs)   Types: Cigarettes   Start date: 59   Quit date: 1982   Years since quitting: 43.7  Smokeless Tobacco Never  Tobacco Comments   quit 42 years  .   Review of Systems  Constitutional: Negative.   HENT: Negative.    Eyes: Negative.   Respiratory: Negative.    Cardiovascular: Negative.   Gastrointestinal:  Negative for abdominal pain and constipation.  Endocrine: Negative.   Genitourinary: Negative.   Musculoskeletal:  Positive for arthralgias (bilateral knee pain).  Skin: Negative.   Allergic/Immunologic: Negative.   Neurological:  Negative for dizziness and headaches.  Hematological: Negative.   Psychiatric/Behavioral: Negative.       Today's Vitals   04/24/24 1112  BP: 110/70  Pulse: 65  Temp: 97.8 F (36.6 C)  TempSrc: Oral  Weight: 254 lb 3.2 oz (115.3 kg)  Height: 5' 8 (1.727 m)  PainSc: 4   PainLoc: Back   Body mass  index is 38.65 kg/m.  Wt Readings from Last 3 Encounters:  04/24/24 254 lb 3.2 oz (115.3 kg)  12/08/23 252 lb 9.6 oz (114.6 kg)  04/21/23 253 lb 12.8 oz (115.1 kg)    Objective:  Physical Exam Vitals and nursing note reviewed.  Constitutional:      General: He is not in acute distress.    Appearance: Normal appearance. He is obese.  HENT:     Head: Normocephalic and atraumatic.     Right Ear: Tympanic membrane, ear canal and external ear normal. There is no impacted cerumen.     Left Ear: Tympanic membrane, ear canal and external ear normal. There is no impacted cerumen.     Nose: Nose normal.  Mouth/Throat:     Mouth: Mucous membranes are moist.  Eyes:     General:        Right eye: No discharge.     Extraocular Movements: Extraocular movements intact.     Pupils: Pupils are equal, round, and reactive to light.  Cardiovascular:     Rate and Rhythm: Normal rate and regular rhythm.     Pulses: Normal pulses.     Heart sounds: Normal heart sounds. No murmur heard. Pulmonary:     Effort: Pulmonary effort is normal. No respiratory distress.     Breath sounds: Normal breath sounds. No wheezing.  Abdominal:     General: Abdomen is flat. Bowel sounds are normal. There is no distension.     Palpations: Abdomen is soft. There is no mass.     Tenderness: There is no abdominal tenderness.  Genitourinary:    Comments: N/A - past age of checking prostate Musculoskeletal:        General: Normal range of motion.     Cervical back: Normal range of motion and neck supple.     Right lower leg: No edema.     Left lower leg: No edema.  Skin:    General: Skin is warm and dry.     Capillary Refill: Capillary refill takes less than 2 seconds.  Neurological:     General: No focal deficit present.     Mental Status: He is alert and oriented to person, place, and time.     Cranial Nerves: No cranial nerve deficit.     Motor: No weakness.  Psychiatric:        Mood and Affect: Mood  normal.        Behavior: Behavior normal.        Thought Content: Thought content normal.        Judgment: Judgment normal.         Assessment And Plan:    Encounter for annual health examination Assessment & Plan: Routine wellness visit focused on weight management and dietary habits. - Perform laboratory tests. - Administer influenza and COVID-19 vaccinations. - Encourage portion control and increased intake of vegetables and proteins. - Reduce intake of carbohydrates and sugars.   Mixed hyperlipidemia Assessment & Plan: Cholesterol levels are stable.  Continue statin, tolerating well.  Orders: -     Hemoglobin A1c -     Lipid panel  Prediabetes  COVID-19 vaccine administered Assessment & Plan: Given in office  Orders: -     Pfizer Comirnaty Covid-19 Vaccine 51yrs & older  Need for influenza vaccination Assessment & Plan: Influenza vaccine administered Encouraged to take Tylenol  as needed for fever or muscle aches.   Orders: -     Flu vaccine HIGH DOSE PF(Fluzone Trivalent)  Other long term (current) drug therapy -     CBC with Differential/Platelet  Hypertensive heart disease with chronic diastolic congestive heart failure (HCC) Assessment & Plan: EKG showed irregularities with early beats. No recent cardiology follow-up since 2022. Reported intermittent chest pain and occasional shortness of breath. - Refer to cardiology for evaluation.  Orders: -     EKG 12-Lead -     Microalbumin / creatinine urine ratio -     POCT URINALYSIS DIP (CLINITEK) -     CMP14+EGFR -     Ambulatory referral to Cardiology -     Brain natriuretic peptide  Other iron deficiency anemia Assessment & Plan: Will recheck levels if normal will stop his iron supplement.  OSA on CPAP Assessment & Plan: Severe sleep apnea confirmed by recent sleep study in 2017.  CPAP compliance excellent. Potential for weight loss medication if sleep apnea is moderate to severe.   Chronic  idiopathic constipation Assessment & Plan: Constipation improved with stool softener. - Continue stool softener. - Monitor for constipation if weight loss medication is initiated.   Chronic pain of both knees Assessment & Plan: Pain management with meloxicam  and pain-relieving sprays. Physical therapy scheduled to begin. MRI considered if physical therapy is ineffective. - Initiate physical therapy. - Continue meloxicam  for pain management. - Use pain-relieving sprays as needed.   Hematuria, unspecified type -     Urine Culture  Class 2 severe obesity due to excess calories with serious comorbidity and body mass index (BMI) of 38.0 to 38.9 in adult [facility]) Assessment & Plan: Discussed challenges with weight management. Potential for weekly injection for weight loss if moderate to severe sleep apnea is confirmed. Insurance coverage challenges for weight loss medications and potential cardiology involvement for approval. - Research eligibility for weight loss medication based on sleep apnea diagnosis. - Encourage dietary changes focusing on portion control and increased intake of vegetables and proteins. - Reduce intake of carbohydrates and sugars.      Return for 1 year physical, 6 month bp check. Patient was given opportunity to ask questions. Patient verbalized understanding of the plan and was able to repeat key elements of the plan. All questions were answered to their satisfaction.   Gaines Ada, FNP  I, Gaines Ada, FNP, have reviewed all documentation for this visit. The documentation on 04/24/24 for the exam, diagnosis, procedures, and orders are all accurate and complete.

## 2024-04-24 ENCOUNTER — Encounter: Payer: Self-pay | Admitting: Nurse Practitioner

## 2024-04-24 ENCOUNTER — Ambulatory Visit (INDEPENDENT_AMBULATORY_CARE_PROVIDER_SITE_OTHER): Payer: Medicare HMO | Admitting: Nurse Practitioner

## 2024-04-24 VITALS — BP 110/70 | HR 65 | Temp 97.8°F | Ht 68.0 in | Wt 254.2 lb

## 2024-04-24 DIAGNOSIS — R319 Hematuria, unspecified: Secondary | ICD-10-CM

## 2024-04-24 DIAGNOSIS — E782 Mixed hyperlipidemia: Secondary | ICD-10-CM | POA: Diagnosis not present

## 2024-04-24 DIAGNOSIS — I10 Essential (primary) hypertension: Secondary | ICD-10-CM

## 2024-04-24 DIAGNOSIS — R7303 Prediabetes: Secondary | ICD-10-CM

## 2024-04-24 DIAGNOSIS — G8929 Other chronic pain: Secondary | ICD-10-CM

## 2024-04-24 DIAGNOSIS — Z23 Encounter for immunization: Secondary | ICD-10-CM

## 2024-04-24 DIAGNOSIS — E66812 Obesity, class 2: Secondary | ICD-10-CM

## 2024-04-24 DIAGNOSIS — I5032 Chronic diastolic (congestive) heart failure: Secondary | ICD-10-CM | POA: Diagnosis not present

## 2024-04-24 DIAGNOSIS — D508 Other iron deficiency anemias: Secondary | ICD-10-CM

## 2024-04-24 DIAGNOSIS — I110 Hypertensive heart disease with heart failure: Secondary | ICD-10-CM

## 2024-04-24 DIAGNOSIS — Z79899 Other long term (current) drug therapy: Secondary | ICD-10-CM

## 2024-04-24 DIAGNOSIS — Z6838 Body mass index (BMI) 38.0-38.9, adult: Secondary | ICD-10-CM

## 2024-04-24 DIAGNOSIS — Z0000 Encounter for general adult medical examination without abnormal findings: Secondary | ICD-10-CM | POA: Diagnosis not present

## 2024-04-24 DIAGNOSIS — M25561 Pain in right knee: Secondary | ICD-10-CM

## 2024-04-24 DIAGNOSIS — K5904 Chronic idiopathic constipation: Secondary | ICD-10-CM

## 2024-04-24 DIAGNOSIS — G4733 Obstructive sleep apnea (adult) (pediatric): Secondary | ICD-10-CM

## 2024-04-24 DIAGNOSIS — M25562 Pain in left knee: Secondary | ICD-10-CM

## 2024-04-24 HISTORY — DX: Encounter for immunization: Z23

## 2024-04-24 HISTORY — DX: Other chronic pain: G89.29

## 2024-04-24 LAB — POCT URINALYSIS DIP (CLINITEK)
Bilirubin, UA: NEGATIVE
Glucose, UA: NEGATIVE mg/dL
Ketones, POC UA: NEGATIVE mg/dL
Nitrite, UA: NEGATIVE
POC PROTEIN,UA: >=300 — AB
Spec Grav, UA: >=1.03 — AB (ref 1.010–1.025)
Urobilinogen, UA: 0.2 U/dL
pH, UA: 5.5 (ref 5.0–8.0)

## 2024-04-25 LAB — CBC WITH DIFFERENTIAL/PLATELET
Basophils Absolute: 0 x10E3/uL (ref 0.0–0.2)
Basos: 1 %
EOS (ABSOLUTE): 0.5 x10E3/uL — ABNORMAL HIGH (ref 0.0–0.4)
Eos: 9 %
Hematocrit: 48.6 % (ref 37.5–51.0)
Hemoglobin: 15.8 g/dL (ref 13.0–17.7)
Immature Grans (Abs): 0 x10E3/uL (ref 0.0–0.1)
Immature Granulocytes: 0 %
Lymphocytes Absolute: 2.2 x10E3/uL (ref 0.7–3.1)
Lymphs: 39 %
MCH: 29.1 pg (ref 26.6–33.0)
MCHC: 32.5 g/dL (ref 31.5–35.7)
MCV: 90 fL (ref 79–97)
Monocytes Absolute: 0.5 x10E3/uL (ref 0.1–0.9)
Monocytes: 9 %
Neutrophils Absolute: 2.4 x10E3/uL (ref 1.4–7.0)
Neutrophils: 42 %
Platelets: 203 x10E3/uL (ref 150–450)
RBC: 5.43 x10E6/uL (ref 4.14–5.80)
RDW: 13.8 % (ref 11.6–15.4)
WBC: 5.6 x10E3/uL (ref 3.4–10.8)

## 2024-04-25 LAB — MICROALBUMIN / CREATININE URINE RATIO
Creatinine, Urine: 249.9 mg/dL
Microalb/Creat Ratio: 400 mg/g{creat} — ABNORMAL HIGH (ref 0–29)
Microalbumin, Urine: 1000.3 ug/mL

## 2024-04-25 LAB — LIPID PANEL
Chol/HDL Ratio: 2.7 ratio (ref 0.0–5.0)
Cholesterol, Total: 137 mg/dL (ref 100–199)
HDL: 50 mg/dL (ref >39)
LDL Chol Calc (NIH): 72 mg/dL (ref 0–99)
Triglycerides: 75 mg/dL (ref 0–149)
VLDL Cholesterol Cal: 15 mg/dL (ref 5–40)

## 2024-04-25 LAB — CMP14+EGFR
ALT: 61 IU/L — ABNORMAL HIGH (ref 0–44)
AST: 40 IU/L (ref 0–40)
Albumin: 4.8 g/dL (ref 3.8–4.8)
Alkaline Phosphatase: 199 IU/L — ABNORMAL HIGH (ref 44–121)
BUN/Creatinine Ratio: 15 (ref 10–24)
BUN: 15 mg/dL (ref 8–27)
Bilirubin Total: 0.7 mg/dL (ref 0.0–1.2)
CO2: 22 mmol/L (ref 20–29)
Calcium: 10.3 mg/dL — ABNORMAL HIGH (ref 8.6–10.2)
Chloride: 100 mmol/L (ref 96–106)
Creatinine, Ser: 1.02 mg/dL (ref 0.76–1.27)
Globulin, Total: 2.7 g/dL (ref 1.5–4.5)
Glucose: 88 mg/dL (ref 70–99)
Potassium: 4.2 mmol/L (ref 3.5–5.2)
Sodium: 141 mmol/L (ref 134–144)
Total Protein: 7.5 g/dL (ref 6.0–8.5)
eGFR: 74 mL/min/1.73 (ref >59)

## 2024-04-25 LAB — HEMOGLOBIN A1C
Est. average glucose Bld gHb Est-mCnc: 114 mg/dL
Hgb A1c MFr Bld: 5.6 % (ref 4.8–5.6)

## 2024-04-25 LAB — BRAIN NATRIURETIC PEPTIDE: BNP: 25.3 pg/mL (ref 0.0–100.0)

## 2024-04-27 LAB — URINE CULTURE

## 2024-05-02 DIAGNOSIS — Z6838 Body mass index (BMI) 38.0-38.9, adult: Secondary | ICD-10-CM

## 2024-05-02 HISTORY — DX: Body mass index (BMI) 38.0-38.9, adult: Z68.38

## 2024-05-02 NOTE — Assessment & Plan Note (Signed)
 Discussed challenges with weight management. Potential for weekly injection for weight loss if moderate to severe sleep apnea is confirmed. Insurance coverage challenges for weight loss medications and potential cardiology involvement for approval. - Research eligibility for weight loss medication based on sleep apnea diagnosis. - Encourage dietary changes focusing on portion control and increased intake of vegetables and proteins. - Reduce intake of carbohydrates and sugars.

## 2024-05-06 ENCOUNTER — Ambulatory Visit: Payer: Self-pay | Admitting: Nurse Practitioner

## 2024-05-06 DIAGNOSIS — G4730 Sleep apnea, unspecified: Secondary | ICD-10-CM

## 2024-05-06 MED ORDER — ZEPBOUND 2.5 MG/0.5ML ~~LOC~~ SOAJ: 2.5000 mg | SUBCUTANEOUS | 0 refills | Status: DC

## 2024-05-06 NOTE — Assessment & Plan Note (Signed)
 Severe sleep apnea confirmed by recent sleep study in 2017.  CPAP compliance excellent. Potential for weight loss medication if sleep apnea is moderate to severe.

## 2024-05-06 NOTE — Assessment & Plan Note (Signed)
Cholesterol levels are stable.  Continue statin, tolerating well.

## 2024-05-06 NOTE — Assessment & Plan Note (Signed)
 Routine wellness visit focused on weight management and dietary habits. - Perform laboratory tests. - Administer influenza and COVID-19 vaccinations. - Encourage portion control and increased intake of vegetables and proteins. - Reduce intake of carbohydrates and sugars.

## 2024-05-06 NOTE — Assessment & Plan Note (Signed)
 Pain management with meloxicam  and pain-relieving sprays. Physical therapy scheduled to begin. MRI considered if physical therapy is ineffective. - Initiate physical therapy. - Continue meloxicam  for pain management. - Use pain-relieving sprays as needed.

## 2024-05-06 NOTE — Patient Instructions (Signed)
 Health Maintenance  Topic Date Due   Medicare Annual Wellness Visit  10/04/2024   COVID-19 Vaccine (9 - Pfizer risk 2024-25 season) 10/22/2024   DTaP/Tdap/Td vaccine (3 - Td or Tdap) 04/22/2031   Pneumococcal Vaccine for age over 68  Completed   Flu Shot  Completed   Zoster (Shingles) Vaccine  Completed   HPV Vaccine  Aged Out   Meningitis B Vaccine  Aged Out   Colon Cancer Screening  Discontinued   Hepatitis C Screening  Discontinued

## 2024-05-06 NOTE — Assessment & Plan Note (Signed)
 Constipation improved with stool softener. - Continue stool softener. - Monitor for constipation if weight loss medication is initiated.

## 2024-05-06 NOTE — Assessment & Plan Note (Signed)
 Influenza vaccine administered Encouraged to take Tylenol as needed for fever or muscle aches.

## 2024-05-06 NOTE — Assessment & Plan Note (Signed)
 EKG showed irregularities with early beats. No recent cardiology follow-up since 2022. Reported intermittent chest pain and occasional shortness of breath. - Refer to cardiology for evaluation.

## 2024-05-06 NOTE — Assessment & Plan Note (Signed)
Will recheck levels if normal will stop his iron supplement.

## 2024-05-06 NOTE — Assessment & Plan Note (Signed)
Given in office

## 2024-05-07 DIAGNOSIS — G5702 Lesion of sciatic nerve, left lower limb: Secondary | ICD-10-CM | POA: Insufficient documentation

## 2024-05-07 DIAGNOSIS — M545 Low back pain, unspecified: Secondary | ICD-10-CM | POA: Insufficient documentation

## 2024-05-08 ENCOUNTER — Other Ambulatory Visit: Payer: Self-pay | Admitting: Nurse Practitioner

## 2024-05-08 DIAGNOSIS — R748 Abnormal levels of other serum enzymes: Secondary | ICD-10-CM

## 2024-05-09 ENCOUNTER — Telehealth: Payer: Self-pay

## 2024-05-09 NOTE — Telephone Encounter (Signed)
 Copied from CRM #[id number]. Topic: Clinical - Prescription Issue >> May 09, 2024  9:40 AM Wess RAMAN wrote: Reason for CRM: Patient states tirzepatide  (ZEPBOUND ) 2.5 MG/0.5ML Pen is $2000 and he does not have that kind of money and would like an alternative called in  Callback #: [id number]  Pharmacy: [id number] - [place], [place] - [address] [place] [address] Phone: [phone] Fax: [phone] Hours: Not open 24 hours

## 2024-05-09 NOTE — Telephone Encounter (Signed)
 Patient called to ask if he should take Amoxi per last OV note, does not appear to have been ordered.  Copied from CRM #[id number]. Topic: Clinical - Medical Advice >> May 09, 2024  9:42 AM Wess RAMAN wrote: Reason for CRM: Patient states he had a UTI during his last office visit and was instructed to take Amoxicillin for 5 days. He would like to know if Georgina Speaks, FNP would still like him to do that.  Callback #: [phone]

## 2024-05-15 ENCOUNTER — Telehealth: Payer: Self-pay

## 2024-05-15 ENCOUNTER — Other Ambulatory Visit: Payer: Self-pay

## 2024-05-15 NOTE — Telephone Encounter (Signed)
 Copied from CRM #[id number]. Topic: Clinical - Medication Question >> May 14, 2024 10:24 AM Willma SAUNDERS wrote: Reason for CRM: Patient calling to check on his request for a prescriptions for  augmentin. Per the encounter on 05/06/24 Np Georgina advised would prescribe but has never gotten. Patient would like it to be sent to:  [id number] [place], [place] - [address] [place] [address] Phone: [phone] Fax: [phone]  Patient can be reached at [phone]

## 2024-05-22 ENCOUNTER — Other Ambulatory Visit: Payer: Self-pay | Admitting: Nurse Practitioner

## 2024-05-22 DIAGNOSIS — N3000 Acute cystitis without hematuria: Secondary | ICD-10-CM

## 2024-05-22 MED ORDER — AMOXICILLIN-POT CLAVULANATE 875-125 MG PO TABS: 1.0000 | ORAL_TABLET | Freq: Two times a day (BID) | ORAL | 0 refills | Status: DC

## 2024-05-24 ENCOUNTER — Other Ambulatory Visit: Payer: Self-pay | Admitting: Nurse Practitioner

## 2024-05-24 DIAGNOSIS — E66812 Obesity, class 2: Secondary | ICD-10-CM

## 2024-05-28 ENCOUNTER — Ambulatory Visit: Admitting: Adult Health

## 2024-06-05 ENCOUNTER — Other Ambulatory Visit: Payer: Self-pay | Admitting: Nurse Practitioner

## 2024-06-20 ENCOUNTER — Other Ambulatory Visit: Payer: Self-pay | Admitting: Nurse Practitioner

## 2024-07-20 ENCOUNTER — Ambulatory Visit: Admitting: Gastroenterology

## 2024-08-04 ENCOUNTER — Other Ambulatory Visit: Payer: Self-pay | Admitting: Nurse Practitioner

## 2024-08-04 DIAGNOSIS — I10 Essential (primary) hypertension: Secondary | ICD-10-CM

## 2024-08-21 ENCOUNTER — Ambulatory Visit (INDEPENDENT_AMBULATORY_CARE_PROVIDER_SITE_OTHER): Admitting: Nurse Practitioner

## 2024-08-21 ENCOUNTER — Other Ambulatory Visit: Payer: Self-pay | Admitting: Nurse Practitioner

## 2024-08-21 VITALS — BP 130/66 | HR 95 | Temp 98.1°F | Ht 68.0 in | Wt 255.0 lb

## 2024-08-21 DIAGNOSIS — I110 Hypertensive heart disease with heart failure: Secondary | ICD-10-CM

## 2024-08-21 DIAGNOSIS — I517 Cardiomegaly: Secondary | ICD-10-CM

## 2024-08-21 DIAGNOSIS — I5032 Chronic diastolic (congestive) heart failure: Secondary | ICD-10-CM

## 2024-08-21 DIAGNOSIS — Z79899 Other long term (current) drug therapy: Secondary | ICD-10-CM

## 2024-08-21 DIAGNOSIS — K5904 Chronic idiopathic constipation: Secondary | ICD-10-CM | POA: Diagnosis not present

## 2024-08-21 DIAGNOSIS — E782 Mixed hyperlipidemia: Secondary | ICD-10-CM

## 2024-08-21 DIAGNOSIS — Z6838 Body mass index (BMI) 38.0-38.9, adult: Secondary | ICD-10-CM

## 2024-08-21 DIAGNOSIS — G4733 Obstructive sleep apnea (adult) (pediatric): Secondary | ICD-10-CM

## 2024-08-21 DIAGNOSIS — R7303 Prediabetes: Secondary | ICD-10-CM

## 2024-08-21 DIAGNOSIS — E66812 Obesity, class 2: Secondary | ICD-10-CM | POA: Diagnosis not present

## 2024-08-21 DIAGNOSIS — I10 Essential (primary) hypertension: Secondary | ICD-10-CM

## 2024-08-21 HISTORY — DX: Essential (primary) hypertension: I10

## 2024-08-21 MED ORDER — ZEPBOUND 5 MG/0.5ML ~~LOC~~ SOAJ: 5.0000 mg | SUBCUTANEOUS | 0 refills | Status: DC

## 2024-08-21 NOTE — Assessment & Plan Note (Addendum)
 A1c is stable, continue focusing on healthy diet and exercise as tolerated

## 2024-08-21 NOTE — Assessment & Plan Note (Signed)
 He will start zepbound  2.5 mg weekly. Discussed side effects to include nausea and constipation. He was able to get free at the end of the year. He is to notify if the cost increases significantly

## 2024-08-21 NOTE — Assessment & Plan Note (Signed)
 Managed with Linzess  and stool softeners. Occasional issues with Linzess  effectiveness. - Continue Linzess  and stool softeners as needed.

## 2024-08-21 NOTE — Patient Instructions (Signed)
 Hypertension, Adult Hypertension is another name for high blood pressure. High blood pressure forces your heart to work harder to pump blood. This can cause problems over time. There are two numbers in a blood pressure reading. There is a top number (systolic) over a bottom number (diastolic). It is best to have a blood pressure that is below 120/80. What are the causes? The cause of this condition is not known. Some other conditions can lead to high blood pressure. What increases the risk? Some lifestyle factors can make you more likely to develop high blood pressure: Smoking. Not getting enough exercise or physical activity. Being overweight. Having too much fat, sugar, calories, or salt (sodium) in your diet. Drinking too much alcohol. Other risk factors include: Having any of these conditions: Heart disease. Diabetes. High cholesterol. Kidney disease. Obstructive sleep apnea. Having a family history of high blood pressure and high cholesterol. Age. The risk increases with age. Stress. What are the signs or symptoms? High blood pressure may not cause symptoms. Very high blood pressure (hypertensive crisis) may cause: Headache. Fast or uneven heartbeats (palpitations). Shortness of breath. Nosebleed. Vomiting or feeling like you may vomit (nauseous). Changes in how you see. Very bad chest pain. Feeling dizzy. Seizures. How is this treated? This condition is treated by making healthy lifestyle changes, such as: Eating healthy foods. Exercising more. Drinking less alcohol. Your doctor may prescribe medicine if lifestyle changes do not help enough and if: Your top number is above 130. Your bottom number is above 80. Your personal target blood pressure may vary. Follow these instructions at home: Eating and drinking  If told, follow the DASH eating plan. To follow this plan: Fill one half of your plate at each meal with fruits and vegetables. Fill one fourth of your plate  at each meal with whole grains. Whole grains include whole-wheat pasta, brown rice, and whole-grain bread. Eat or drink low-fat dairy products, such as skim milk or low-fat yogurt. Fill one fourth of your plate at each meal with low-fat (lean) proteins. Low-fat proteins include fish, chicken without skin, eggs, beans, and tofu. Avoid fatty meat, cured and processed meat, or chicken with skin. Avoid pre-made or processed food. Limit the amount of salt in your diet to less than 1,500 mg each day. Do not drink alcohol if: Your doctor tells you not to drink. You are pregnant, may be pregnant, or are planning to become pregnant. If you drink alcohol: Limit how much you have to: 0-1 drink a day for women. 0-2 drinks a day for men. Know how much alcohol is in your drink. In the [place] one drink equals one 12 oz bottle of beer (355 mL), one 5 oz glass of wine (148 mL), or one 1 oz glass of hard liquor (44 mL). Lifestyle  Work with your doctor to stay at a healthy weight or to lose weight. Ask your doctor what the best weight is for you. Get at least 30 minutes of exercise that causes your heart to beat faster (aerobic exercise) most days of the week. This may include walking, swimming, or biking. Get at least 30 minutes of exercise that strengthens your muscles (resistance exercise) at least 3 days a week. This may include lifting weights or doing Pilates. Do not smoke or use any products that contain nicotine or tobacco. If you need help quitting, ask your doctor. Check your blood pressure at home as told by your doctor. Keep all follow-up visits. Medicines Take over-the-counter and prescription medicines  only as told by your doctor. Follow directions carefully. Do not skip doses of blood pressure medicine. The medicine does not work as well if you skip doses. Skipping doses also puts you at risk for problems. Ask your doctor about side effects or reactions to medicines that you should watch  for. Contact a doctor if: You think you are having a reaction to the medicine you are taking. You have headaches that keep coming back. You feel dizzy. You have swelling in your ankles. You have trouble with your vision. Get help right away if: You get a very bad headache. You start to feel mixed up (confused). You feel weak or numb. You feel faint. You have very bad pain in your: Chest. Belly (abdomen). You vomit more than once. You have trouble breathing. These symptoms may be an emergency. Get help right away. Call 911. Do not wait to see if the symptoms will go away. Do not drive yourself to the hospital. Summary Hypertension is another name for high blood pressure. High blood pressure forces your heart to work harder to pump blood. For most people, a normal blood pressure is less than 120/80. Making healthy choices can help lower blood pressure. If your blood pressure does not get lower with healthy choices, you may need to take medicine. This information is not intended to replace advice given to you by your health care provider. Make sure you discuss any questions you have with your health care provider. Document Revised: 05/21/2021 Document Reviewed: 05/21/2021 Elsevier Patient Education  2024 [employer].

## 2024-08-21 NOTE — Progress Notes (Signed)
 I,Kevin Weiss, CMA,acting as a neurosurgeon for [employer], FNP.,have documented all relevant documentation on the behalf of Kevin Ada, FNP,as directed by  Kevin Ada, FNP while in the presence of Kevin Ada, FNP.  Subjective:  Patient ID: Kevin Weiss , male    DOB: [birth year] , 85 y.o.   MRN: [id number]  Chief Complaint  Patient presents with   Hypertension    Patient presents today for a bpc. Patient reports compliance with his meds. Patient denies having chest pain, sob or headaches at this time.    HPI  Discussed the use of AI scribe software for clinical note transcription with the patient, who gave verbal consent to proceed.  History of Present Illness [name] is an 84 year old male with hypertension and hyperlipidemia who presents for a follow-up visit.  He has not experienced any changes in his condition since his last visit in September. He is currently managing his blood pressure and cholesterol levels.  In November, he consulted a urologist for urinary urgency and was prescribed Gemtesa and sildenafil. A follow-up is scheduled in one year.  He also visited an orthopedic specialist in November due to pain in his lower left back. An x-ray revealed arthritis, and he completed a course of physical therapy, which included stretches and range of motion exercises.  He recently received Zepbound , which he has not started yet. He is also taking Linzess  for bowel movements and uses stool softeners as needed.  He describes a 'funny feeling' in his chest as a dull pain. He has not seen a cardiologist since 2022 and wants to follow up due to these symptoms.  He has an upcoming appointment with a gastroenterologist on the 21st for elevated liver enzymes.  Past Medical History:  Diagnosis Date   Alkaline phosphatase raised 11/22/2022   Arthritis    Blood transfusion without reported diagnosis    Cardiomegaly 10/13/2006   Qualifier: Diagnosis of  By: Sharron Railing     CHF (congestive heart failure) (HCC)    CHF, EJECTION FRACTION > OR = 50% 10/13/2006   Qualifier: Diagnosis of  By: Sharron Railing     Degenerative joint disease (DJD) of hip 03/12/2014   GASTROESOPHAGEAL REFLUX, NO ESOPHAGITIS 10/13/2006   patient denies   HEMORRHOIDS, NOS 10/13/2006   Qualifier: Diagnosis of  By: Sharron Railing     History of colon polyps    Hypertension    takes Amlodipine  and Prinizide daily   IMPOTENCE, 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   Respiratory: Negative.    Cardiovascular: Negative.   Neurological: Negative.   Psychiatric/Behavioral: Negative.       Today's Vitals   08/21/24 1449  BP: 130/66  Pulse: 95  Temp: 98.1 F (36.7 C)  Weight: 255 lb (115.7 kg)  Height: 5' 8 (1.727 m)  PainSc: 0-No pain   Body mass index is 38.77 kg/m.  Wt Readings from Last 3 Encounters:  08/21/24 255 lb (115.7 kg)  04/24/24 254 lb 3.2 oz (115.3 kg)  12/08/23 252 lb 9.6 oz (114.6 kg)      Objective:  Physical Exam Vitals and nursing note reviewed.  Constitutional:      General: He is not in acute distress.    Appearance: Normal appearance. He is obese.  Cardiovascular:     Rate and Rhythm: Normal rate and regular rhythm.     Pulses: Normal pulses.     Heart sounds: Normal heart sounds. No murmur heard. Pulmonary:     Effort: No respiratory distress.     Breath  sounds: Normal breath sounds.  Musculoskeletal:        General: No swelling.  Skin:    General: Skin is warm and dry.     Capillary Refill: Capillary refill takes less than 2 seconds.  Neurological:     General: No focal deficit present.     Mental Status: He is alert and oriented to person, place, and time.  Psychiatric:        Mood and Affect: Mood normal.        Behavior: Behavior normal.        Thought Content: Thought content normal.        Judgment: Judgment normal.      Assessment And Plan:   Assessment & Plan Hypertensive heart disease with chronic diastolic congestive heart failure (HCC) Intermittent dull chest pain, possibly reflux-related. The patient mentioned seeing a cardiologist, Doctor [place], in 2022, but stated that he never received a follow-up appointment with him. The patient also mentioned not seeing any recent appointments with a cardiologist, and the doctor did not see any recent records of a cardiologist visit in the patient's file. - Referred to cardiology for chest pain evaluation and follow-up on heart disease and heart failure in September but has not seen them yet, new referral placed. - Instructed to contact office if no cardiology appointment within two weeks. Mixed hyperlipidemia Follow-up for blood pressure and cholesterol management since September. - Checked labs to monitor cholesterol levels. Prediabetes A1c is stable, continue focusing on healthy diet and exercise as tolerated OSA on CPAP He will start zepbound  2.5 mg weekly. Discussed side effects to include nausea and constipation. He was able to get free at the end of the year. He is to notify if the cost increases significantly Cardiac hypertrophy Refer to cardiology Chronic idiopathic constipation Managed with Linzess  and stool softeners. Occasional issues with Linzess  effectiveness. - Continue Linzess  and stool softeners as needed. Class 2 severe obesity due to excess calories with  serious comorbidity and body mass index (BMI) of 38.0 to 38.9 in adult Started Zepbound  2.5 mg injection due to severe sleep apnea. Discussed technique, site selection, side effects, and cost concerns. - Advised on injection site selection, preferring abdominal area. - First injection administered and demonstrated by CMA in office.  - next dose sent to pharmacy. If price is elevated he is to notify the office. - Ensure Linzess  and stool softeners are taken to manage constipation. - Advised to stop eating when feeling full to prevent nausea. Other long term (current) drug therapy   Orders Placed This Encounter  Procedures   CBC   CMP14+EGFR   Hemoglobin A1c   Lipid panel   Microalbumin / creatinine urine ratio  Ambulatory referral to Cardiology     Return in about 4 months (around 12/19/2024) for bpc.  Patient was given opportunity to ask questions. Patient verbalized understanding of the plan and was able to repeat key elements of the plan. All questions were answered to their satisfaction.    LILLETTE Kevin Ada, FNP, have reviewed all documentation for this visit. The documentation on 08/21/2024 for the exam, diagnosis, procedures, and orders are all accurate and complete.   IF YOU HAVE BEEN REFERRED TO A SPECIALIST, IT MAY TAKE 1-2 WEEKS TO SCHEDULE/PROCESS THE REFERRAL. IF YOU HAVE NOT HEARD FROM US /SPECIALIST IN TWO WEEKS, PLEASE GIVE US  A CALL AT [phone] X [phone].      [1]  Current Outpatient Medications:    aspirin  81 MG tablet, Take 81 mg by mouth daily., Disp: , Rfl:    Cholecalciferol  (VITAMIN D -3) 1000 UNITS CAPS, Take 1 capsule by mouth daily., Disp: , Rfl:    hydrochlorothiazide  (MICROZIDE ) 12.5 MG capsule, TAKE 1 CAPSULE BY MOUTH EVERY DAY, Disp: 90 capsule, Rfl: 1   hydrocortisone  2.5 % ointment, APPLY TO AFFECTED AREA TWICE A DAY, Disp: 20 g, Rfl: 1   LINZESS  145 MCG CAPS capsule, TAKE 1 CAPSULE BY MOUTH EVERY DAY BEFORE BREAKFAST, Disp: 90 capsule, Rfl: 2    lisinopril  (ZESTRIL ) 10 MG tablet, TAKE 1 TABLET BY MOUTH EVERY DAY, Disp: 90 tablet, Rfl: 0   Multiple Vitamins-Minerals (CENTRUM SILVER ADULT 50+) TABS, Take 1 tablet by mouth daily., Disp: , Rfl:    rosuvastatin  (CRESTOR ) 10 MG tablet, TAKE 1 TABLET BY MOUTH ON MONDAY, WEDNESDAY AND FRIDAYS, Disp: 36 tablet, Rfl: 4   sildenafil (VIAGRA) 100 MG tablet, Take 100 mg by mouth daily as needed., Disp: , Rfl:    tirzepatide  (ZEPBOUND ) 5 MG/0.5ML Pen, Inject 5 mg into the skin once a week., Disp: 6 mL, Rfl: 0   Vibegron (GEMTESA) 75 MG TABS, Take by mouth., Disp: , Rfl:  [2] No Known Allergies

## 2024-08-21 NOTE — Assessment & Plan Note (Signed)
 Started Zepbound  2.5 mg injection due to severe sleep apnea. Discussed technique, site selection, side effects, and cost concerns. - Advised on injection site selection, preferring abdominal area. - First injection administered and demonstrated by CMA in office.  - next dose sent to pharmacy. If price is elevated he is to notify the office. - Ensure Linzess  and stool softeners are taken to manage constipation. - Advised to stop eating when feeling full to prevent nausea.

## 2024-08-21 NOTE — Assessment & Plan Note (Signed)
 Intermittent dull chest pain, possibly reflux-related. The patient mentioned seeing a cardiologist, Doctor [place], in 2022, but stated that he never received a follow-up appointment with him. The patient also mentioned not seeing any recent appointments with a cardiologist, and the doctor did not see any recent records of a cardiologist visit in the patient's file. - Referred to cardiology for chest pain evaluation and follow-up on heart disease and heart failure in September but has not seen them yet, new referral placed. - Instructed to contact office if no cardiology appointment within two weeks.

## 2024-08-21 NOTE — Assessment & Plan Note (Signed)
 Follow-up for blood pressure and cholesterol management since September. - Checked labs to monitor cholesterol levels.

## 2024-08-21 NOTE — Assessment & Plan Note (Addendum)
 Refer to cardiology

## 2024-08-27 ENCOUNTER — Other Ambulatory Visit: Payer: Self-pay

## 2024-08-27 DIAGNOSIS — Z860100 Personal history of colon polyps, unspecified: Secondary | ICD-10-CM | POA: Insufficient documentation

## 2024-08-27 DIAGNOSIS — M1990 Unspecified osteoarthritis, unspecified site: Secondary | ICD-10-CM | POA: Insufficient documentation

## 2024-08-27 DIAGNOSIS — L259 Unspecified contact dermatitis, unspecified cause: Secondary | ICD-10-CM | POA: Insufficient documentation

## 2024-08-27 DIAGNOSIS — G4730 Sleep apnea, unspecified: Secondary | ICD-10-CM | POA: Insufficient documentation

## 2024-08-27 DIAGNOSIS — I10 Essential (primary) hypertension: Secondary | ICD-10-CM | POA: Insufficient documentation

## 2024-09-05 ENCOUNTER — Ambulatory Visit: Admitting: Gastroenterology

## 2024-09-05 ENCOUNTER — Encounter: Payer: Self-pay | Admitting: Gastroenterology

## 2024-09-05 ENCOUNTER — Ambulatory Visit: Payer: Self-pay | Admitting: Gastroenterology

## 2024-09-05 ENCOUNTER — Other Ambulatory Visit (INDEPENDENT_AMBULATORY_CARE_PROVIDER_SITE_OTHER)

## 2024-09-05 VITALS — BP 130/86 | HR 76 | Ht 66.0 in | Wt 250.4 lb

## 2024-09-05 DIAGNOSIS — K760 Fatty (change of) liver, not elsewhere classified: Secondary | ICD-10-CM | POA: Diagnosis not present

## 2024-09-05 DIAGNOSIS — R748 Abnormal levels of other serum enzymes: Secondary | ICD-10-CM | POA: Diagnosis not present

## 2024-09-05 DIAGNOSIS — Z860100 Personal history of colon polyps, unspecified: Secondary | ICD-10-CM

## 2024-09-05 DIAGNOSIS — K5909 Other constipation: Secondary | ICD-10-CM

## 2024-09-05 LAB — GAMMA GT: GGT: 23 U/L (ref 7–51)

## 2024-09-05 MED ORDER — LINACLOTIDE 290 MCG PO CAPS: 290.0000 ug | ORAL_CAPSULE | Freq: Every day | ORAL | 3 refills | Status: AC

## 2024-09-05 NOTE — Progress Notes (Signed)
 "  Discussed the use of AI scribe software for clinical note transcription with the patient, who gave verbal consent to proceed.  HPI : Kevin Weiss is an 84 year old male with chronic elevated alkaline phosphatase who presents for follow-up of persistently abnormal alkaline phosphatase.  I initially saw him in November 2022 for further evaluation of chronically elevated alkaline phosphatase.  Review of his chart indicated that he had had a persistent, but stable alkaline phosphatase elevation since at least 2020.  A GGT was normal and a fractionated alkaline phosphatase panel showed normal liver fraction and elevated bony fraction.  An MRCP was performed and showed no biliary dilation or other biliary abnormalities. His elevated alkaline phosphatase was not thought to be secondary to liver disease.  He was incidentally noted to have fatty liver on imaging and a subsequent elastography showed no significant fibrosis.  He rarely drinks alcohol.  Approximately three months ago, he experienced severe, diffuse body pain one morning. Emergent orthopedic evaluation revealed arthritis in the lower left back. Physical therapy and exercises have improved his symptoms. Morning stiffness persists, especially in the hamstrings and lower back, but pain is manageable with topical Voltaren and rest.  Chronic constipation is a longstanding issue. He takes Linzess  145 mcg daily, and uses stool softeners as recommended. Bowel movements occur every other day or less frequently, with occasional hard stools that are difficult to pass. He denies daily bowel movements.  He had a colonoscopy in January 2020 by Dr. Kristie. 2 subcentimeter tubular adenomas were removed from the ascending colon    Ultrasound with elastography July 13, 2021 IMPRESSION: ULTRASOUND ABDOMEN:   1. Hepatic steatosis. 2. Simple appearing liver and renal cysts.   ULTRASOUND HEPATIC ELASTOGRAPHY:   Median kPa:  2.5   Diagnostic  category:  < or = 5 kPa: high probability of being normal  MRCP July 07, 2021 IMPRESSION: No evidence of biliary ductal dilatation, choledocholithiasis, or other acute findings.   Mild-to-moderate hepatic steatosis.   Benign-appearing hepatic and renal cysts.    Component Ref Range & Units (hover) 4 mo ago (04/24/24) 1 yr ago (04/21/23) 1 yr ago (12/14/22) 2 yr ago (07/20/22) 2 yr ago (12/09/21) 3 yr ago (08/05/21) 3 yr ago (06/29/21)  Glucose 88 97 97 91 87 88   BUN 15 15 14 16 13 17    Creatinine, Ser 1.02 1.12 1.15 1.11 1.06 1.16   eGFR 74 67 65 68 72 64   BUN/Creatinine Ratio 15 13 12 14 12 15    Sodium 141 139 141 143 144 137   Potassium 4.2 3.9 4.1 4.2 4.4 3.9   Chloride 100 98 101 103 101 97   CO2 22 24 23 23 24 27    Calcium  10.3 High  10.4 High  10.2 9.8 10.0 9.9   Total Protein 7.5 7.4 7.3 7.4  7.3   Albumin  4.8 4.6 4.7 4.6  4.7 R   Globulin, Total 2.7 2.8 2.6 2.8  2.6   Bilirubin Total 0.7 0.5 0.6 0.3  0.4   Alkaline Phosphatase 199 High  166 High  198 High  153 High   211 High  222 High             Component Ref Range & Units (hover) 3 yr ago (06/29/21) 3 yr ago (04/14/21) 3 yr ago (11/12/20) 3 yr ago (09/10/20) 4 yr ago (04/08/20) 4 yr ago (10/10/19) 5 yr ago (05/15/19)  Alkaline Phosphatase 222 High  225 High  191 High  217 High  217 High  R 222 High  R 215 High  R  LIVER FRACTION 16        BONE FRACTION 80 High         INTESTINAL FRAC. 4            Component Ref Range & Units (hover) 3 yr ago  GGT 30    Past Medical History:  Diagnosis Date   Abdominal aortic atherosclerosis 04/03/2024   Abnormal echocardiogram 03/30/2018   Alkaline phosphatase raised 11/22/2022   Arthritis    Blood transfusion without reported diagnosis    Cardiac hypertrophy 11/03/2015   Cardiomegaly 10/13/2006   Qualifier: Diagnosis of  By: Sharron Railing     CHF (congestive heart failure) (HCC)    CHF, EJECTION FRACTION > OR = 50% 10/13/2006   Qualifier: Diagnosis of   By: Sharron Railing     Chronic pain of both knees 04/24/2024   Class 2 severe obesity due to excess calories with serious comorbidity and body mass index (BMI) of 38.0 to 38.9 in adult 05/02/2024   Constipation 11/22/2022   Contact dermatitis of hand 08/27/2024   COVID-19 vaccine administered 04/24/2024   Degenerative joint disease (DJD) of hip 03/12/2014   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   Essential hypertension 08/21/2024   GASTROESOPHAGEAL REFLUX, NO ESOPHAGITIS 10/13/2006   patient denies   Hemorrhoids 10/13/2006   Qualifier: Diagnosis of   By: Sharron Railing      IMO SNOMED Dx Update Oct 2024     HEMORRHOIDS, NOS 10/13/2006   Qualifier: Diagnosis of  By: Sharron Railing     History of colon polyps    History of colonic polyps 11/22/2022   IMO SNOMED Dx Update Oct 2024     Hypertension    takes Amlodipine  and Prinizide daily   IMPOTENCE, 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   Qualifier: Diagnosis of  By: Sharron Railing     Iron deficiency anemia 11/22/2022   Mixed hyperlipidemia 04/21/2023   Morbid obesity (HCC) 11/03/2015   Need for influenza vaccination 04/21/2023   Obese abdomen 07/22/2016   OSA on CPAP 11/03/2015   Pain of lumbar spine 05/07/2024   Prediabetes 02/02/2019   HGBA1C 5.9 02/01/18     Primary osteoarthritis of left hip 04/11/2018   Retrognathia 11/03/2015   Severe sleep apnea 11/03/2015   Sleep apnea      Past Surgical History:  Procedure Laterality Date   COLONOSCOPY     EYE SURGERY     JOINT REPLACEMENT     KNEE ARTHROSCOPY Left 15+yrs ago   TOTAL HIP ARTHROPLASTY Right 03/12/2014   Procedure: TOTAL HIP ARTHROPLASTY ANTERIOR APPROACH;  Surgeon: Maude KANDICE Herald, MD;  Location: [facility] OR;  Service: Orthopedics;  Laterality: Right;   TOTAL HIP ARTHROPLASTY Left 04/11/2018   Procedure: TOTAL HIP ARTHROPLASTY ANTERIOR APPROACH;  Surgeon: Herald Maude, MD;  Location: [facility] OR;  Service: Orthopedics;  Laterality: Left;   Family History  Problem Relation  Age of Onset   Hypertension Mother    Hyperlipidemia Mother    Alzheimer's disease Mother    Cancer Mother        type unbknown   Heart disease Father    Hyperlipidemia Father    Hypertension Father    Hypertension Sister    Hypertension Brother    Heart attack Maternal Grandmother    Sleep apnea Neg Hx    Social History[1] Current Outpatient Medications  Medication Sig Dispense Refill   aspirin  81 MG tablet Take  81 mg by mouth daily.     Cholecalciferol  (VITAMIN D -3) 1000 UNITS CAPS Take 1 capsule by mouth daily.     hydrochlorothiazide  (MICROZIDE ) 12.5 MG capsule TAKE 1 CAPSULE BY MOUTH EVERY DAY 90 capsule 1   hydrocortisone  2.5 % ointment APPLY TO AFFECTED AREA TWICE A DAY 20 g 1   latanoprost (XALATAN) 0.005 % ophthalmic solution Place 1 drop into both eyes at bedtime.     linaclotide  (LINZESS ) 290 MCG CAPS capsule Take 1 capsule (290 mcg total) by mouth daily before breakfast. 90 capsule 3   LINZESS  145 MCG CAPS capsule TAKE 1 CAPSULE BY MOUTH EVERY DAY BEFORE BREAKFAST 90 capsule 2   lisinopril  (ZESTRIL ) 10 MG tablet TAKE 1 TABLET BY MOUTH EVERY DAY 90 tablet 0   Multiple Vitamins-Minerals (CENTRUM SILVER ADULT 50+) TABS Take 1 tablet by mouth daily.     rosuvastatin  (CRESTOR ) 10 MG tablet TAKE 1 TABLET BY MOUTH ON MONDAY, WEDNESDAY AND FRIDAYS 36 tablet 4   sildenafil (VIAGRA) 100 MG tablet Take 100 mg by mouth daily as needed.     tirzepatide  (ZEPBOUND ) 5 MG/0.5ML Pen Inject 5 mg into the skin once a week. 6 mL 0   Vibegron (GEMTESA) 75 MG TABS Take by mouth.     No current facility-administered medications for this visit.   Allergies[2]   Review of Systems: All systems reviewed and negative except where noted in HPI.    No results found.  Physical Exam: BP 130/86   Pulse 76   Ht 5' 6 (1.676 m)   Wt 250 lb 6.4 oz (113.6 kg)   BMI 40.42 kg/m  Constitutional: Pleasant,well-developed, African-American male in no acute distress. HEENT: Normocephalic and  atraumatic. Conjunctivae are normal. No scleral icterus. Neurological: Alert and oriented to person place and time. Skin: Skin is warm and dry. No rashes noted. Psychiatric: Normal mood and affect. Behavior is normal.  CBC    Component Value Date/Time   WBC 5.6 04/24/2024 1205   WBC 10.0 04/13/2018 0451   RBC 5.43 04/24/2024 1205   RBC 3.17 (L) 04/13/2018 0451   HGB 15.8 04/24/2024 1205   HCT 48.6 04/24/2024 1205   PLT 203 04/24/2024 1205   MCV 90 04/24/2024 1205   MCH 29.1 04/24/2024 1205   MCH 28.4 04/13/2018 0451   MCHC 32.5 04/24/2024 1205   MCHC 33.2 04/13/2018 0451   RDW 13.8 04/24/2024 1205   LYMPHSABS 2.2 04/24/2024 1205   MONOABS 0.5 03/31/2018 1338   EOSABS 0.5 (H) 04/24/2024 1205   BASOSABS 0.0 04/24/2024 1205    CMP     Component Value Date/Time   NA 141 04/24/2024 1205   K 4.2 04/24/2024 1205   CL 100 04/24/2024 1205   CO2 22 04/24/2024 1205   GLUCOSE 88 04/24/2024 1205   GLUCOSE 138 (H) 04/12/2018 0358   BUN 15 04/24/2024 1205   CREATININE 1.02 04/24/2024 1205   CALCIUM  10.3 (H) 04/24/2024 1205   PROT 7.5 04/24/2024 1205   ALBUMIN  4.8 04/24/2024 1205   AST 40 04/24/2024 1205   ALT 61 (H) 04/24/2024 1205   ALKPHOS 199 (H) 04/24/2024 1205   BILITOT 0.7 04/24/2024 1205   GFRNONAA 51 (L) 09/10/2020 1658   GFRAA 59 (L) 09/10/2020 1658       Latest Ref Rng & Units 04/24/2024   12:05 PM 04/21/2023   12:19 PM 12/14/2022   11:27 AM  CBC EXTENDED  WBC 3.4 - 10.8 x10E3/uL 5.6  5.7  6.1   RBC  4.14 - 5.80 x10E6/uL 5.43  5.28  5.52   Hemoglobin 13.0 - 17.7 g/dL 84.1  84.6  84.0   HCT 37.5 - 51.0 % 48.6  45.7  48.2   Platelets 150 - 450 x10E3/uL 203  227  232   NEUT# 1.4 - 7.0 x10E3/uL 2.4  2.5    Lymph# 0.7 - 3.1 x10E3/uL 2.2  2.2        ASSESSMENT AND PLAN:  84 year old male with chronically elevated alkaline phosphatase.  Previous evaluation revealed normal GGT and elevated bony fraction of alkaline phosphatase.  Imaging of his liver with fatty change,  but no ductal dilation or other biliary abnormalities.  As previously discussed, his elevated alkaline phosphatase is most likely bony in origin, not hepatic.  Will get bone scan to assess for Paget disease which can be a source of chronic elevated alk phos.  Evaluation of chronically elevated alkaline phosphatase, likely secondary to bone disease (suspected Paget disease of bone) Chronic elevation of alkaline phosphatase with bone fraction predominance, suspicious for Paget disease or other bony source. No hepatic etiology suspected. - Ordered NM whole body bone scan to evaluate for Paget disease. - Ordered repeat GGT  Fatty liver Chronic, low-risk MASLD with normal elastography and no progression or significant fibrosis. - Ordered repeat liver elastography to assess liver stiffness and fibrosis. - Discussed the importance of diet and exercise and reversing/controlling fatty liver -Patient recently started on tirzepatide , which may be helpful  Chronic constipation Moderate chronic constipation with incomplete response to current Linzess  dose. Prefers looser stools and agreed to dose escalation. - Increased Linzess  to 290 mcg daily or instructed to take two tablets of lower dose if unable to return current prescription. - Provided guidance on possible diarrhea as a side effect of increased Linzess . - Reinforced use of stool softeners as adjunct therapy.  Colon cancer screening/history of polyps - Patient's last colonoscopy in 2020 with 2 subcentimeter adenomatous.  No known history of high risk polyps or other risk factors for colon cancer - Given patient's age and lack of high risk polyps, recommend against further colon cancer screening.  Recording duration: 16 minutes     I spent a total of 30 minutes reviewing the patient's medical record, interviewing and examining the patient, discussing his diagnosis and management of his condition going forward, and documenting in the medical  record  Kevin Weiss E. Stacia, MD [facility]   Georgina Speaks, FNP     [1]  Social History Tobacco Use   Smoking status: Former    Current packs/day: 0.00    Average packs/day: 0.3 packs/day for 11.7 years (3.0 ttl pk-yrs)    Types: Cigarettes    Start date: 59    Quit date: 66    Years since quitting: 44.0   Smokeless tobacco: Never   Tobacco comments:    quit 42 years  Vaping Use   Vaping status: Never Used  Substance Use Topics   Alcohol use: Not Currently    Comment: social once every 3-4 months   Drug use: Not Currently    Types: Marijuana    Comment: 03/30/18  [2] No Known Allergies  "

## 2024-09-05 NOTE — Progress Notes (Signed)
 Mr. Blume,  Your GGT was still normal, indicating the elevated alkaline phosphatase is not from liver damage.  Please proceed with the bone scan as discussed.

## 2024-09-05 NOTE — Patient Instructions (Signed)
 Your provider has requested that you go to the basement level for lab work before leaving today. Press B on the elevator. The lab is located at the first door on the left as you exit the elevator.  We have sent the following medications to your pharmacy for you to pick up at your convenience:  Linzess  290 mcg  You will be contacted to schedule the bone scan.  You have been scheduled for an abdominal elastography at [facility] (1st floor of hospital) on 09/13/2024 at 9:00am. Please arrive 30 minutes prior to your appointment for registration. Make certain not to have anything to eat or drink after midnight prior to your appointment. Should you need to reschedule your appointment, please contact radiology at [phone]. This test typically takes about 30 minutes to perform.  _______________________________________________________  If your blood pressure at your visit was 140/90 or greater, please contact your primary care physician to follow up on this.  _______________________________________________________  If you are age 59 or older, your body mass index should be between 23-30. Your Body mass index is 40.42 kg/m. If this is out of the aforementioned range listed, please consider follow up with your Primary Care Provider.  If you are age 57 or younger, your body mass index should be between 19-25. Your Body mass index is 40.42 kg/m. If this is out of the aformentioned range listed, please consider follow up with your Primary Care Provider.   ________________________________________________________  The [facility] providers would like to encourage you to use MYCHART to communicate with providers for non-urgent requests or questions.  Due to long hold times on the telephone, sending your provider a message by [facility] may be a faster and more efficient way to get a response.  Please allow 48 business hours for a response.  Please remember that this is for non-urgent requests.   _______________________________________________________  [facility] is using a team-based approach to care.  Your team is made up of your doctor and two to three APPS. Our APPS (Nurse Practitioners and Physician Assistants) work with your physician to ensure care continuity for you. They are fully qualified to address your health concerns and develop a treatment plan. They communicate directly with your gastroenterologist to care for you. Seeing the Advanced Practice Practitioners on your physician's team can help you by facilitating care more promptly, often allowing for earlier appointments, access to diagnostic testing, procedures, and other specialty referrals.

## 2024-09-06 DIAGNOSIS — R9431 Abnormal electrocardiogram [ECG] [EKG]: Secondary | ICD-10-CM | POA: Insufficient documentation

## 2024-09-06 NOTE — Progress Notes (Unsigned)
 "    Cardiology Office Note   Date:  09/09/2024   ID:  Kevin Weiss, Kevin Weiss [birth year], MRN [id number]  PCP:  Georgina Speaks, FNP  Cardiologist:   None Referring:  Georgina Speaks, FNP  Chief Complaint  Patient presents with   Shortness of Breath      History of Present Illness: Kevin Weiss is a 84 y.o. male who presents for who was seen previously for evaluation of chest pain.   I have not seen him since 2022.    Her EF was 50% in the past but was 60 - 65% in 2023.     It has been over 3 years since I saw him.  He is actually done pretty well.  He has conduction disturbance as described below but he has not had any presyncope or syncope.  He has had no new shortness of breath.  He might get a little dyspneic during moderate activity but this is chronic.  He actually describes more having to sigh or take a deep breath rather than having any true shortness of breath and he is not having PND or orthopnea.  He puts his CPAP on and the actually breathes well.  He was sent for possibility of chest discomfort but he really does not describe this to me.  He is not having any chest pressure, neck or arm discomfort.  He is not having any weight gain.  He is having some chronic mild left lower extremity edema that is been there for a while.  He is not having any palpitations.  I did review his primary care records.   Past Medical History:  Diagnosis Date   Abdominal aortic atherosclerosis 04/03/2024   Abnormal echocardiogram 03/30/2018   Alkaline phosphatase raised 11/22/2022   Arthritis    Blood transfusion without reported diagnosis    Cardiac hypertrophy 11/03/2015   Cardiomegaly 10/13/2006   Qualifier: Diagnosis of  By: Sharron Railing     CHF (congestive heart failure) (HCC)    CHF, EJECTION FRACTION > OR = 50% 10/13/2006   Qualifier: Diagnosis of  By: Sharron Railing     Chronic pain of both knees 04/24/2024   Class 2 severe obesity due to excess calories with serious  comorbidity and body mass index (BMI) of 38.0 to 38.9 in adult 05/02/2024   Constipation 11/22/2022   Contact dermatitis of hand 08/27/2024   COVID-19 vaccine administered 04/24/2024   Degenerative joint disease (DJD) of hip 03/12/2014   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   Essential hypertension 08/21/2024   GASTROESOPHAGEAL REFLUX, NO ESOPHAGITIS 10/13/2006   patient denies   Hemorrhoids 10/13/2006   Qualifier: Diagnosis of   By: Sharron Railing      IMO SNOMED Dx Update Oct 2024     HEMORRHOIDS, NOS 10/13/2006   Qualifier: Diagnosis of  By: Sharron Railing     History of colon polyps    History of colonic polyps 11/22/2022   IMO SNOMED Dx Update Oct 2024     Hypertension    takes Amlodipine  and Prinizide daily   IMPOTENCE, 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   Qualifier: Diagnosis of  By: Sharron Railing     Iron deficiency anemia 11/22/2022   Mixed hyperlipidemia 04/21/2023   Morbid obesity (HCC) 11/03/2015   Need for influenza vaccination 04/21/2023   Obese abdomen 07/22/2016   OSA on CPAP 11/03/2015   Pain of lumbar spine 05/07/2024   Prediabetes 02/02/2019   HGBA1C 5.9 02/01/18     Primary osteoarthritis of  left hip 04/11/2018   Retrognathia 11/03/2015   Severe sleep apnea 11/03/2015   Sleep apnea     Past Surgical History:  Procedure Laterality Date   COLONOSCOPY     EYE SURGERY     JOINT REPLACEMENT     KNEE ARTHROSCOPY Left 15+yrs ago   TOTAL HIP ARTHROPLASTY Right 03/12/2014   Procedure: TOTAL HIP ARTHROPLASTY ANTERIOR APPROACH;  Surgeon: Maude KANDICE Herald, MD;  Location: [facility] OR;  Service: Orthopedics;  Laterality: Right;   TOTAL HIP ARTHROPLASTY Left 04/11/2018   Procedure: TOTAL HIP ARTHROPLASTY ANTERIOR APPROACH;  Surgeon: Herald Maude, MD;  Location: [facility] OR;  Service: Orthopedics;  Laterality: Left;     Current Outpatient Medications  Medication Sig Dispense Refill   aspirin  81 MG tablet Take 81 mg by mouth daily.     Cholecalciferol  (VITAMIN D -3) 1000  UNITS CAPS Take 1 capsule by mouth daily.     hydrochlorothiazide  (MICROZIDE ) 12.5 MG capsule TAKE 1 CAPSULE BY MOUTH EVERY DAY 90 capsule 1   hydrocortisone  2.5 % ointment APPLY TO AFFECTED AREA TWICE A DAY 20 g 1   latanoprost (XALATAN) 0.005 % ophthalmic solution Place 1 drop into both eyes at bedtime.     linaclotide  (LINZESS ) 290 MCG CAPS capsule Take 1 capsule (290 mcg total) by mouth daily before breakfast. 90 capsule 3   lisinopril  (ZESTRIL ) 10 MG tablet TAKE 1 TABLET BY MOUTH EVERY DAY 90 tablet 0   Multiple Vitamins-Minerals (CENTRUM SILVER ADULT 50+) TABS Take 1 tablet by mouth daily.     rosuvastatin  (CRESTOR ) 10 MG tablet TAKE 1 TABLET BY MOUTH ON MONDAY, WEDNESDAY AND FRIDAYS 36 tablet 4   sildenafil (VIAGRA) 100 MG tablet Take 100 mg by mouth daily as needed.     tirzepatide  (ZEPBOUND ) 5 MG/0.5ML Pen Inject 5 mg into the skin once a week. 6 mL 0   Vibegron (GEMTESA) 75 MG TABS Take by mouth.     LINZESS  145 MCG CAPS capsule TAKE 1 CAPSULE BY MOUTH EVERY DAY BEFORE BREAKFAST (Patient not taking: Reported on 09/07/2024) 90 capsule 2   No current facility-administered medications for this visit.    Allergies:   Patient has no known allergies.    Social History:  The patient  reports that he quit smoking about 44 years ago. His smoking use included cigarettes. He started smoking about 54 years ago. He has a 3 pack-year smoking history. He has never used smokeless tobacco. He reports that he does not currently use alcohol. He reports that he does not currently use drugs after having used the following drugs: Marijuana.   Family History:  The patient's family history includes Alzheimer's disease in his mother; Cancer in his mother; Heart attack in his maternal grandmother; Heart disease in his father; Hyperlipidemia in his father and mother; Hypertension in his brother, father, mother, and sister.    ROS:  Please see the history of present illness.   Otherwise, review of systems are  positive for none.   All other systems are reviewed and negative.    PHYSICAL EXAM: VS:  BP (!) 144/76   Pulse 79   Ht 5' 7 (1.702 m)   Wt 247 lb (112 kg)   SpO2 97%   BMI 38.69 kg/m  , BMI Body mass index is 38.69 kg/m. GENERAL:  Well appearing HEENT:  Pupils equal round and reactive, fundi not visualized, oral mucosa unremarkable NECK:  No jugular venous distention, waveform within normal limits, carotid upstroke brisk and symmetric, no bruits,  no thyromegaly LYMPHATICS:  No cervical, inguinal adenopathy LUNGS:  Clear to auscultation bilaterally BACK:  No CVA tenderness CHEST:  Unremarkable HEART:  PMI not displaced or sustained,S1 and S2 within normal limits, no S3, no S4, no clicks, no rubs, no murmurs ABD:  Flat, positive bowel sounds normal in frequency in pitch, no bruits, no rebound, no guarding, no midline pulsatile mass, no hepatomegaly, no splenomegaly EXT:  2 plus pulses throughout, no edema, no cyanosis no clubbing SKIN:  No rashes no nodules NEURO:  Cranial nerves II through XII grossly intact, motor grossly intact throughout [facility]:  Cognitively intact, oriented to person place and time    EKG:  EKG Interpretation Date/Time:  Friday September 07 2024 10:36:49 EST Ventricular Rate:  79 PR Interval:  186 QRS Duration:  156 QT Interval:  412 QTC Calculation: 472 R Axis:   -63  Text Interpretation: Sinus rhythm with Premature atrial complexes with Abberant conduction Right bundle branch block Left anterior fascicular block Bifascicular block Minimal voltage criteria for LVH, may be normal variant ( R in aVL ) Septal infarct , age undetermined When compared with ECG of 01-Mar-2014 10:52, Abberant conduction is now Present Confirmed by Lavona Rattan ([id number]) on 09/07/2024 11:00:21 AM     Recent Labs: 04/24/2024: ALT 61; BNP 25.3; BUN 15; Creatinine, Ser 1.02; Hemoglobin 15.8; Platelets 203; Potassium 4.2; Sodium 141    Lipid Panel    Component Value Date/Time    CHOL 137 04/24/2024 1205   TRIG 75 04/24/2024 1205   HDL 50 04/24/2024 1205   CHOLHDL 2.7 04/24/2024 1205   LDLCALC 72 04/24/2024 1205      Wt Readings from Last 3 Encounters:  09/07/24 247 lb (112 kg)  09/05/24 250 lb 6.4 oz (113.6 kg)  08/21/24 255 lb (115.7 kg)      Other studies Reviewed: Additional studies/ records that were reviewed today include: Primary care office records. Review of the above records demonstrates:  Please see elsewhere in the note.     ASSESSMENT AND PLAN:  ABNORMAL ECHO:    He had a mildly reduced ejection fraction in the past but well-preserved previously.  He is had no new symptoms.  I would not think further imaging is indicated at this point.    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    OBESITY:   He has just started GLP-1 RA.  I encouraged diet as well for weight loss.  BRADYCARDIA: He has a conduction disturbance as above and we went over this very carefully.  He is had no presyncope or syncope and if he should have those symptoms he will contact me immediately.  CHEST PAIN/SOB: I reviewed in detail with him his symptoms.  I think his chest discomfort is really some shortness of breath and appears to be mild and nonanginal.  He is not describing any anginal symptoms but I would be happy to review if these progress.  At this point I am not suggesting further cardiovascular testing.  He needs continued primary risk reduction.  Blood pressure and lipids are good.    Current medicines are reviewed at length with the patient today.  The patient does not have concerns regarding medicines.  The following changes have been made:  no change  Labs/ tests ordered today include:   Orders Placed This Encounter  Procedures  EKG 12-Lead     Disposition:   FU with with me as needed.      Signed, Lynwood Schilling, MD  09/09/2024 11:05 AM    [facility]  "

## 2024-09-07 ENCOUNTER — Ambulatory Visit: Attending: Cardiology | Admitting: Cardiology

## 2024-09-07 ENCOUNTER — Encounter: Payer: Self-pay | Admitting: Cardiology

## 2024-09-07 VITALS — BP 144/76 | HR 79 | Ht 67.0 in | Wt 247.0 lb

## 2024-09-07 DIAGNOSIS — I10 Essential (primary) hypertension: Secondary | ICD-10-CM

## 2024-09-07 DIAGNOSIS — R9431 Abnormal electrocardiogram [ECG] [EKG]: Secondary | ICD-10-CM

## 2024-09-07 DIAGNOSIS — I5032 Chronic diastolic (congestive) heart failure: Secondary | ICD-10-CM

## 2024-09-07 NOTE — Patient Instructions (Signed)

## 2024-09-07 NOTE — Telephone Encounter (Signed)
 Lm on vm to let patient know whole body scan scheduled for 09/18/2024 at 10:00am

## 2024-09-09 ENCOUNTER — Encounter: Payer: Self-pay | Admitting: Cardiology

## 2024-09-12 ENCOUNTER — Other Ambulatory Visit: Payer: Self-pay | Admitting: Nurse Practitioner

## 2024-09-12 ENCOUNTER — Encounter: Payer: Self-pay | Admitting: Nurse Practitioner

## 2024-09-12 ENCOUNTER — Other Ambulatory Visit: Payer: Self-pay

## 2024-09-12 DIAGNOSIS — G4733 Obstructive sleep apnea (adult) (pediatric): Secondary | ICD-10-CM

## 2024-09-12 MED ORDER — ZEPBOUND 5 MG/0.5ML ~~LOC~~ SOAJ: 5.0000 mg | SUBCUTANEOUS | 0 refills | Status: DC

## 2024-09-13 ENCOUNTER — Ambulatory Visit (HOSPITAL_COMMUNITY)

## 2024-09-13 ENCOUNTER — Telehealth: Payer: Self-pay

## 2024-09-13 NOTE — Telephone Encounter (Signed)
 RX benefits verified -  PA for zepbound  sent to plan.

## 2024-09-18 ENCOUNTER — Ambulatory Visit (HOSPITAL_COMMUNITY)

## 2024-09-18 ENCOUNTER — Encounter (HOSPITAL_COMMUNITY): Payer: Self-pay

## 2024-10-22 ENCOUNTER — Ambulatory Visit: Payer: Self-pay | Admitting: Nurse Practitioner

## 2024-10-31 ENCOUNTER — Ambulatory Visit: Payer: Medicare HMO

## 2024-12-19 ENCOUNTER — Ambulatory Visit: Payer: Self-pay | Admitting: Nurse Practitioner

## 2025-04-29 ENCOUNTER — Encounter: Payer: Self-pay | Admitting: Nurse Practitioner
